# Patient Record
Sex: Female | Born: 1945 | Race: White | Hispanic: No | Marital: Married | Smoking: Former smoker
Health system: Southern US, Community
[De-identification: ages and names within clinical notes are randomized; demographics above are authoritative.]

## PROBLEM LIST (undated history)

## (undated) DIAGNOSIS — C50919 Malignant neoplasm of unspecified site of unspecified female breast: Secondary | ICD-10-CM

## (undated) DIAGNOSIS — L719 Rosacea, unspecified: Secondary | ICD-10-CM

## (undated) DIAGNOSIS — E785 Hyperlipidemia, unspecified: Secondary | ICD-10-CM

## (undated) DIAGNOSIS — M810 Age-related osteoporosis without current pathological fracture: Secondary | ICD-10-CM

## (undated) DIAGNOSIS — Z9221 Personal history of antineoplastic chemotherapy: Secondary | ICD-10-CM

## (undated) DIAGNOSIS — Z923 Personal history of irradiation: Secondary | ICD-10-CM

## (undated) HISTORY — DX: Malignant neoplasm of unspecified site of unspecified female breast: C50.919

## (undated) HISTORY — DX: Age-related osteoporosis without current pathological fracture: M81.0

## (undated) HISTORY — DX: Hyperlipidemia, unspecified: E78.5

## (undated) HISTORY — PX: TUBAL LIGATION: SHX77

---

## 1997-07-05 HISTORY — PX: SIGMOIDOSCOPY: SUR1295

## 1998-11-19 ENCOUNTER — Other Ambulatory Visit: Admission: RE | Admit: 1998-11-19 | Discharge: 1998-11-19 | Payer: Self-pay | Admitting: *Deleted

## 2000-10-21 ENCOUNTER — Other Ambulatory Visit: Admission: RE | Admit: 2000-10-21 | Discharge: 2000-10-21 | Payer: Self-pay | Admitting: *Deleted

## 2000-11-25 ENCOUNTER — Encounter: Admission: RE | Admit: 2000-11-25 | Discharge: 2000-11-25 | Payer: Self-pay | Admitting: *Deleted

## 2003-02-22 ENCOUNTER — Encounter: Payer: Self-pay | Admitting: Family Medicine

## 2003-02-22 ENCOUNTER — Encounter: Admission: RE | Admit: 2003-02-22 | Discharge: 2003-02-22 | Payer: Self-pay | Admitting: Family Medicine

## 2010-12-03 ENCOUNTER — Telehealth: Payer: Self-pay | Admitting: *Deleted

## 2010-12-03 ENCOUNTER — Encounter: Payer: Self-pay | Admitting: *Deleted

## 2010-12-03 NOTE — Telephone Encounter (Signed)
While speaking with pt's husband Jillyn Hidden, he stated since his experience with a COLON went well, his wife would like to have a screening COLON. OK with Dr Jarold Motto to schedule.

## 2011-01-25 ENCOUNTER — Ambulatory Visit (AMBULATORY_SURGERY_CENTER): Payer: Managed Care, Other (non HMO) | Admitting: *Deleted

## 2011-01-25 VITALS — Ht 64.0 in | Wt 143.0 lb

## 2011-01-25 DIAGNOSIS — Z1211 Encounter for screening for malignant neoplasm of colon: Secondary | ICD-10-CM

## 2011-01-25 MED ORDER — PEG-KCL-NACL-NASULF-NA ASC-C 100 G PO SOLR: ORAL | Status: DC

## 2011-01-26 ENCOUNTER — Encounter: Payer: Self-pay | Admitting: Gastroenterology

## 2011-02-01 ENCOUNTER — Encounter: Payer: Self-pay | Admitting: Gastroenterology

## 2011-02-01 ENCOUNTER — Ambulatory Visit (AMBULATORY_SURGERY_CENTER): Payer: Managed Care, Other (non HMO) | Admitting: Gastroenterology

## 2011-02-01 VITALS — BP 145/80 | HR 75 | Temp 98.1°F | Resp 16 | Ht 64.0 in | Wt 142.0 lb

## 2011-02-01 DIAGNOSIS — D126 Benign neoplasm of colon, unspecified: Secondary | ICD-10-CM

## 2011-02-01 DIAGNOSIS — Z1211 Encounter for screening for malignant neoplasm of colon: Secondary | ICD-10-CM

## 2011-02-01 MED ORDER — SODIUM CHLORIDE 0.9 % IV SOLN: 500.0000 mL | INTRAVENOUS | Status: DC

## 2011-02-01 NOTE — Patient Instructions (Signed)
[employer] reviewed with patient and care partner.  [facility] Discharge Instructions signed by care partner.  Impressions/Recommendations:  Polyp  Repeat colonoscopy in 5-10 years, depending on pathology.  Resume medications as you had been taking prior to your procedure.

## 2011-02-02 ENCOUNTER — Telehealth: Payer: Self-pay

## 2011-02-02 NOTE — Telephone Encounter (Signed)
Left message on answering machine. 

## 2011-02-05 ENCOUNTER — Encounter: Payer: Self-pay | Admitting: Gastroenterology

## 2013-04-17 ENCOUNTER — Other Ambulatory Visit: Payer: Self-pay | Admitting: Dermatology

## 2013-06-06 ENCOUNTER — Other Ambulatory Visit (HOSPITAL_COMMUNITY): Payer: Self-pay | Admitting: Family Medicine

## 2013-06-06 DIAGNOSIS — Z1231 Encounter for screening mammogram for malignant neoplasm of breast: Secondary | ICD-10-CM

## 2013-06-08 ENCOUNTER — Other Ambulatory Visit (HOSPITAL_COMMUNITY): Payer: Self-pay | Admitting: Family Medicine

## 2013-06-08 DIAGNOSIS — Z780 Asymptomatic menopausal state: Secondary | ICD-10-CM

## 2013-06-19 ENCOUNTER — Ambulatory Visit (HOSPITAL_COMMUNITY): Payer: Managed Care, Other (non HMO)

## 2013-06-26 ENCOUNTER — Ambulatory Visit (HOSPITAL_COMMUNITY)
Admission: RE | Admit: 2013-06-26 | Discharge: 2013-06-26 | Disposition: A | Discharge status: Home / Self Care | Payer: BC Managed Care – PPO | Source: Ambulatory Visit | Attending: Family Medicine | Admitting: Family Medicine

## 2013-06-26 DIAGNOSIS — Z1231 Encounter for screening mammogram for malignant neoplasm of breast: Secondary | ICD-10-CM | POA: Insufficient documentation

## 2013-06-26 DIAGNOSIS — Z780 Asymptomatic menopausal state: Secondary | ICD-10-CM | POA: Insufficient documentation

## 2013-06-26 DIAGNOSIS — Z13820 Encounter for screening for osteoporosis: Secondary | ICD-10-CM | POA: Insufficient documentation

## 2015-06-16 ENCOUNTER — Other Ambulatory Visit: Payer: Self-pay | Admitting: Family Medicine

## 2015-06-16 DIAGNOSIS — M810 Age-related osteoporosis without current pathological fracture: Secondary | ICD-10-CM

## 2015-07-24 ENCOUNTER — Ambulatory Visit
Admission: RE | Admit: 2015-07-24 | Discharge: 2015-07-24 | Disposition: A | Discharge status: Home / Self Care | Payer: Medicare Other | Source: Ambulatory Visit | Attending: Family Medicine | Admitting: Family Medicine

## 2015-07-24 DIAGNOSIS — M810 Age-related osteoporosis without current pathological fracture: Secondary | ICD-10-CM

## 2015-09-18 DIAGNOSIS — M8000XD Age-related osteoporosis with current pathological fracture, unspecified site, subsequent encounter for fracture with routine healing: Secondary | ICD-10-CM | POA: Insufficient documentation

## 2015-09-18 DIAGNOSIS — E7800 Pure hypercholesterolemia, unspecified: Secondary | ICD-10-CM | POA: Insufficient documentation

## 2016-01-26 ENCOUNTER — Encounter: Payer: Self-pay | Admitting: Internal Medicine

## 2016-03-02 ENCOUNTER — Encounter: Payer: Self-pay | Admitting: Internal Medicine

## 2016-03-25 ENCOUNTER — Ambulatory Visit (AMBULATORY_SURGERY_CENTER): Payer: Self-pay

## 2016-03-25 VITALS — Ht 64.0 in | Wt 129.8 lb

## 2016-03-25 DIAGNOSIS — Z860100 Personal history of colon polyps, unspecified: Secondary | ICD-10-CM

## 2016-03-25 MED ORDER — SUPREP BOWEL PREP KIT 17.5-3.13-1.6 GM/177ML PO SOLN: 1.0000 | Freq: Once | ORAL | 0 refills | Status: AC

## 2016-03-25 NOTE — Progress Notes (Signed)
No allergies to eggs or soy No past problems with anesthesia No diet meds No home oxygen  Has email and internet; declined emmi 

## 2016-04-07 ENCOUNTER — Ambulatory Visit (AMBULATORY_SURGERY_CENTER): Payer: Medicare Other | Admitting: Internal Medicine

## 2016-04-07 ENCOUNTER — Encounter: Payer: Self-pay | Admitting: Internal Medicine

## 2016-04-07 VITALS — BP 122/68 | HR 69 | Temp 97.1°F | Resp 14 | Ht 64.0 in | Wt 129.0 lb

## 2016-04-07 DIAGNOSIS — D125 Benign neoplasm of sigmoid colon: Secondary | ICD-10-CM

## 2016-04-07 DIAGNOSIS — K635 Polyp of colon: Secondary | ICD-10-CM

## 2016-04-07 DIAGNOSIS — D124 Benign neoplasm of descending colon: Secondary | ICD-10-CM | POA: Diagnosis not present

## 2016-04-07 DIAGNOSIS — Z86010 Personal history of colonic polyps: Secondary | ICD-10-CM | POA: Diagnosis not present

## 2016-04-07 MED ORDER — SODIUM CHLORIDE 0.9 % IV SOLN: 500.0000 mL | INTRAVENOUS | Status: DC

## 2016-04-07 NOTE — Progress Notes (Signed)
Called to room for pathology. 

## 2016-04-07 NOTE — Op Note (Signed)
[facility] Patient Name: Katherine Donovan Procedure Date: 04/07/2016 1:36 PM MRN: [id number] Endoscopist: Jerene Bears , MD Age: 70 Referring MD:  Date of Birth: 1945/09/01 Gender: Female Account #: [id number] Procedure:                Colonoscopy Indications:              Surveillance: Personal history of adenomatous                            polyps on last colonoscopy 5 years ago Medicines:                Monitored Anesthesia Care Procedure:                Pre-Anesthesia Assessment:                           - Prior to the procedure, a History and Physical                            was performed, and patient medications and                            allergies were reviewed. The patient's tolerance of                            previous anesthesia was also reviewed. The risks                            and benefits of the procedure and the sedation                            options and risks were discussed with the patient.                            All questions were answered, and informed consent                            was obtained. Prior Anticoagulants: The patient has                            taken no previous anticoagulant or antiplatelet                            agents. ASA Grade Assessment: II - A patient with                            mild systemic disease. After reviewing the risks                            and benefits, the patient was deemed in                            satisfactory condition to undergo the procedure.  After obtaining informed consent, the colonoscope                            was passed under direct vision. Throughout the                            procedure, the patient's blood pressure, pulse, and                            oxygen saturations were monitored continuously. The                            [id number]) was introduced through the anus                            and advanced to the the  cecum, identified by                            appendiceal orifice and ileocecal valve. The                            colonoscopy was performed without difficulty. The                            patient tolerated the procedure well. The quality                            of the bowel preparation was good. The ileocecal                            valve, appendiceal orifice, and rectum were                            photographed. Scope In: 1:38:27 PM Scope Out: 1:55:07 PM Scope Withdrawal Time: 0 hours 12 minutes 37 seconds  Total Procedure Duration: 0 hours 16 minutes 40 seconds  Findings:                 The digital rectal exam was normal.                           A 6 mm polyp was found in the descending colon. The                            polyp was sessile. The polyp was removed with a                            cold snare. Resection and retrieval were complete.                           A 3 mm polyp was found in the sigmoid colon. The                            polyp was sessile. The polyp  was removed with a                            cold snare. Resection and retrieval were complete.                           Internal hemorrhoids and hypertrophied anal                            papillae were found during retroflexion. The                            hemorrhoids were small. Complications:            No immediate complications. Estimated Blood Loss:     Estimated blood loss: none. Impression:               - One 6 mm polyp in the descending colon, removed                            with a cold snare. Resected and retrieved.                           - One 3 mm polyp in the sigmoid colon, removed with                            a cold snare. Resected and retrieved.                           - Internal hemorrhoids and hypertrophied anal                            papillae. Recommendation:           - Patient has a contact number available for                            emergencies.  The signs and symptoms of potential                            delayed complications were discussed with the                            patient. Return to normal activities tomorrow.                            Written discharge instructions were provided to the                            patient.                           - Resume previous diet.                           - Continue present medications.                           -  Await pathology results.                           - Repeat colonoscopy is recommended for                            surveillance. The colonoscopy date will be                            determined after pathology results from today's                            exam become available for review. Jerene Bears, MD 04/07/2016 2:04:50 PM This report has been signed electronically.

## 2016-04-07 NOTE — Progress Notes (Signed)
Report to PACU, RN, vss, BBS= Clear.  

## 2016-04-07 NOTE — Patient Instructions (Signed)
YOU HAD AN ENDOSCOPIC PROCEDURE TODAY AT THE [facility]:   Refer to the procedure report that was given to you for any specific questions about what was found during the examination.  If the procedure report does not answer your questions, please call your gastroenterologist to clarify.  If you requested that your care partner not be given the details of your procedure findings, then the procedure report has been included in a sealed envelope for you to review at your convenience later.  YOU SHOULD EXPECT: Some feelings of bloating in the abdomen. Passage of more gas than usual.  Walking can help get rid of the air that was put into your GI tract during the procedure and reduce the bloating. If you had a lower endoscopy (such as a colonoscopy or flexible sigmoidoscopy) you may notice spotting of blood in your stool or on the toilet paper. If you underwent a bowel prep for your procedure, you may not have a normal bowel movement for a few days.  Please Note:  You might notice some irritation and congestion in your nose or some drainage.  This is from the oxygen used during your procedure.  There is no need for concern and it should clear up in a day or so.  SYMPTOMS TO REPORT IMMEDIATELY:   Following lower endoscopy (colonoscopy or flexible sigmoidoscopy):  Excessive amounts of blood in the stool  Significant tenderness or worsening of abdominal pains  Swelling of the abdomen that is new, acute  Fever of 100F or higher   For urgent or emergent issues, a gastroenterologist can be reached at any hour by calling [phone].   DIET:  We do recommend a small meal at first, but then you may proceed to your regular diet.  Drink plenty of fluids but you should avoid alcoholic beverages for 24 hours.  ACTIVITY:  You should plan to take it easy for the rest of today and you should NOT DRIVE or use heavy machinery until tomorrow (because of the sedation medicines used during the test).     FOLLOW UP: Our staff will call the number listed on your records the next business day following your procedure to check on you and address any questions or concerns that you may have regarding the information given to you following your procedure. If we do not reach you, we will leave a message.  However, if you are feeling well and you are not experiencing any problems, there is no need to return our call.  We will assume that you have returned to your regular daily activities without incident.  If any biopsies were taken you will be contacted by phone or by letter within the next 1-3 weeks.  Please call us at [phone] if you have not heard about the biopsies in 3 weeks.    SIGNATURES/CONFIDENTIALITY: You and/or your care partner have signed paperwork which will be entered into your electronic medical record.  These signatures attest to the fact that that the information above on your After Visit Summary has been reviewed and is understood.  Full responsibility of the confidentiality of this discharge information lies with you and/or your care-partner.  Read all handouts given to you by your recovery room nurse.   Thank-you for choosing us for your healthcare needs today. 

## 2016-04-08 ENCOUNTER — Telehealth: Payer: Self-pay | Admitting: *Deleted

## 2016-04-08 NOTE — Telephone Encounter (Signed)
  Follow up Call-  Call back number 04/07/2016  Post procedure Call Back phone  # [phone]  Permission to leave phone message Yes  Some recent data might be hidden     Patient questions:  Do you have a fever, pain , or abdominal swelling? No. Pain Score  0 *  Have you tolerated food without any problems? Yes.    Have you been able to return to your normal activities? Yes.    Do you have any questions about your discharge instructions: Diet   No. Medications  No. Follow up visit  No.  Do you have questions or concerns about your Care? No.  Actions: * If pain score is 4 or above: No action needed, pain <4.

## 2016-04-16 ENCOUNTER — Encounter: Payer: Self-pay | Admitting: Internal Medicine

## 2017-05-09 ENCOUNTER — Other Ambulatory Visit: Payer: Self-pay | Admitting: Family Medicine

## 2017-05-09 DIAGNOSIS — M810 Age-related osteoporosis without current pathological fracture: Secondary | ICD-10-CM

## 2017-05-09 DIAGNOSIS — Z1231 Encounter for screening mammogram for malignant neoplasm of breast: Secondary | ICD-10-CM

## 2017-08-24 ENCOUNTER — Ambulatory Visit
Admission: RE | Admit: 2017-08-24 | Discharge: 2017-08-24 | Disposition: A | Discharge status: Home / Self Care | Payer: Medicare Other | Source: Ambulatory Visit | Attending: Family Medicine | Admitting: Family Medicine

## 2017-08-24 DIAGNOSIS — M810 Age-related osteoporosis without current pathological fracture: Secondary | ICD-10-CM

## 2017-08-24 DIAGNOSIS — Z1231 Encounter for screening mammogram for malignant neoplasm of breast: Secondary | ICD-10-CM

## 2018-01-24 ENCOUNTER — Other Ambulatory Visit: Payer: Self-pay | Admitting: Family Medicine

## 2018-01-24 ENCOUNTER — Ambulatory Visit
Admission: RE | Admit: 2018-01-24 | Discharge: 2018-01-24 | Disposition: A | Discharge status: Home / Self Care | Payer: Medicare Other | Source: Ambulatory Visit | Attending: Family Medicine | Admitting: Family Medicine

## 2018-01-24 DIAGNOSIS — M810 Age-related osteoporosis without current pathological fracture: Secondary | ICD-10-CM

## 2020-02-08 ENCOUNTER — Other Ambulatory Visit: Payer: Self-pay | Admitting: Family Medicine

## 2020-02-08 DIAGNOSIS — Z1231 Encounter for screening mammogram for malignant neoplasm of breast: Secondary | ICD-10-CM

## 2020-02-08 DIAGNOSIS — M810 Age-related osteoporosis without current pathological fracture: Secondary | ICD-10-CM

## 2020-04-01 ENCOUNTER — Other Ambulatory Visit: Payer: Self-pay

## 2020-04-01 ENCOUNTER — Encounter: Payer: Self-pay | Admitting: Cardiology

## 2020-04-01 ENCOUNTER — Ambulatory Visit: Payer: Medicare Other | Admitting: Cardiology

## 2020-04-01 VITALS — BP 150/98 | HR 73 | Resp 17 | Ht 64.0 in | Wt 124.0 lb

## 2020-04-01 DIAGNOSIS — Z87891 Personal history of nicotine dependence: Secondary | ICD-10-CM

## 2020-04-01 DIAGNOSIS — E7800 Pure hypercholesterolemia, unspecified: Secondary | ICD-10-CM

## 2020-04-01 DIAGNOSIS — Z8249 Family history of ischemic heart disease and other diseases of the circulatory system: Secondary | ICD-10-CM

## 2020-04-01 DIAGNOSIS — R072 Precordial pain: Secondary | ICD-10-CM

## 2020-04-01 NOTE — Progress Notes (Signed)
Date:  04/01/2020   ID:  Katherine Donovan, DOB [birth year], MRN [id number]  PCP:  Nickola Major, MD  Cardiologist:  Rex Kras, DO, [facility] (established care 04/01/2020)  REASON FOR CONSULT: Systolic murmur and chest pressure.   REQUESTING PHYSICIAN:  Nickola Major, MD [address] [place],  [place] [address]  Chief Complaint  Patient presents with  . Chest Pain  . Heart Murmur  . New Patient (Initial Visit)    HPI  Katherine Donovan is a 74 y.o. female who presents to the office with a chief complaint of " chest pressure." Patient's past medical history and cardiovascular risk factors include: osteoporosis, hyperlipidemia, former smoker, postmenopausal female, advance age.   She is referred to the office at the request of Nickola Major, MD for evaluation of systolic murmur and chest pressure.  Patient recently had her yearly physical with her PCP several weeks ago and mentioned symptoms of mild chest pressure.  Patient states that her last episode was about 1 month ago while she was washing dishes, lasted for couple minutes, intensity 2 out of 10, self-limited.  Symptoms are not brought on by effort related activities and does not resolve with rest.  Symptoms are associated with difficulty with breathing.   Family history of premature coronary disease (sister had a PCI in her early 6s). Another sister died in her 62s due to "poor life-style" (obesity 350 pounds, alcohol use, etc).   Denies prior history of coronary artery disease, myocardial infarction, congestive heart failure, deep venous thrombosis, pulmonary embolism, stroke, transient ischemic attack.  FUNCTIONAL STATUS: Elliptical 45mns 3-4 x per week.    ALLERGIES: No Known Allergies  MEDICATION LIST PRIOR TO VISIT: Current Meds  Medication Sig  . alendronate (FOSAMAX) 70 MG tablet Take 70 mg by mouth once a week. Take with a full glass of water on an empty stomach.  . Calcium Carbonate-Vitamin D  (CALCIUM 600+D) 600-400 MG-UNIT per tablet Take 1 tablet by mouth daily.    . CRESTOR 10 MG tablet Take 5 mg by mouth Daily.  . Glucosamine 750 MG TABS Take 750 mg by mouth daily.    . Multiple Vitamins-Minerals (MULTIVITAMIN WITH MINERALS) tablet Take 1 tablet by mouth daily.     PAST MEDICAL HISTORY: Past Medical History:  Diagnosis Date  . Hyperlipidemia   . Osteoporosis     PAST SURGICAL HISTORY: Past Surgical History:  Procedure Laterality Date  . SIGMOIDOSCOPY  1999  . TUBAL LIGATION      FAMILY HISTORY: The patient family history includes Breast cancer in her sister; Cancer in her mother; Heart attack in her father; Hypertension in her father; Obesity in her sister.  SOCIAL HISTORY:  The patient  reports that she quit smoking about 52 years ago. Her smoking use included cigarettes. She has a 0.13 pack-year smoking history. She has never used smokeless tobacco. She reports current alcohol use of about 2.0 standard drinks of alcohol per week. She reports that she does not use drugs.  REVIEW OF SYSTEMS: Review of Systems  Constitutional: Negative for chills and fever.  HENT: Negative for hoarse voice and nosebleeds.   Eyes: Negative for discharge, double vision and pain.  Cardiovascular: Positive for chest pain (see above.). Negative for claudication, dyspnea on exertion, leg swelling, near-syncope, orthopnea, palpitations, paroxysmal nocturnal dyspnea and syncope.  Respiratory: Negative for hemoptysis and shortness of breath.   Musculoskeletal: Negative for muscle cramps and myalgias.  Gastrointestinal: Negative for abdominal pain, constipation,  diarrhea, hematemesis, hematochezia, melena, nausea and vomiting.  Neurological: Negative for dizziness and light-headedness.    PHYSICAL EXAM: Vitals with BMI 04/01/2020 04/07/2016 04/07/2016  Height _0  - -  Weight 124 lbs - -  BMI 23.55 - -  Systolic 732 202 542  Diastolic 98 68 58  Pulse 73 - -   CONSTITUTIONAL:  Well-developed and well-nourished. No acute distress.  SKIN: Skin is warm and dry. No rash noted. No cyanosis. No pallor. No jaundice HEAD: Normocephalic and atraumatic.  EYES: No scleral icterus MOUTH/THROAT: Moist oral membranes.  NECK: No JVD present. No thyromegaly noted. No carotid bruits  LYMPHATIC: No visible cervical adenopathy.  CHEST Normal respiratory effort. No intercostal retractions  LUNGS: Clear to auscultation bilaterally. No stridor. No wheezes. No rales.  CARDIOVASCULAR: Regular rate and rhythm, positive H0-W2, soft systolic murmur at apex, no rubs or gallops appreciated. ABDOMINAL: No apparent ascites.  EXTREMITIES: No peripheral edema. +2 bilateral DP and PT.  HEMATOLOGIC: No significant bruising NEUROLOGIC: Oriented to person, place, and time. Nonfocal. Normal muscle tone.  PSYCHIATRIC: Normal mood and affect. Normal behavior. Cooperative.  CARDIAC DATABASE: EKG: 04/01/2020:Sinus  Rhythm, normal axis, old anteroseptal infarct, PRWP, nonspecific T-abnormality.   Echocardiogram: No results found for this or any previous visit from the past 1095 days.   Stress Testing: No results found for this or any previous visit from the past 1095 days. 30-35 years ago outside facility.   Heart Catheterization: None  LABORATORY DATA: External Labs: Collected: 03/19/2020 Hemoglobin 15.1 g/dL. Creatinine 0.65 mg/dL. eGFR: 89 mL/min per 1.73 m Potassium 4.6, magnesium 2.1 Lipid profile: Total cholesterol 196, triglycerides 119, HDL 62, LDL 113, non-HDL 134  IMPRESSION:    ICD-10-CM   1. Precordial pain  R07.2 EKG 12-Lead    PCV ECHOCARDIOGRAM COMPLETE    PCV MYOCARDIAL PERFUSION WO LEXISCAN    SARS-COV-2 RNA,(COVID-19) QUAL NAAT  2. Pure hypercholesterolemia  E78.00   3. Family history of premature CAD  Z82.49   4. Former smoker  Z87.891      RECOMMENDATIONS: Katherine Donovan is a 74 y.o. female whose past medical history and cardiac risk factors include:  osteoporosis, hyperlipidemia, former smoker, postmenopausal female, advance age.   Precordial chest pain:  Patient symptoms of chest discomfort appear to be atypical in nature but she has multiple cardiovascular risk factors and would benefit from an ischemic evaluation.  EKG shows normal sinus rhythm with old anteroseptal infarct without underlying injury pattern.  Echocardiogram will be ordered to evaluate for structural heart disease and left ventricular systolic function.  Exercise nuclear stress test recommended to evaluate for reversible ischemia.  Patient has been vaccinated for COVID-19.  Hyperlipidemia: Currently on statin therapy.  Most recent lipid profile reviewed.  Currently managed by PCP.  Elevated blood pressures without diagnosis of hypertension:  Patient is office blood pressure was elevated at today's office visit.  She states that her home blood pressures usually run around 130/70 mmHg.  Patient is asked to keep a log of her blood pressures and to review it with her PCP and myself during the next encounter.  Low-salt diet recommended.  Former smoker: Educated on the importance of continued smoking cessation.  FINAL MEDICATION LIST END OF ENCOUNTER: No orders of the defined types were placed in this encounter.    Current Outpatient Medications:  .  alendronate (FOSAMAX) 70 MG tablet, Take 70 mg by mouth once a week. Take with a full glass of water on an empty stomach., Disp: , Rfl:  .  Calcium Carbonate-Vitamin D (CALCIUM 600+D) 600-400 MG-UNIT per tablet, Take 1 tablet by mouth daily.  , Disp: , Rfl:  .  CRESTOR 10 MG tablet, Take 5 mg by mouth Daily., Disp: , Rfl:  .  Glucosamine 750 MG TABS, Take 750 mg by mouth daily.  , Disp: , Rfl:  .  Multiple Vitamins-Minerals (MULTIVITAMIN WITH MINERALS) tablet, Take 1 tablet by mouth daily.  , Disp: , Rfl:   Orders Placed This Encounter  Procedures  . SARS-COV-2 RNA,(COVID-19) QUAL NAAT  . PCV MYOCARDIAL PERFUSION WO  LEXISCAN  . EKG 12-Lead  . PCV ECHOCARDIOGRAM COMPLETE   There are no Patient Instructions on file for this visit.   --Continue cardiac medications as reconciled in final medication list. --Return in about 4 weeks (around 04/29/2020) for Review test results, Reevaluation of symptoms. Or sooner if needed. --Continue follow-up with your primary care physician regarding the management of your other chronic comorbid conditions.  Patient's questions and concerns were addressed to her satisfaction. She voices understanding of the instructions provided during this encounter.   This note was created using a voice recognition software as a result there may be grammatical errors inadvertently enclosed that do not reflect the nature of this encounter. Every attempt is made to correct such errors.  Rex Kras, [place], [facility]  Pager: [phone] Office: [phone]

## 2020-04-03 ENCOUNTER — Ambulatory Visit: Payer: Medicare Other

## 2020-04-03 ENCOUNTER — Other Ambulatory Visit: Payer: Self-pay

## 2020-04-03 DIAGNOSIS — R072 Precordial pain: Secondary | ICD-10-CM

## 2020-04-08 ENCOUNTER — Telehealth: Payer: Self-pay

## 2020-04-08 ENCOUNTER — Other Ambulatory Visit: Payer: Self-pay

## 2020-04-08 DIAGNOSIS — R072 Precordial pain: Secondary | ICD-10-CM

## 2020-04-08 NOTE — Telephone Encounter (Signed)
Unable to reach pt regarding echo results. Left vm to cb.

## 2020-04-08 NOTE — Telephone Encounter (Signed)
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 Details will be reviewed at the next office visit.

## 2020-04-09 ENCOUNTER — Other Ambulatory Visit: Payer: Self-pay

## 2020-04-09 ENCOUNTER — Ambulatory Visit: Payer: Medicare Other

## 2020-04-29 ENCOUNTER — Ambulatory Visit: Payer: Medicare Other | Admitting: Cardiology

## 2020-04-29 ENCOUNTER — Encounter: Payer: Self-pay | Admitting: Cardiology

## 2020-04-29 ENCOUNTER — Other Ambulatory Visit: Payer: Self-pay

## 2020-04-29 VITALS — BP 161/91 | HR 72 | Ht 64.0 in | Wt 124.0 lb

## 2020-04-29 DIAGNOSIS — Z712 Person consulting for explanation of examination or test findings: Secondary | ICD-10-CM

## 2020-04-29 DIAGNOSIS — R072 Precordial pain: Secondary | ICD-10-CM

## 2020-04-29 DIAGNOSIS — E7800 Pure hypercholesterolemia, unspecified: Secondary | ICD-10-CM

## 2020-04-29 DIAGNOSIS — Z8249 Family history of ischemic heart disease and other diseases of the circulatory system: Secondary | ICD-10-CM

## 2020-04-29 NOTE — Progress Notes (Signed)
 ID:  Katherine Donovan, DOB 06/11/1946, MRN [id number]  PCP:  Eksir, Samantha A, MD  Cardiologist:  Sunit Tolia, DO, FACC (established care 04/01/2020)  Date: 04/29/2020 Last Office Visit: 04/01/2020  Chief Complaint  Patient presents with  . Chest Pain  . Follow-up  . Results    HPI  Katherine Donovan is a 74 y.o. female who presents to the office with a chief complaint of " reevaluation of chest pressure and review test results." Patient's past medical history and cardiovascular risk factors include: osteoporosis, hyperlipidemia, postmenopausal female, advance age.   She is referred to the office at the request of Eksir, Samantha A, MD for evaluation of systolic murmur and chest pressure.  Patient recently had her yearly physical with her PCP several weeks ago and mentioned symptoms of mild chest pressure.  Last office visit has been present since then were noted to be atypical in nature.  However, given her age and multiple cardiovascular risk factors shared decision was to proceed with echocardiogram and stress test.  Echocardiogram notes preserved LVEF without any significant valvular heart disease and stress test overall low risk study.  These findings were discussed with the patient and her husband at today's office visit.  From a clinical standpoint patient states that she no longer has chest pain or pressure since her last visit.  She did keep a log of her blood pressures as recommended.  Her morning blood pressures usually range between 120-152 mmHg and diastolic blood pressure less than 80 mmHg.  And her evening blood pressures are around 120 mmHg and diastolic blood pressures less than 75 mmHg.  I did inform her that her blood pressures are elevated at today's office visit and she also had hypertensive response to exercise and stress test.  Family history of premature coronary disease (sister had a PCI in her early 50s). Another sister died in her 50s due to "poor life-style"  (obesity 350 pounds, alcohol use, etc).   Denies prior history of coronary artery disease, myocardial infarction, congestive heart failure, deep venous thrombosis, pulmonary embolism, stroke, transient ischemic attack.  FUNCTIONAL STATUS: Elliptical 15mins 3-4 x per week.    ALLERGIES: No Known Allergies  MEDICATION LIST PRIOR TO VISIT: Current Meds  Medication Sig  . alendronate (FOSAMAX) 70 MG tablet Take 70 mg by mouth once a week. Take with a full glass of water on an empty stomach.  . Calcium Carbonate-Vitamin D (CALCIUM 600+D) 600-400 MG-UNIT per tablet Take 1 tablet by mouth daily.    . CRESTOR 10 MG tablet Take 5 mg by mouth Daily.  . Glucosamine 750 MG TABS Take 750 mg by mouth daily.    . Lactobacillus Rhamnosus, GG, (RA PROBIOTIC DIGESTIVE CARE) CAPS Take 1 capsule by mouth 3 (three) times a week.  . Multiple Vitamin (MULTI-VITAMIN) tablet Take 1 tablet by mouth daily.  . [DISCONTINUED] Multiple Vitamins-Minerals (MULTIVITAMIN WITH MINERALS) tablet Take 1 tablet by mouth daily.     PAST MEDICAL HISTORY: Past Medical History:  Diagnosis Date  . Hyperlipidemia   . Osteoporosis     PAST SURGICAL HISTORY: Past Surgical History:  Procedure Laterality Date  . SIGMOIDOSCOPY  1999  . TUBAL LIGATION      FAMILY HISTORY: The patient family history includes Breast cancer in her sister; Cancer in her mother; Heart attack in her father; Hypertension in her father; Obesity in her sister.  SOCIAL HISTORY:  The patient  reports that she quit smoking about 52 years ago. Her smoking   use included cigarettes. She has a 0.13 pack-year smoking history. She has never used smokeless tobacco. She reports current alcohol use of about 2.0 standard drinks of alcohol per week. She reports that she does not use drugs.  REVIEW OF SYSTEMS: Review of Systems  Constitutional: Negative for chills and fever.  HENT: Negative for hoarse voice and nosebleeds.   Eyes: Negative for discharge, double  vision and pain.  Cardiovascular: Negative for chest pain, claudication, dyspnea on exertion, leg swelling, near-syncope, orthopnea, palpitations, paroxysmal nocturnal dyspnea and syncope.  Respiratory: Negative for hemoptysis and shortness of breath.   Musculoskeletal: Negative for muscle cramps and myalgias.  Gastrointestinal: Negative for abdominal pain, constipation, diarrhea, hematemesis, hematochezia, melena, nausea and vomiting.  Neurological: Negative for dizziness and light-headedness.   PHYSICAL EXAM: Vitals with BMI 04/29/2020 04/29/2020 04/01/2020  Height - 5' 4" 5' 4"  Weight - 124 lbs 124 lbs  BMI - 21.27 21.27  Systolic 161 168 150  Diastolic 91 89 98  Pulse 72 80 73   CONSTITUTIONAL: Well-developed and well-nourished. No acute distress.  SKIN: Skin is warm and dry. No rash noted. No cyanosis. No pallor. No jaundice HEAD: Normocephalic and atraumatic.  EYES: No scleral icterus MOUTH/THROAT: Moist oral membranes.  NECK: No JVD present. No thyromegaly noted. No carotid bruits  LYMPHATIC: No visible cervical adenopathy.  CHEST Normal respiratory effort. No intercostal retractions  LUNGS: Clear to auscultation bilaterally. No stridor. No wheezes. No rales.  CARDIOVASCULAR: Regular rate and rhythm, positive S1-S2, soft systolic murmur at apex, no rubs or gallops appreciated. ABDOMINAL: No apparent ascites.  EXTREMITIES: No peripheral edema. +2 bilateral DP and PT.  HEMATOLOGIC: No significant bruising NEUROLOGIC: Oriented to person, place, and time. Nonfocal. Normal muscle tone.  PSYCHIATRIC: Normal mood and affect. Normal behavior. Cooperative.  CARDIAC DATABASE: EKG: 04/01/2020:Sinus  Rhythm, normal axis, old anteroseptal infarct, PRWP, nonspecific T-abnormality.   Echocardiogram: 04/03/2020:  Normal LV systolic function with visual EF 60-65%. Left ventricle cavity is normal in size. Normal global wall motion. Normal diastolic filling pattern, normal LAP. Mild left  ventricular hypertrophy.  Left atrial cavity is normal in size. The interatrial Septum is thin and mobile but appears to be intact by 2D and CF Doppler interrogation.  Mild (Grade I) mitral regurgitation.  Mild tricuspid regurgitation.  No prior study for comparison.    Stress Testing: Exercise tetrofosmin stress test 04/09/2020:  Normal ECG stress. The patient exercised for 4 minutes and 45 seconds of a Bruce protocol, achieving approximately 6.73 METs. Achieved 101% of MPHR. Hypertensive both at rest and stress.  Myocardial perfusion is normal.  Overall LV systolic function is normal without regional wall motion abnormalities. Stress LV EF: 83%.  No previous exam available for comparison. Low risk.   Heart Catheterization: None  LABORATORY DATA: External Labs: Collected: 03/19/2020 Hemoglobin 15.1 g/dL. Creatinine 0.65 mg/dL. eGFR: 89 mL/min per 1.73 m Potassium 4.6, magnesium 2.1 Lipid profile: Total cholesterol 196, triglycerides 119, HDL 62, LDL 113, non-HDL 134  IMPRESSION:    ICD-10-CM   1. Precordial pain  R07.2   2. Pure hypercholesterolemia  E78.00   3. Family history of premature CAD  Z82.49   4. Encounter to discuss test results  Z71.2      RECOMMENDATIONS: Latroya E Burningham is a 74 y.o. female whose past medical history and cardiac risk factors include: osteoporosis, hyperlipidemia, former smoker, postmenopausal female, advance age.   Precordial chest pain: Resolved  Since last visit she has not had any chest pain or pressure.    Reviewed the echocardiogram and stress test findings with the patient and her husband at today's visit.  Patient reencouraged more cardiovascular standpoint and educated on improving her modifiable cardiovascular risk factors.    Hyperlipidemia: Currently on statin therapy.  Most recent lipid profile reviewed.  Currently managed by PCP.  Elevated blood pressures without diagnosis of hypertension:  Patient's office blood pressure  readings have been elevated and she also had a hypertensive response to exercise.  As result she was recommended to keep a blood pressure log for review.  It appears that the patient's blood pressures and nonmedical environment and better controlled.  However her a.m. blood pressures are higher than her p.m. blood pressures.  Given her age would recommend a low-dose blood pressure pill during the evening hours to help regulate her a.m. blood pressures.  However, patient did not want to start any pharmacological therapy at this time and states that she will follow-up with her PCP.  Former smoker: Educated on the importance of continued smoking cessation.  Given her age and cardiovascular risk factors would recommend 1 year follow-up to reevaluate symptoms and address any questions that she may have.  Otherwise I will see her on as needed basis.   Dr. Eksir thank you for allowing me to participate in the care of this pleasant patient.  FINAL MEDICATION LIST END OF ENCOUNTER: No orders of the defined types were placed in this encounter.    Current Outpatient Medications:  .  alendronate (FOSAMAX) 70 MG tablet, Take 70 mg by mouth once a week. Take with a full glass of water on an empty stomach., Disp: , Rfl:  .  Calcium Carbonate-Vitamin D (CALCIUM 600+D) 600-400 MG-UNIT per tablet, Take 1 tablet by mouth daily.  , Disp: , Rfl:  .  CRESTOR 10 MG tablet, Take 5 mg by mouth Daily., Disp: , Rfl:  .  Glucosamine 750 MG TABS, Take 750 mg by mouth daily.  , Disp: , Rfl:  .  Lactobacillus Rhamnosus, GG, (RA PROBIOTIC DIGESTIVE CARE) CAPS, Take 1 capsule by mouth 3 (three) times a week., Disp: , Rfl:  .  Multiple Vitamin (MULTI-VITAMIN) tablet, Take 1 tablet by mouth daily., Disp: , Rfl:   No orders of the defined types were placed in this encounter.  There are no Patient Instructions on file for this visit.   --Continue cardiac medications as reconciled in final medication list. --Return in about  1 year (around 04/29/2021) for 1 yr follow to re-evaluate symptoms. . Or sooner if needed. --Continue follow-up with your primary care physician regarding the management of your other chronic comorbid conditions.  Patient's questions and concerns were addressed to her satisfaction. She voices understanding of the instructions provided during this encounter.   This note was created using a voice recognition software as a result there may be grammatical errors inadvertently enclosed that do not reflect the nature of this encounter. Every attempt is made to correct such errors.  Sunit Tolia, DO, FACC  Pager: [phone] Office: [phone]   

## 2020-05-14 ENCOUNTER — Other Ambulatory Visit: Payer: Self-pay

## 2020-05-14 ENCOUNTER — Ambulatory Visit
Admission: RE | Admit: 2020-05-14 | Discharge: 2020-05-14 | Disposition: A | Discharge status: Home / Self Care | Payer: Medicare Other | Source: Ambulatory Visit | Attending: Family Medicine | Admitting: Family Medicine

## 2020-05-14 DIAGNOSIS — M810 Age-related osteoporosis without current pathological fracture: Secondary | ICD-10-CM

## 2020-05-14 DIAGNOSIS — Z1231 Encounter for screening mammogram for malignant neoplasm of breast: Secondary | ICD-10-CM

## 2020-05-22 ENCOUNTER — Ambulatory Visit (INDEPENDENT_AMBULATORY_CARE_PROVIDER_SITE_OTHER): Payer: Medicare Other

## 2020-05-22 ENCOUNTER — Encounter: Payer: Self-pay | Admitting: Podiatry

## 2020-05-22 ENCOUNTER — Ambulatory Visit: Payer: Medicare Other

## 2020-05-22 ENCOUNTER — Ambulatory Visit (INDEPENDENT_AMBULATORY_CARE_PROVIDER_SITE_OTHER): Payer: Medicare Other | Admitting: Podiatry

## 2020-05-22 DIAGNOSIS — M779 Enthesopathy, unspecified: Secondary | ICD-10-CM

## 2020-05-22 DIAGNOSIS — M21619 Bunion of unspecified foot: Secondary | ICD-10-CM

## 2020-05-22 NOTE — Patient Instructions (Signed)
Bunion  A bunion is a bump on the base of the big toe that forms when the bones of the big toe joint move out of position. Bunions may be small at first, but they often get larger over time. They can make walking painful. What are the causes? A bunion may be caused by:  Wearing narrow or pointed shoes that force the big toe to press against the other toes.  Abnormal foot development that causes the foot to roll inward (pronate).  Changes in the foot that are caused by certain diseases, such as rheumatoid arthritis or polio.  A foot injury. What increases the risk? The following factors may make you more likely to develop this condition:  Wearing shoes that squeeze the toes together.  Having certain diseases, such as: ? Rheumatoid arthritis. ? Polio. ? Cerebral palsy.  Having family members who have bunions.  Being born with a foot deformity, such as flat feet or low arches.  Doing activities that put a lot of pressure on the feet, such as ballet dancing. What are the signs or symptoms? The main symptom of a bunion is a noticeable bump on the big toe. Other symptoms may include:  Pain.  Swelling around the big toe.  Redness and inflammation.  Thick or hardened skin on the big toe or between the toes.  Stiffness or loss of motion in the big toe.  Trouble with walking. How is this diagnosed? A bunion may be diagnosed based on your symptoms, medical history, and activities. You may have tests, such as:  X-rays. These allow your health care provider to check the position of the bones in your foot and look for damage to your joint. They also help your health care provider determine the severity of your bunion and the best way to treat it.  Joint aspiration. In this test, a sample of fluid is removed from the toe joint. This test may be done if you are in a lot of pain. It helps rule out diseases that cause painful swelling of the joints, such as arthritis. How is this  treated? Treatment depends on the severity of your symptoms. The goal of treatment is to relieve symptoms and prevent the bunion from getting worse. Your health care provider may recommend:  Wearing shoes that have a wide toe box.  Using bunion pads to cushion the affected area.  Taping your toes together to keep them in a normal position.  Placing a device inside your shoe (orthotics) to help reduce pressure on your toe joint.  Taking medicine to ease pain, inflammation, and swelling.  Applying heat or ice to the affected area.  Doing stretching exercises.  Surgery to remove scar tissue and move the toes back into their normal position. This treatment is rare. Follow these instructions at home: Managing pain, stiffness, and swelling   If directed, put ice on the painful area: ? Put ice in a plastic bag. ? Place a towel between your skin and the bag. ? Leave the ice on for 20 minutes, 2-3 times a day. Activity   If directed, apply heat to the affected area before you exercise. Use the heat source that your health care provider recommends, such as a moist heat pack or a heating pad. ? Place a towel between your skin and the heat source. ? Leave the heat on for 20-30 minutes. ? Remove the heat if your skin turns bright red. This is especially important if you are unable to feel pain,   heat, or cold. You may have a greater risk of getting burned.  Do exercises as told by your health care provider. General instructions  Support your toe joint with proper footwear, shoe padding, or taping as told by your health care provider.  Take over-the-counter and prescription medicines only as told by your health care provider.  Keep all follow-up visits as told by your health care provider. This is important. Contact a health care provider if your symptoms:  Get worse.  Do not improve in 2 weeks. Get help right away if you have:  Severe pain and trouble with walking. Summary  A  bunion is a bump on the base of the big toe that forms when the bones of the big toe joint move out of position.  Bunions can make walking painful.  Treatment depends on the severity of your symptoms.  Support your toe joint with proper footwear, shoe padding, or taping as told by your health care provider. This information is not intended to replace advice given to you by your health care provider. Make sure you discuss any questions you have with your health care provider. Document Revised: 12/26/2017 Document Reviewed: 11/01/2017 Elsevier Patient Education  2020 [employer]  

## 2020-05-22 NOTE — Progress Notes (Signed)
Subjective:   Patient ID: Katherine Donovan, female   DOB: 74 y.o.   MRN: [id number]   HPI Patient presents with painful bunion deformity left and states it is been inflamed and making shoe gear difficult and has been present at least 6 months with family history and grandmother most likely having severe deformity.  Patient states she is trying to change her shoe gear and soak without relief and does not smoke likes to be active   Review of Systems  All other systems reviewed and are negative.       Objective:  Physical Exam Vitals and nursing note reviewed.  Constitutional:      Appearance: She is well-developed.  Pulmonary:     Effort: Pulmonary effort is normal.  Musculoskeletal:        General: Normal range of motion.  Skin:    General: Skin is warm.  Neurological:     Mental Status: She is alert.     Neurovascular status intact muscle strength was found to be adequate inflammation fluid around the first MPJ left that is painful when pressed with bony deformity noted and moderate structural changes occurring with the big toe.  It is painful when palpated and patient was noted to have good digital perfusion and is well oriented x3     Assessment:  Acute inflammatory capsulitis around the first MPJ left with bone bunion deformity present     Plan:  H&P x-ray reviewed condition discussed.  At this point I did do sterile prep and injected the capsule 3 mg Dexasone Kenalog 5 mg Xylocaine and I then went ahead and discussed the structural deformity patient has and we reviewed bunion correction with probable distal osteotomy or removal of.  I educated her on procedure recovery and she will think about this but I do think most likely this will be necessary for her long-term.  I do think she could use topical medicines and we discussed continued shoe gear modifications and see the response to medication  X-rays indicate elevation of the intermetatarsal angle with moderate deviation  of the hallux against the second toe

## 2020-08-11 ENCOUNTER — Encounter: Payer: Self-pay | Admitting: Podiatry

## 2020-08-11 ENCOUNTER — Other Ambulatory Visit: Payer: Self-pay

## 2020-08-11 ENCOUNTER — Ambulatory Visit (INDEPENDENT_AMBULATORY_CARE_PROVIDER_SITE_OTHER): Payer: Medicare Other | Admitting: Podiatry

## 2020-08-11 DIAGNOSIS — M21619 Bunion of unspecified foot: Secondary | ICD-10-CM

## 2020-08-11 DIAGNOSIS — M21611 Bunion of right foot: Secondary | ICD-10-CM

## 2020-08-11 NOTE — Progress Notes (Signed)
Subjective:   Patient ID: Jacqlyn Larsen, female   DOB: 75 y.o.   MRN: [id number]   HPI Patient presents stating she is here to get her left bunion discussed and she is ready for correction which is scheduled for the next few weeks   ROS      Objective:  Physical Exam  Neurovascular status intact negative Bevelyn Buckles' sign noted left first metatarsal showing prominence and redness with pain around the first metatarsal head     Assessment:  Chronic HAV deformity left     Plan:  H&P x-ray reviewed with patient.  I have recommended distal osteotomy left and I explained procedure risk and patient wants surgery understanding risk.  Patient at this point signed consent form with all instructions given to patient and we reviewed alternative treatments complications that can be done and patient is willing to accept risk and signed consent form after extensive review and is scheduled for outpatient surgery.  Patient is encouraged to call questions concerns which may arise and understands total recovery can take upwards of 6 months and I dispensed air fracture walker today for the postoperative.  And I want her to get used to it and educated well prior to the procedure

## 2020-09-01 MED ORDER — ONDANSETRON HCL 4 MG PO TABS: 4.0000 mg | ORAL_TABLET | Freq: Three times a day (TID) | ORAL | 0 refills | Status: DC | PRN

## 2020-09-01 MED ORDER — OXYCODONE-ACETAMINOPHEN 10-325 MG PO TABS
1.0000 | ORAL_TABLET | ORAL | 0 refills | Status: DC | PRN
Start: 2020-09-01 — End: 2020-11-03

## 2020-09-01 NOTE — Addendum Note (Signed)
Addended by: Wallene Huh on: 09/01/2020 01:46 PM   Modules accepted: Orders

## 2020-09-02 ENCOUNTER — Encounter: Payer: Self-pay | Admitting: Podiatry

## 2020-09-02 DIAGNOSIS — M2012 Hallux valgus (acquired), left foot: Secondary | ICD-10-CM | POA: Diagnosis not present

## 2020-09-02 DIAGNOSIS — Z9889 Other specified postprocedural states: Secondary | ICD-10-CM

## 2020-09-02 HISTORY — DX: Other specified postprocedural states: Z98.890

## 2020-09-08 ENCOUNTER — Other Ambulatory Visit: Payer: Self-pay

## 2020-09-08 ENCOUNTER — Encounter: Payer: Self-pay | Admitting: Podiatry

## 2020-09-08 ENCOUNTER — Ambulatory Visit (INDEPENDENT_AMBULATORY_CARE_PROVIDER_SITE_OTHER): Payer: Medicare Other | Admitting: Podiatry

## 2020-09-08 ENCOUNTER — Ambulatory Visit (INDEPENDENT_AMBULATORY_CARE_PROVIDER_SITE_OTHER): Payer: Medicare Other

## 2020-09-08 DIAGNOSIS — M21612 Bunion of left foot: Secondary | ICD-10-CM

## 2020-09-08 DIAGNOSIS — M21619 Bunion of unspecified foot: Secondary | ICD-10-CM

## 2020-09-08 NOTE — Progress Notes (Signed)
Subjective:   Patient ID: Katherine Donovan, female   DOB: 75 y.o.   MRN: [id number]   HPI Patient states doing very well with surgery very pleased with results   ROS      Objective:  Physical Exam  Neuro vascular status intact negative Bevelyn Buckles' sign noted wound edges coapted well first MPJ good alignment good range of motion no crepitus     Assessment:  Doing well post osteotomy first metatarsal left     Plan:  H&P reviewed condition reapplied compression advised on continued elevation surgical shoe usage and boot usage without doing any other changes and reappoint 3 weeks or earlier if needed  X-ray indicates there is slight stress on the osteotomy but is healing well and should heal uneventfully

## 2020-09-29 ENCOUNTER — Ambulatory Visit (INDEPENDENT_AMBULATORY_CARE_PROVIDER_SITE_OTHER): Payer: Medicare Other

## 2020-09-29 ENCOUNTER — Ambulatory Visit (INDEPENDENT_AMBULATORY_CARE_PROVIDER_SITE_OTHER): Payer: Medicare Other | Admitting: Podiatry

## 2020-09-29 ENCOUNTER — Other Ambulatory Visit: Payer: Self-pay

## 2020-09-29 ENCOUNTER — Encounter: Payer: Self-pay | Admitting: Podiatry

## 2020-09-29 DIAGNOSIS — M21619 Bunion of unspecified foot: Secondary | ICD-10-CM | POA: Diagnosis not present

## 2020-09-29 DIAGNOSIS — M21612 Bunion of left foot: Secondary | ICD-10-CM

## 2020-09-29 NOTE — Progress Notes (Signed)
Subjective:   Patient ID: Katherine Donovan, female   DOB: 75 y.o.   MRN: [id number]   HPI Patient presents stating that she is doing very well with surgery and very pleased so far with recovery   ROS      Objective:  Physical Exam  Neurovascular status intact negative Bevelyn Buckles' sign noted wound edges well coapted hallux in rectus position good range of motion noted     Assessment:  Current doing well post osteotomy right     Plan:  H&P x-ray reviewed condition discussed continue elevation compression immobilization reappoint 4 weeks and dispensed ankle compression stocking to wear at this time  X-rays indicate that there is healing of the osteotomy and curling with good alignment noted fixation in place

## 2020-11-03 ENCOUNTER — Ambulatory Visit (INDEPENDENT_AMBULATORY_CARE_PROVIDER_SITE_OTHER): Payer: Medicare Other | Admitting: Podiatry

## 2020-11-03 ENCOUNTER — Ambulatory Visit (INDEPENDENT_AMBULATORY_CARE_PROVIDER_SITE_OTHER): Payer: Medicare Other

## 2020-11-03 ENCOUNTER — Encounter: Payer: Self-pay | Admitting: Podiatry

## 2020-11-03 ENCOUNTER — Other Ambulatory Visit: Payer: Self-pay

## 2020-11-03 DIAGNOSIS — M21612 Bunion of left foot: Secondary | ICD-10-CM | POA: Diagnosis not present

## 2020-11-03 DIAGNOSIS — M21619 Bunion of unspecified foot: Secondary | ICD-10-CM

## 2020-11-04 NOTE — Progress Notes (Signed)
Subjective:   Patient ID: Katherine Donovan, female   DOB: 75 y.o.   MRN: [id number]   HPI Patient states she is doing very well left foot very pleased is just getting back into regular shoes and continues elevation with mild swelling   ROS      Objective:  Physical Exam  Neurovascular status intact negative Bevelyn Buckles' sign noted first MPJ range of motion adequate no crepitus of the joint or restriction     Assessment:  Doing well post osteotomy first metatarsal left     Plan:  H&P reviewed condition and x-ray recommended the continuation of anti-inflammatories physical therapy and patient will be seen back to recheck as needed  X-rays indicate osteotomies healing well there might be a very mild movement dorsally but is healing uneventfully and should not be any form of pathology for

## 2021-07-05 HISTORY — PX: BREAST LUMPECTOMY: SHX2

## 2021-08-20 ENCOUNTER — Other Ambulatory Visit: Payer: Self-pay | Admitting: Family Medicine

## 2021-08-20 DIAGNOSIS — Z1231 Encounter for screening mammogram for malignant neoplasm of breast: Secondary | ICD-10-CM

## 2021-08-21 ENCOUNTER — Ambulatory Visit
Admission: RE | Admit: 2021-08-21 | Discharge: 2021-08-21 | Disposition: A | Discharge status: Home / Self Care | Payer: Medicare Other | Source: Ambulatory Visit | Attending: Family Medicine | Admitting: Family Medicine

## 2021-08-21 DIAGNOSIS — Z1231 Encounter for screening mammogram for malignant neoplasm of breast: Secondary | ICD-10-CM

## 2021-08-24 ENCOUNTER — Other Ambulatory Visit: Payer: Self-pay | Admitting: Family Medicine

## 2021-08-24 DIAGNOSIS — N6489 Other specified disorders of breast: Secondary | ICD-10-CM

## 2021-08-24 DIAGNOSIS — N6320 Unspecified lump in the left breast, unspecified quadrant: Secondary | ICD-10-CM

## 2021-09-01 ENCOUNTER — Ambulatory Visit
Admission: RE | Admit: 2021-09-01 | Discharge: 2021-09-01 | Disposition: A | Discharge status: Home / Self Care | Payer: Medicare Other | Source: Ambulatory Visit | Attending: Family Medicine | Admitting: Family Medicine

## 2021-09-01 ENCOUNTER — Other Ambulatory Visit: Payer: Self-pay | Admitting: Family Medicine

## 2021-09-01 DIAGNOSIS — N6320 Unspecified lump in the left breast, unspecified quadrant: Secondary | ICD-10-CM

## 2021-09-01 DIAGNOSIS — N6489 Other specified disorders of breast: Secondary | ICD-10-CM

## 2021-09-14 ENCOUNTER — Ambulatory Visit
Admission: RE | Admit: 2021-09-14 | Discharge: 2021-09-14 | Disposition: A | Discharge status: Home / Self Care | Payer: Medicare Other | Source: Ambulatory Visit | Attending: Family Medicine | Admitting: Family Medicine

## 2021-09-14 DIAGNOSIS — N6320 Unspecified lump in the left breast, unspecified quadrant: Secondary | ICD-10-CM

## 2021-09-14 DIAGNOSIS — N6489 Other specified disorders of breast: Secondary | ICD-10-CM

## 2021-09-15 ENCOUNTER — Other Ambulatory Visit: Payer: Self-pay | Admitting: Family Medicine

## 2021-09-15 DIAGNOSIS — C50919 Malignant neoplasm of unspecified site of unspecified female breast: Secondary | ICD-10-CM

## 2021-09-16 ENCOUNTER — Telehealth: Payer: Self-pay | Admitting: Hematology

## 2021-09-16 NOTE — Telephone Encounter (Signed)
Spoke to patient to confirm morning clinic appointment for 3/22, packet will be emailed to patient ?

## 2021-09-18 ENCOUNTER — Encounter: Payer: Self-pay | Admitting: *Deleted

## 2021-09-18 DIAGNOSIS — C50412 Malignant neoplasm of upper-outer quadrant of left female breast: Secondary | ICD-10-CM

## 2021-09-21 ENCOUNTER — Ambulatory Visit
Admission: RE | Admit: 2021-09-21 | Discharge: 2021-09-21 | Disposition: A | Discharge status: Home / Self Care | Payer: Medicare Other | Source: Ambulatory Visit | Attending: Family Medicine | Admitting: Family Medicine

## 2021-09-21 ENCOUNTER — Other Ambulatory Visit: Payer: Self-pay

## 2021-09-21 DIAGNOSIS — C50919 Malignant neoplasm of unspecified site of unspecified female breast: Secondary | ICD-10-CM

## 2021-09-21 MED ORDER — GADOBUTROL 1 MMOL/ML IV SOLN
6.0000 mL | Freq: Once | INTRAVENOUS | Status: AC | PRN
  Administered 2021-09-21: 6 mL via INTRAVENOUS

## 2021-09-21 NOTE — Progress Notes (Signed)
?Radiation Oncology         [phone] ?________________________________ ? ?Name: Katherine Donovan        MRN: [id number]  ?Date of Service: 09/23/2021 DOB: 06-13-1946 ? ?[id number], Katherine Conroy, MD  [name], MD    ? ?REFERRING PHYSICIAN: [name], MD ? ? ?DIAGNOSIS: The encounter diagnosis was Malignant neoplasm of upper-outer quadrant of left breast in female, estrogen receptor positive ([place]). ? ? ?HISTORY OF PRESENT ILLNESS: Katherine Donovan is a 76 y.o. female seen in the multidisciplinary breast clinic for a new diagnosis of left breast cancer. The patient was noted to have a screening detected mass in the left breast as well as a right breast asymmetry, further assessment of the right breast was consistent with fibroglandular change.  In her left breast however at 1:00 there was a 2.9 cm mass, there were tendrils of the tumor associated in this area in total measuring up to 3.8 cm.  Her axilla was negative for adenopathy.  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  She is seen today to discuss treatment recommendations of her cancer. ? ? ? ?PREVIOUS RADIATION THERAPY: No ? ? ?PAST MEDICAL HISTORY:  ?Past Medical History:  ?Diagnosis Date  ? Hyperlipidemia   ? Osteoporosis   ?   ? ? ?PAST SURGICAL HISTORY: ?Past Surgical History:  ?Procedure Laterality Date  ? SIGMOIDOSCOPY  1999  ? TUBAL LIGATION    ? ? ? ?FAMILY HISTORY:  ?Family History  ?Problem Relation Age of Onset  ? Cancer Mother   ? Hypertension Father   ? Heart attack Father   ? Breast cancer Sister   ? Obesity Sister   ? Colon cancer Neg Hx   ? ? ? ?SOCIAL HISTORY:  reports that she quit smoking about 54 years ago. Her smoking use included cigarettes. She has a 0.13 pack-year smoking history. She has never used smokeless tobacco. She reports  current alcohol use of about 2.0 standard drinks per week. She reports that she does not use drugs. The patient is married and lives in [place]. 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 She's originally from [place]. She enjoys traveling, cooking, and walking each day.  ? ? ?ALLERGIES: Patient has no known allergies. ? ? ?MEDICATIONS:  ?Current Outpatient Medications  ?Medication Sig Dispense Refill  ? alendronate (FOSAMAX) 70 MG tablet Take 70 mg by mouth once a week. Take with a full glass of water on an empty stomach.    ? Calcium Carbonate-Vitamin D (CALCIUM 600+D) 600-400 MG-UNIT per tablet Take 1 tablet by mouth daily.      ? CRESTOR 10 MG tablet Take 5 mg by mouth Daily.    ? Glucosamine 750 MG TABS Take 750 mg by mouth daily.      ? Lactobacillus Rhamnosus, GG, (RA PROBIOTIC DIGESTIVE CARE) CAPS Take 1 capsule by mouth 3 (three) times a week.    ? Multiple Vitamin (MULTI-VITAMIN) tablet Take 1 tablet by mouth daily.    ? ?No current facility-administered medications for this visit.  ? ? ? ?REVIEW OF SYSTEMS: On review of systems, the patient reports that she is doing well overall. She is interested in starting her therapy as soon as possible. She's had some  soreness of her breast since her biopsy and bruises easily. No other complaints are verbalized. ? ?  ? ?PHYSICAL EXAM:  ?Wt Readings from Last 3 Encounters:  ?04/29/20 124 lb (56.2 kg)  ?04/01/20 124 lb (56.2 kg)  ?04/07/16 129 lb (58.5 kg)  ? ?Temp Readings from Last 3 Encounters:  ?04/07/16 97.1 ?F (36.2 ?C) (Temporal)  ?02/01/11 98.1 ?F (36.7 ?C)  ? ?BP Readings from Last 3 Encounters:  ?04/29/20 (!) 161/91  ?04/01/20 (!) 150/98  ?04/07/16 122/68  ? ?Pulse Readings from Last 3 Encounters:  ?04/29/20 72  ?04/01/20 73  ?04/07/16 69  ? ? ?In general this is a well appearing caucasian female in no acute distress. She's alert and oriented x4 and  appropriate throughout the examination. Cardiopulmonary assessment is negative for acute distress and she exhibits normal effort. Bilateral breast exam is deferred. ? ? ? ?ECOG = 1 ? ?0 - Asymptomatic (Fully active, able to carry on all predisease activities without restriction) ? ?1 - Symptomatic but completely ambulatory (Restricted in physically strenuous activity but ambulatory and able to carry out work of a light or sedentary nature. For example, light housework, office work) ? ?2 - Symptomatic, <50% in bed during the day (Ambulatory and capable of all self care but unable to carry out any work activities. Up and about more than 50% of waking hours) ? ?3 - Symptomatic, >50% in bed, but not bedbound (Capable of only limited self-care, confined to bed or chair 50% or more of waking hours) ? ?4 - Bedbound (Completely disabled. Cannot carry on any self-care. Totally confined to bed or chair) ? ?5 - Death ? ? Oken MM, Creech RH, Tormey DC, et al. [phone]). "Toxicity and response criteria of the [facility] Group". [place] Oncol. 5 (6): 649-55 ? ? ? ?LABORATORY DATA:  ?No results found for: WBC, HGB, HCT, MCV, PLT ?No results found for: NA, K, CL, CO2 ?No results found for: ALT, AST, GGT, ALKPHOS, BILITOT ?  ? ?RADIOGRAPHY: MR BREAST BILATERAL W WO CONTRAST INC CAD ? ?Result Date: 09/21/2021 ?CLINICAL DATA:  76 year old female with newly diagnosed LEFT breast cancer. EXAM: BILATERAL BREAST MRI WITH AND WITHOUT CONTRAST TECHNIQUE: Multiplanar, multisequence MR images of both breasts were obtained prior to and following the intravenous administration of 6 ml of Gadavist Three-dimensional MR images were rendered by post-processing of the original MR data on an independent workstation. The three-dimensional MR images were interpreted, and findings are reported in the following complete MRI report for this study. Three dimensional images were evaluated at the independent interpreting workstation using  the DynaCAD thin client. COMPARISON:  Prior mammograms and ultrasounds FINDINGS: Breast composition: c. Heterogeneous fibroglandular tissue. Background parenchymal enhancement: Mild Right breast: No mass or abnormal enhancement. Left breast: A 2.5 x 3 x 2.7 cm (AP X transverse X CC) irregular enhancing mass within the UPPER-OUTER LEFT breast, middle depth, is compatible with biopsy-proven malignancy. Biopsy clip artifact is difficult to visualize on this study. No other abnormal areas of enhancement within the LEFT breast noted. Lymph nodes: No abnormal appearing lymph nodes. Ancillary findings:  Hepatic cysts are present. IMPRESSION: 1. 3 cm biopsy-proven malignancy within the UPPER-OUTER LEFT breast. No evidence of multifocal, multicentric or contralateral malignancy. No abnormal lymph nodes. RECOMMENDATION: Treatment plan BI-RADS CATEGORY  6: Known biopsy-proven malignancy. Electronically Signed   By: Margarette Canada M.D.   On: 09/21/2021 12:10  ? ?US BREAST LTD UNI LEFT INC AXILLA ? ?Result Date: 09/01/2021 ?CLINICAL DATA:  Bilateral screening recall for a right breast asymmetry and left breast mass. EXAM: DIGITAL DIAGNOSTIC BILATERAL MAMMOGRAM WITH TOMOSYNTHESIS AND CAD; ULTRASOUND LEFT BREAST LIMITED; ULTRASOUND RIGHT BREAST LIMITED TECHNIQUE: Bilateral digital diagnostic mammography and breast tomosynthesis was performed. The images were evaluated with computer-aided detection.; Targeted ultrasound examination of the left breast was performed.; Targeted ultrasound examination of the right breast was performed COMPARISON:  Previous exam(s). ACR Breast Density Category c: The breast tissue is heterogeneously dense, which may obscure small masses. FINDINGS: Spot compression tomosynthesis images through the medial retroareolar right breast demonstrates a nodular tissue pattern without a discrete mass. Spot compression tomosynthesis images through the upper outer left breast demonstrates a mass with distortion spanning  approximately 2.7 cm, though the margins are not clearly defined and obscured within surrounding dense breast tissue. Physical exam of the upper-outer left breast demonstrates a palpable lump at approxima

## 2021-09-23 ENCOUNTER — Inpatient Hospital Stay: Payer: Medicare Other | Admitting: Licensed Clinical Social Worker

## 2021-09-23 ENCOUNTER — Inpatient Hospital Stay (HOSPITAL_BASED_OUTPATIENT_CLINIC_OR_DEPARTMENT_OTHER): Payer: Medicare Other | Admitting: Hematology

## 2021-09-23 ENCOUNTER — Encounter: Payer: Self-pay | Admitting: *Deleted

## 2021-09-23 ENCOUNTER — Inpatient Hospital Stay (HOSPITAL_BASED_OUTPATIENT_CLINIC_OR_DEPARTMENT_OTHER): Payer: Medicare Other | Admitting: Genetic Counselor

## 2021-09-23 ENCOUNTER — Other Ambulatory Visit: Payer: Self-pay | Admitting: General Surgery

## 2021-09-23 ENCOUNTER — Encounter: Payer: Self-pay | Admitting: Hematology

## 2021-09-23 ENCOUNTER — Ambulatory Visit
Admission: RE | Admit: 2021-09-23 | Discharge: 2021-09-23 | Disposition: A | Discharge status: Home / Self Care | Payer: Medicare Other | Source: Ambulatory Visit | Attending: Radiation Oncology | Admitting: Radiation Oncology

## 2021-09-23 ENCOUNTER — Other Ambulatory Visit: Payer: Self-pay

## 2021-09-23 ENCOUNTER — Inpatient Hospital Stay: Payer: Medicare Other | Attending: Hematology

## 2021-09-23 ENCOUNTER — Encounter: Payer: Self-pay | Admitting: Physical Therapy

## 2021-09-23 ENCOUNTER — Ambulatory Visit: Payer: Medicare Other | Attending: General Surgery | Admitting: Physical Therapy

## 2021-09-23 VITALS — BP 181/89 | HR 90 | Temp 97.3°F | Resp 18 | Ht 64.0 in | Wt 126.7 lb

## 2021-09-23 DIAGNOSIS — Z808 Family history of malignant neoplasm of other organs or systems: Secondary | ICD-10-CM | POA: Insufficient documentation

## 2021-09-23 DIAGNOSIS — M810 Age-related osteoporosis without current pathological fracture: Secondary | ICD-10-CM | POA: Insufficient documentation

## 2021-09-23 DIAGNOSIS — Z803 Family history of malignant neoplasm of breast: Secondary | ICD-10-CM

## 2021-09-23 DIAGNOSIS — Z7289 Other problems related to lifestyle: Secondary | ICD-10-CM | POA: Insufficient documentation

## 2021-09-23 DIAGNOSIS — N6489 Other specified disorders of breast: Secondary | ICD-10-CM | POA: Insufficient documentation

## 2021-09-23 DIAGNOSIS — Z170 Estrogen receptor positive status [ER+]: Secondary | ICD-10-CM | POA: Insufficient documentation

## 2021-09-23 DIAGNOSIS — C50412 Malignant neoplasm of upper-outer quadrant of left female breast: Secondary | ICD-10-CM

## 2021-09-23 DIAGNOSIS — Z8349 Family history of other endocrine, nutritional and metabolic diseases: Secondary | ICD-10-CM | POA: Insufficient documentation

## 2021-09-23 DIAGNOSIS — E785 Hyperlipidemia, unspecified: Secondary | ICD-10-CM

## 2021-09-23 DIAGNOSIS — R293 Abnormal posture: Secondary | ICD-10-CM | POA: Diagnosis present

## 2021-09-23 DIAGNOSIS — Z8249 Family history of ischemic heart disease and other diseases of the circulatory system: Secondary | ICD-10-CM | POA: Insufficient documentation

## 2021-09-23 DIAGNOSIS — Z79899 Other long term (current) drug therapy: Secondary | ICD-10-CM | POA: Insufficient documentation

## 2021-09-23 LAB — CMP (CANCER CENTER ONLY)
ALT: 22 U/L (ref 0–44)
AST: 32 U/L (ref 15–41)
Albumin: 4.4 g/dL (ref 3.5–5.0)
Alkaline Phosphatase: 49 U/L (ref 38–126)
Anion gap: 8 (ref 5–15)
BUN: 13 mg/dL (ref 8–23)
CO2: 26 mmol/L (ref 22–32)
Calcium: 10.2 mg/dL (ref 8.9–10.3)
Chloride: 105 mmol/L (ref 98–111)
Creatinine: 0.74 mg/dL (ref 0.44–1.00)
GFR, Estimated: >60 mL/min (ref >60)
Glucose, Bld: 133 mg/dL — ABNORMAL HIGH (ref 70–99)
Potassium: 3.9 mmol/L (ref 3.5–5.1)
Sodium: 139 mmol/L (ref 135–145)
Total Bilirubin: 0.5 mg/dL (ref 0.3–1.2)
Total Protein: 6.7 g/dL (ref 6.5–8.1)

## 2021-09-23 LAB — CBC WITH DIFFERENTIAL (CANCER CENTER ONLY)
Abs Immature Granulocytes: 0.01 10*3/uL (ref 0.00–0.07)
Basophils Absolute: 0.1 10*3/uL (ref 0.0–0.1)
Basophils Relative: 1 %
Eosinophils Absolute: 0.1 10*3/uL (ref 0.0–0.5)
Eosinophils Relative: 2 %
HCT: 45 % (ref 36.0–46.0)
Hemoglobin: 14.7 g/dL (ref 12.0–15.0)
Immature Granulocytes: 0 %
Lymphocytes Relative: 20 %
Lymphs Abs: 1.5 10*3/uL (ref 0.7–4.0)
MCH: 29.9 pg (ref 26.0–34.0)
MCHC: 32.7 g/dL (ref 30.0–36.0)
MCV: 91.6 fL (ref 80.0–100.0)
Monocytes Absolute: 0.6 10*3/uL (ref 0.1–1.0)
Monocytes Relative: 8 %
Neutro Abs: 5.3 10*3/uL (ref 1.7–7.7)
Neutrophils Relative %: 69 %
Platelet Count: 273 10*3/uL (ref 150–400)
RBC: 4.91 MIL/uL (ref 3.87–5.11)
RDW: 12.1 % (ref 11.5–15.5)
WBC Count: 7.6 10*3/uL (ref 4.0–10.5)
nRBC: 0 % (ref 0.0–0.2)

## 2021-09-23 LAB — GENETIC SCREENING ORDER

## 2021-09-23 MED ORDER — LIDOCAINE-PRILOCAINE 2.5-2.5 % EX CREA: TOPICAL_CREAM | CUTANEOUS | 3 refills | Status: DC

## 2021-09-23 MED ORDER — ONDANSETRON HCL 8 MG PO TABS: 8.0000 mg | ORAL_TABLET | Freq: Two times a day (BID) | ORAL | 1 refills | Status: DC | PRN

## 2021-09-23 MED ORDER — PROCHLORPERAZINE MALEATE 10 MG PO TABS: 10.0000 mg | ORAL_TABLET | Freq: Four times a day (QID) | ORAL | 1 refills | Status: DC | PRN

## 2021-09-23 MED ORDER — DEXAMETHASONE 4 MG PO TABS: 4.0000 mg | ORAL_TABLET | Freq: Every day | ORAL | 0 refills | Status: DC

## 2021-09-23 NOTE — Progress Notes (Signed)
START ON PATHWAY REGIMEN - Breast     Cycle 1: A cycle is 21 days:     Pertuzumab      Trastuzumab-xxxx      Docetaxel      Carboplatin    Cycles 2 through 6: A cycle is every 21 days:     Pertuzumab      Trastuzumab-xxxx      Docetaxel      Carboplatin   **Always confirm dose/schedule in your pharmacy ordering system**  Patient Characteristics: Preoperative or Nonsurgical Candidate (Clinical Staging), Neoadjuvant Therapy followed by Surgery, Invasive Disease, Chemotherapy, HER2 Positive, ER Positive Therapeutic Status: Preoperative or Nonsurgical Candidate (Clinical Staging) AJCC M Category: cM0 AJCC Grade: G2 Breast Surgical Plan: Neoadjuvant Therapy followed by Surgery ER Status: Positive (+) AJCC 8 Stage Grouping: IIA HER2 Status: Positive (+) AJCC T Category: cT2 AJCC N Category: cN0 PR Status: Negative (-) Intent of Therapy: Curative Intent, Discussed with Patient 

## 2021-09-23 NOTE — Progress Notes (Signed)
[place] Clinical Social Work  ?Initial Assessment ? ? ?Katherine Donovan is a 76 y.o. year old female presenting alone. Clinical Social Work was referred by  [facility]  for assessment of psychosocial needs.  ? ?SDOH (Social Determinants of Health) assessments performed: Yes ?SDOH Interventions   ? ?Flowsheet Row Most Recent Value  ?SDOH Interventions   ?Food Insecurity Interventions Intervention Not Indicated  ?Financial Strain Interventions Intervention Not Indicated  ?Housing Interventions Intervention Not Indicated  ?Transportation Interventions Intervention Not Indicated  ? ?  ?  ?Distress Screen completed: Yes ? ?  09/23/2021  ? 11:25 AM  ?ONCBCN DISTRESS SCREENING  ?Screening Type Initial Screening  ?Distress experienced in past week (1-10) 1  ?Information Concerns Type Lack of info about treatment  ?Physical Problem type Pain  ? ? ? ? ?Family/Social Information:  ?Housing Arrangement: patient lives with husband ?Family members/support persons in your life? Family (husband, family in [place]) and Friends and neighbors ?Transportation concerns: no  ?Employment: Retired. Income source: [employer] ?Financial concerns: No ?Type of concern: None ?Food access concerns: no ?Religious or spiritual practice: yes, faith is important to pt ?Services Currently in place:  n/a ? ?Coping/ Adjustment to diagnosis: ?Patient understands treatment plan and what happens next? yes, feels very calm and ready to deal with this and move forward ?Concerns about diagnosis and/or treatment: I'm not especially worried about anything ?Patient reported stressors:  none ?Hopes and priorities: be treated and be able to return to activities ?Patient enjoys  time with husband, crochet, music, cooking, travel ?Current coping skills/ strengths: Average or above average intelligence , Capable of independent living , Communication skills , Financial means , Motivation for treatment/growth , Special hobby/interest , and Supportive  family/friends  ? ? ? SUMMARY: ?Current SDOH Barriers:  ?No SDOH barriers identified today ? ?Clinical Social Work Clinical Goal(s):  ?None at this time ? ?Interventions: ?Discussed common feeling and emotions when being diagnosed with cancer, and the importance of support during treatment ?Informed patient of the support team roles and support services at [facility] ?Provided CSW contact information and encouraged patient to call with any questions or concerns ? ? ?Follow Up Plan: Patient will contact CSW with any support or resource needs ?Patient verbalizes understanding of plan: Yes ? ? ? ?Steffon Gladu E Carmelina Balducci, LCSW ?

## 2021-09-23 NOTE — Research (Signed)
[employer] 2021-05 - Specimen Collection Study to Evaluate Biomarkers in Subjects with Cancer  ? ?Patient Katherine Donovan was identified by Dr Burr Medico as a potential candidate for the above listed study.  This Clinical Research Nurse met with NORAA PICKERAL, [id number], on 09/23/21 in a manner and location that ensures patient privacy to discuss participation in the above listed research study.  Patient is Unaccompanied.  A copy of the informed consent document with embedded HIPAA language was provided to the patient.  Patient reads, speaks, and understands [place].   ? ?Patient was provided with the business card of this Nurse and encouraged to contact the research team with any questions.  Approximately 15 minutes were spent with the patient reviewing the informed consent documents.  Patient was provided the option of taking informed consent documents home to review and was encouraged to review at their convenience with their support network, including other care providers. Patient took the consent documents home to review. ? ?Patient stated that she will reach out to research with any questions or concerns and is open to a follow-up call early next week. ? ?Vickii Penna, RN, BSN, CPN ?Clinical Research Nurse I ?[phone] ? ?09/23/2021 11:22 AM ? ? ?

## 2021-09-23 NOTE — Research (Signed)
[id number] - TREATMENT OF REFRACTORY NAUSEA ? ?Patient Katherine Donovan was identified by Dr Burr Medico as a potential candidate for the above listed study.  This Clinical Research Nurse met with Katherine Donovan, [id number], on 09/23/21 in a manner and location that ensures patient privacy to discuss participation in the above listed research study.  Patient is Unaccompanied.  A copy of the informed consent document and separate HIPAA Authorization was provided to the patient.  Patient reads, speaks, and understands [place].   ? ?Patient was provided with the business card of this Nurse and encouraged to contact the research team with any questions.  Approximately 15 minutes were spent with the patient reviewing the informed consent documents.  Patient was provided the option of taking informed consent documents home to review and was encouraged to review at their convenience with their support network, including other care providers. Patient took the consent documents home to review. ? ?Patient stated that she will reach out to research with any questions or concerns and is open to a follow-up call early next week. ? ?Vickii Penna, RN, BSN, CPN ?Clinical Research Nurse I ?[phone] ? ?09/23/2021 11:22 AM ? ? ?

## 2021-09-23 NOTE — Therapy (Signed)
?OUTPATIENT PHYSICAL THERAPY BREAST CANCER BASELINE EVALUATION ? ? ?Patient Name: Katherine Donovan ?MRN: [id number] ?DOB:1946-05-12, 76 y.o., female ?Today's Date: 09/23/2021 ? ? PT End of Session - 09/23/21 1036   ? ? Visit Number 1   ? Number of Visits 2   ? Date for PT Re-Evaluation 03/26/22   ? PT Start Time [phone]   ? PT Stop Time 1019   ? PT Time Calculation (min) 30 min   ? Activity Tolerance Patient tolerated treatment well   ? Behavior During Therapy [facility] for tasks assessed/performed   ? ?  ?  ? ?  ? ? ?Past Medical History:  ?Diagnosis Date  ? Breast cancer ([place]) 09/15/21  ? Hyperlipidemia   ? Osteoporosis   ? ?Past Surgical History:  ?Procedure Laterality Date  ? SIGMOIDOSCOPY  1999  ? TUBAL LIGATION    ? ?Patient Active Problem List  ? Diagnosis Date Noted  ? Malignant neoplasm of upper-outer quadrant of left breast in female, estrogen receptor positive ([place]) 09/18/2021  ? ? ?PCP: Nickola Major, MD ? ?REFERRING PROVIDER: [name], MD ? ?REFERRING DIAG: Left breast cancer ? ?THERAPY DIAG:  ?Malignant neoplasm of upper-outer quadrant of left breast in female, estrogen receptor positive ([place]) ? ?Abnormal posture ? ?ONSET DATE: 08/21/2021 ? ?SUBJECTIVE                                                                                                                                                                                          ? ?SUBJECTIVE STATEMENT: ?Patient reports she is here today to be seen by her medical team for her newly diagnosed left breast cancer.  ? ?PERTINENT HISTORY:  ?Patient was diagnosed on 08/21/2021 with 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 It measures 3 cm and is located in the upper outer quadrant. It is ER positive, PR negative and HER2 positive with a Ki67 of 15%.  ? ?PATIENT GOALS   reduce lymphedema risk and learn post op HEP.  ? ?PAIN:  ?Are you having pain? No ? ? ?PRECAUTIONS: Active CA None ? ?HAND DOMINANCE: right ? ?WEIGHT BEARING  RESTRICTIONS No ? ?FALLS:  ?Has patient fallen in last 6 months? No, Number of falls: 0 ? ?LIVING ENVIRONMENT: ?Patient lives with: husband ?Lives in: House/apartment ?Has following equipment at home: None ? ?OCCUPATION: retired ? ?LEISURE: She does the elliptical 3-5x/week for 15-20 minutes ? ?PRIOR LEVEL OF FUNCTION: Independent ? ? ?OBJECTIVE ? ?COGNITION: ? Overall cognitive status: Within functional limits for tasks assessed   ? ?POSTURE:  ?Forward head and rounded shoulders posture ? ?UPPER EXTREMITY AROM/PROM: ? ?A/PROM RIGHT  09/23/2021 ?  ?  Shoulder extension 63  ?Shoulder flexion 162  ?Shoulder abduction 170  ?Shoulder internal rotation 83  ?Shoulder external rotation 82  ?  (Blank rows = not tested) ? ?A/PROM LEFT  09/23/2021  ?Shoulder extension 69  ?Shoulder flexion 152  ?Shoulder abduction 158  ?Shoulder internal rotation 69  ?Shoulder external rotation 89  ?  (Blank rows = not tested) ? ? ?CERVICAL AROM: ?All within normal limits ? ? ?UPPER EXTREMITY STRENGTH: WNL ? ? ?LYMPHEDEMA ASSESSMENTS:  ? ?LANDMARK RIGHT  09/23/2021  ?10 cm proximal to olecranon process 23.5  ?Olecranon process 19.7  ?10 cm proximal to ulnar styloid process 16.1  ?Just proximal to ulnar styloid process 13.1  ?Across hand at thumb web space 17  ?At base of 2nd digit 5.3  ?(Blank rows = not tested) ? ?[place] LEFT  09/23/2021  ?10 cm proximal to olecranon process 23.4  ?Olecranon process 20  ?10 cm proximal to ulnar styloid process 15.7  ?Just proximal to ulnar styloid process 13  ?Across hand at thumb web space 16.5  ?At base of 2nd digit 5.3  ?(Blank rows = not tested) ? ? ?L-DEX LYMPHEDEMA SCREENING: ? ?The patient was assessed using the L-Dex machine today to produce a lymphedema index baseline score. The patient will be reassessed on a regular basis (typically every 3 months) to obtain new L-Dex scores. If the score is > 6.5 points away from his/her baseline score indicating onset of subclinical lymphedema, it will be  recommended to wear a compression garment for 4 weeks, 12 hours per day and then be reassessed. If the score continues to be > 6.5 points from baseline at reassessment, we will initiate lymphedema treatment. Assessing in this manner has a 95% rate of preventing clinically significant lymphedema. ? ? L-DEX FLOWSHEETS - 09/23/21 1100   ? ?  ? L-DEX LYMPHEDEMA SCREENING  ? Measurement Type Unilateral   ? L-DEX MEASUREMENT EXTREMITY Upper Extremity   ? POSITION  Standing   ? DOMINANT SIDE Right   ? At Risk Side Left   ? BASELINE SCORE (UNILATERAL) 2.6   ? ?  ?  ? ?  ? ? ? ?QUICK DASH SURVEY: ? Katina Dung - 09/23/21 0001   ? ? Open a tight or new jar Mild difficulty   ? Do heavy household chores (wash walls, wash floors) No difficulty   ? Carry a shopping bag or briefcase No difficulty   ? Wash your back No difficulty   ? Use a knife to cut food No difficulty   ? Recreational activities in which you take some force or impact through your arm, shoulder, or hand (golf, hammering, tennis) No difficulty   ? During the past week, to what extent has your arm, shoulder or hand problem interfered with your normal social activities with family, friends, neighbors, or groups? Not at all   ? During the past week, to what extent has your arm, shoulder or hand problem limited your work or other regular daily activities Not at all   ? Arm, shoulder, or hand pain. None   ? Tingling (pins and needles) in your arm, shoulder, or hand None   ? Difficulty Sleeping No difficulty   ? DASH Score 2.27 %   ? ?  ?  ? ?  ? ? ? ?PATIENT EDUCATION:  ?Education details: Lymphedema risk reduction and post op shoulder/posture HEP ?Person educated: Patient ?Education method: Explanation, Demonstration, Handout ?Education comprehension: Patient verbalized understanding and returned demonstration ? ? ?HOME  EXERCISE PROGRAM: ?Patient was instructed today in a home exercise program today for post op shoulder range of motion. These included active assist  shoulder flexion in sitting, scapular retraction, wall walking with shoulder abduction, and hands behind head external rotation.  She was encouraged to do these twice a day, holding 3 seconds and repeating 5 times when permitted by her physician. ? ? ?ASSESSMENT: ? ?CLINICAL IMPRESSION: ?Patient was diagnosed on 08/21/2021 with 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 It measures 3 cm and is located in the upper outer quadrant. It is ER positive, PR negative and HER2 positive with a Ki67 of 15%. Her multidisciplinary medical team met prior to her assessments to determine a recommended treatment plan. She is planning to have neoadjuvant chemotherapy followed by a left lumpectomy or mastectomy and sentinel node biopsy, radiation, and anti-estrogen therapy. She will benefit from a post op PT reassessment to determine needs and from L-Dex screens every 3 months for 2 years to detect subclinical lymphedema. ? ?Pt will benefit from skilled therapeutic intervention to improve on the following deficits: Decreased knowledge of precautions, impaired UE functional use, pain, decreased ROM, postural dysfunction.  ? ?PT treatment/interventions: ADL/self-care home management, pt/family education, therapeutic exercise ? ?REHAB POTENTIAL: Excellent ? ?CLINICAL DECISION MAKING: Stable/uncomplicated ? ?EVALUATION COMPLEXITY: Low ? ? ?GOALS: ?Goals reviewed with patient? YES ? ?LONG TERM GOALS: (STG=LTG) ? ? Name Target Date Goal status  ?1 Pt will be able to verbalize understanding of pertinent lymphedema risk reduction practices relevant to her dx specifically related to skin care.  ?Baseline:  No knowledge 09/23/2021 Achieved at eval  ?2 Pt will be able to return demo and/or verbalize understanding of the post op HEP related to regaining shoulder ROM. ?Baseline:  No knowledge 09/23/2021 Achieved at eval  ?3 Pt will be able to verbalize understanding of the importance of attending the post op After Breast CA Class  for further lymphedema risk reduction education and therapeutic exercise.  ?Baseline:  No knowledge 09/23/2021 Achieved at eval  ?4 Pt will demo she has regained full shoulder ROM and function post operatively compared to bas

## 2021-09-23 NOTE — Progress Notes (Addendum)
?[facility]   ?[phone] [phone]   ?Clinic New Consult Note  ? ?Patient Care Team: ?Nickola Major, MD as PCP - General (Family Medicine) ?Mauro Kaufmann, RN as Oncology Nurse Navigator ?Rockwell Germany, RN as Oncology Nurse Navigator ?[name], MD as Consulting Physician (General Surgery) ?[name], MD as Consulting Physician (Hematology) ?Kyung Rudd, MD as Consulting Physician (Radiation Oncology) ? ?Date of Service:  09/23/2021  ? ?CHIEF COMPLAINTS/PURPOSE OF CONSULTATION:  ?Left Breast Cancer, ER+/Her2+ ? ?REFERRING PHYSICIAN:  ?[facility] ? ? ?ASSESSMENT & PLAN:  ?Katherine Donovan is a 76 y.o. postmenopausal female with ? ?1. Malignant neoplasm of upper-outer quadrant of left breast, ILC, Stage IA, c(T1c, N0), ER+/PR-/HER2+, Grade 3  ?-found on screening mammogram. B/l MM and [place] on 09/01/21 showed a 2.9 cm left breast mass. Biopsy on 09/14/21 showed invasive lobular carcinoma, grade 2-3. ?-breast MRI on 09/21/21 showed 3 cm UOQ left breast malignancy, negative for additional malignancy or adenopathy. ?--We discussed her imaging findings and the biopsy results in great details. ?-Given her Her2 positive T2 disease, her risk of recurrence is high and she would benefit from chemotherapy. Our recommendation is neoadjuvant chemotherapy to shrink her tumor, followed by lumpectomy if she has good response. T ?-I recommend TCHP regimen (docetaxel, carboplatin, trastuzumab and Perjeta) every 3 weeks for 6 cycles then continue herceptin/perjeta or Kadcyla after surgery to complete a year therapy. We discussed that we will monitor the tumor size either by physical exam during her chemotherapy. ?-Given her stage I cancer, and no clinical suspicion for metastasis, she does not need staging scans. ?-Giving the strong ER and PR expression in her postmenopausal status, I recommend adjuvant endocrine therapy with aromatase inhibitor for a total of 5-10 years to  reduce the risk of cancer recurrence. Given her osteoporosis, I would recommend tamoxifen. Potential benefits and side effects were briefly discussed with patient, and she is interested. ?-She was also seen by radiation oncologist Dr. Lisbeth Renshaw today. She will likely benefit from breast radiation if she undergo lumpectomy to decrease the risk of breast cancer.  ?-We also discussed the breast cancer surveillance after her surgery. She will continue annual screening mammogram, self exam, and a routine office visit with lab and exam with [place]. ?-I encouraged her to have healthy diet and exercise regularly.  ? ?2. Osteoporosis ?-Her most recent DEXA was 05/14/20 showing T-score of -3.6.  ?-she reports being on Fosamax consistently for the past 10 years. ? ? ?PLAN:  ?-chemo education class ?-echo  ?-port placement ?-lab, flush, f/u and chemo TCHP in about 2 weeks, I called in Emla cream, Zofran, Compazine and dexamethasone. ? ? ?Oncology History Overview Note  ? Cancer Staging  ?Malignant neoplasm of upper-outer quadrant of left breast in female, estrogen receptor positive ([place]) ?Staging form: Breast, AJCC 8th Edition ?- Clinical stage from 09/14/2021: Stage IA (cT1c, cN0, cM0, G3, ER+, PR-, HER2+) - Signed by [name], MD on 09/22/2021 ? ?  ?Malignant neoplasm of upper-outer quadrant of left breast in female, estrogen receptor positive ([place])  ?09/01/2021 Mammogram  ? CLINICAL DATA:  Bilateral screening recall for a right breast ?asymmetry and left breast mass. ?  ?EXAM: ?DIGITAL DIAGNOSTIC BILATERAL MAMMOGRAM WITH TOMOSYNTHESIS AND CAD; ?ULTRASOUND LEFT BREAST LIMITED; ULTRASOUND RIGHT BREAST LIMITED ? ?IMPRESSION: ?1. There is a highly suspicious ill-defined mass in the upper-outer ?left breast. The mass measures at least 2.9 cm, and possibly up to ?3.8 cm. Accurate measurement is difficult due  to its ill-defined ?insinuating appearance. ?  ?2.  No evidence of left axillary lymphadenopathy. ?  ?3. No persistent suspicious  mammographic or targeted sonographic abnormalities in the right breast. ?  ?09/14/2021 Cancer Staging  ? Staging form: Breast, AJCC 8th Edition ?- Clinical stage from 09/14/2021: Stage IA (cT1c, cN0, cM0, G3, ER+, PR-, HER2+) - Signed by [name], MD on 09/22/2021 ?Stage prefix: Initial diagnosis ?Histologic grading system: 3 grade system ? ?  ?09/14/2021 Initial Biopsy  ? Diagnosis ?Breast, left, needle core biopsy, 1 o'clock, 4cmfn ?- INVASIVE MAMMARY CARCINOMA. SEE NOTE ?Diagnosis Note ?Carcinoma measures 1.3 cm in greatest linear dimension and appears grade 2-3. ? ?Immunohistochemical stain for E-cadherin is negative, consistent with a lobular phenotype. ? ?PROGNOSTIC INDICATORS ?Results: ?The tumor cells are POSITIVE for Her2 (3+). ?Estrogen Receptor: 50%, POSITIVE, MODERATE-WEAK STAINING INTENSITY ?Progesterone Receptor: 0%, NEGATIVE ?Proliferation Marker Ki67: 15% ?  ?09/18/2021 Initial Diagnosis  ? Malignant neoplasm of upper-outer quadrant of left breast in female, estrogen receptor positive ([place]) ?  ?09/21/2021 Imaging  ? EXAM: ?BILATERAL BREAST MRI WITH AND WITHOUT CONTRAST ? ?IMPRESSION: ?1. 3 cm biopsy-proven malignancy within the UPPER-OUTER LEFT breast. ?No evidence of multifocal, multicentric or contralateral malignancy. ?No abnormal lymph nodes. ?  ? ? ? ?HISTORY OF PRESENTING ILLNESS:  ?Katherine Donovan 76 y.o. female is a here because of breast cancer. The patient was [facility] by [facility]. The patient presents to the clinic today alone. ? ? ?She had routine screening mammography on 08/21/21 showing a possible abnormality in the bilateral breasts. She underwent bilateral diagnostic mammography and bilateral breast ultrasonography on 09/01/21 showing: upper-outer left breast mass measuring between 2.9-3.8 cm; no evidence of left axillary lymphadenopathy; no persistent suspicious abnormalities in right breast. ? ?Biopsy on 09/14/21 showed: invasive mammary carcinoma, e-cadherin negative, grade 2-3.  Prognostic indicators significant for: estrogen receptor, 50% positive and progesterone receptor, 0% negative. Proliferation marker Ki67 at 15%. HER2 positive. ? ? ?Today the patient notes they felt/feeling prior/after... ?-she reports dense breasts, noting her breasts always feel "lumpy" to her. ? ?She has a PMHx of.... ?-osteoporosis, on Fosamax for ~10 years ?-s/p tubal ligation ?-hyperlipidemia ? ?Socially... ?-she is married, no children. ?-she reports melanoma in her mother, breast cancer in two sisters (one in her 51's) ?-she notes she briefly tried smoking because her husband smoked (so she at least has second hand exposure) ?-she enjoys traveling and walking. She stays active with elliptical at home. ? ?GYN HISTORY  ?Menarchal: xx ?LMP: xx ?Contraceptive: ?HRT:  ?GP: 0 ? ? ?REVIEW OF SYSTEMS:    ?Constitutional: Denies fevers, chills or abnormal night sweats ?Eyes: Denies blurriness of vision, double vision or watery eyes ?Ears, nose, mouth, throat, and face: Denies mucositis or sore throat ?Respiratory: Denies cough, dyspnea or wheezes ?Cardiovascular: Denies palpitation, chest discomfort or lower extremity swelling ?Gastrointestinal:  Denies nausea, heartburn or change in bowel habits ?Skin: Denies abnormal skin rashes ?Lymphatics: Denies new lymphadenopathy or easy bruising ?Neurological:Denies numbness, tingling or new weaknesses ?Behavioral/Psych: Mood is stable, no new changes  ?All other systems were reviewed with the patient and are negative. ? ? ?MEDICAL HISTORY:  ?Past Medical History:  ?Diagnosis Date  ? Breast cancer ([facility]) 09/15/21  ? Hyperlipidemia   ? Osteoporosis   ? ? ?SURGICAL HISTORY: ?Past Surgical History:  ?Procedure Laterality Date  ? SIGMOIDOSCOPY  1999  ? TUBAL LIGATION    ? ? ?SOCIAL HISTORY: ?Social History  ? ?Socioeconomic History  ? Marital status: Married  ?  Spouse name: Not on file  ? Number of children: 0  ? Years of education: Not on file  ? Highest education level: Not on  file  ?Occupational History  ? Not on file  ?Tobacco Use  ? Smoking status: Former  ?  Types: Cigarettes  ? Smokeless tobacco: Never  ? Tobacco comments:  ?  Never really liked or began a habit.  ?Vaping Use

## 2021-09-24 ENCOUNTER — Encounter: Payer: Self-pay | Admitting: Genetic Counselor

## 2021-09-24 ENCOUNTER — Encounter: Payer: Self-pay | Admitting: Hematology

## 2021-09-24 NOTE — Progress Notes (Signed)
REFERRING PROVIDER: ?[name], MD ?[address] ?[place],  [place] [address] ? ?PRIMARY PROVIDER:  ?Nickola Major, MD ? ?PRIMARY REASON FOR VISIT:  ?1. Malignant neoplasm of upper-outer quadrant of left breast in female, estrogen receptor positive ([place])   ? ?HISTORY OF PRESENT ILLNESS:   ?Katherine Donovan, a 76 y.o. female, was seen for a [place] cancer genetics consultation at the request of Dr. Burr Medico due to a personal history of cancer.  Katherine Donovan presents to clinic today to discuss the possibility of a hereditary predisposition to cancer, to discuss genetic testing, and to further clarify her future cancer risks, as well as potential cancer risks for family members.  ? ?In 2023, at the age of 66, Katherine Donovan was diagnosed with invasive lobular carcinoma of the left breast. The treatment plan is pending.  ? ?CANCER HISTORY:  ?Oncology History Overview Note  ? Cancer Staging  ?Malignant neoplasm of upper-outer quadrant of left breast in female, estrogen receptor positive ([place]) ?Staging form: Breast, AJCC 8th Edition ?- Clinical stage from 09/14/2021: Stage IA (cT1c, cN0, cM0, G3, ER+, PR-, HER2+) - Signed by [name], MD on 09/22/2021 ? ?  ?Malignant neoplasm of upper-outer quadrant of left breast in female, estrogen receptor positive ([place])  ?09/01/2021 Mammogram  ? CLINICAL DATA:  Bilateral screening recall for a right breast ?asymmetry and left breast mass. ?  ?EXAM: ?DIGITAL DIAGNOSTIC BILATERAL MAMMOGRAM WITH TOMOSYNTHESIS AND CAD; ?ULTRASOUND LEFT BREAST LIMITED; ULTRASOUND RIGHT BREAST LIMITED ? ?IMPRESSION: ?1. There is a highly suspicious ill-defined mass in the upper-outer ?left breast. The mass measures at least 2.9 cm, and possibly up to ?3.8 cm. Accurate measurement is difficult due to its ill-defined ?insinuating appearance. ?  ?2.  No evidence of left axillary lymphadenopathy. ?  ?3. No persistent suspicious mammographic or targeted sonographic abnormalities in the right breast. ?  ?09/14/2021  Cancer Staging  ? Staging form: Breast, AJCC 8th Edition ?- Clinical stage from 09/14/2021: Stage IA (cT1c, cN0, cM0, G3, ER+, PR-, HER2+) - Signed by [name], MD on 09/22/2021 ?Stage prefix: Initial diagnosis ?Histologic grading system: 3 grade system ? ?  ?09/14/2021 Initial Biopsy  ? Diagnosis ?Breast, left, needle core biopsy, 1 o'clock, 4cmfn ?- INVASIVE MAMMARY CARCINOMA. SEE NOTE ?Diagnosis Note ?Carcinoma measures 1.3 cm in greatest linear dimension and appears grade 2-3. ? ?Immunohistochemical stain for E-cadherin is negative, consistent with a lobular phenotype. ? ?PROGNOSTIC INDICATORS ?Results: ?The tumor cells are POSITIVE for Her2 (3+). ?Estrogen Receptor: 50%, POSITIVE, MODERATE-WEAK STAINING INTENSITY ?Progesterone Receptor: 0%, NEGATIVE ?Proliferation Marker Ki67: 15% ?  ?09/18/2021 Initial Diagnosis  ? Malignant neoplasm of upper-outer quadrant of left breast in female, estrogen receptor positive ([place]) ?  ?09/21/2021 Imaging  ? EXAM: ?BILATERAL BREAST MRI WITH AND WITHOUT CONTRAST ? ?IMPRESSION: ?1. 3 cm biopsy-proven malignancy within the UPPER-OUTER LEFT breast. ?No evidence of multifocal, multicentric or contralateral malignancy. ?No abnormal lymph nodes. ?  ?10/14/2021 -  Chemotherapy  ? Patient is on Treatment Plan : BREAST  Docetaxel + Carboplatin + Trastuzumab + Pertuzumab  (TCHP) q21d   ?   ? ? ? ?RISK FACTORS:  ?Mammogram within the last year: yes ?Colonoscopy: yes;  most recent in 2017 . ?Menarche was at age 48.  ?Nulliparous.  ?Menopausal status: postmenopausal.  ?OCP use for approximately 2 years.  ?HRT use: 0 years. ? ?Past Medical History:  ?Diagnosis Date  ? Breast cancer ([place]) 09/15/21  ? Hyperlipidemia   ? Osteoporosis   ? ? ?Past Surgical History:  ?  Procedure Laterality Date  ? SIGMOIDOSCOPY  1999  ? TUBAL LIGATION    ? ? ?FAMILY HISTORY:  ?We obtained a detailed, 4-generation family history.  Significant diagnoses are listed below: ?Family History  ?Problem Relation Age of Onset  ?  Melanoma Mother   ?     or other skin cancer; dx after 23; no systemic therapy needed  ? Other Sister 93  ?     benign breast mass  ? Obesity Sister   ? Other Sister 70  ?     benign breast mass  ? Cancer Paternal Uncle   ?     unknown type; mets; d. late 75s  ? ? ? ?Katherine Donovan is unaware of previous family history of genetic testing for hereditary cancer risks. There is no reported Ashkenazi Jewish ancestry. There is no known consanguinity. ? ?GENETIC COUNSELING ASSESSMENT: [name] is a 76 y.o. female with a personal and family history of cancer which is not highly suggestive of a hereditary cancer syndrome. We, therefore, discussed and recommended the following at today's visit.  ? ?DISCUSSION: We discussed that, in general, most cancer is not inherited in families, but instead is sporadic or familial. Sporadic cancers occur by chance and typically happen at older ages (>50 years) as this type of cancer is caused by genetic changes acquired during an individual?s lifetime. Some families have more cancers than would be expected by chance; however, the ages or types of cancer are not consistent with a known genetic mutation or known genetic mutations have been ruled out. This type of familial cancer is thought to be due to a combination of multiple genetic, environmental, hormonal, and lifestyle factors. While this combination of factors likely increases the risk of cancer, the exact source of this risk is not currently identifiable or testable.   ? ?We discussed that approximately 5-10% of cancer is hereditary, meaning that it is due to a mutation in a single gene that is passed down from generation to generation in a family. Most hereditary cases of hereditary breast cancer are associated with mutations in BRCA1/2. There are other genes that can be associated an increased risk for breast cancer. We discussed that testing can be beneficial for several reasons, including knowing about other cancer risks,  identifying potential screening and risk-reduction options that may be appropriate, and to understand if other family members could be at risk for cancer and allow them to undergo genetic testing. ? ?We discussed with Ms. Dupee that the personal and family history does not meet insurance or NCCN criteria for genetic testing and, therefore, is not highly consistent with a familial hereditary cancer syndrome.  We feel she is at low risk to harbor a gene mutation associated with such a condition. Ms. Laplante was still interested in pursuing genetic testing; we discussed that this reasonable given that she has limited information about certain family members, including the primary of her paternal uncle's metastatic cancer.   ? ?The CancerNext-Expanded gene panel offered by [facility] and includes sequencing, rearrangement, and RNA analysis for the following 77 genes: AIP, ALK, APC, ATM, AXIN2, BAP1, BARD1, BLM, BMPR1A, BRCA1, BRCA2, BRIP1, CDC73, CDH1, CDK4, CDKN1B, CDKN2A, CHEK2, CTNNA1, DICER1, FANCC, FH, FLCN, GALNT12, KIF1B, LZTR1, MAX, MEN1, MET, MLH1, MSH2, MSH3, MSH6, MUTYH, NBN, NF1, NF2, NTHL1, PALB2, PHOX2B, PMS2, POT1, PRKAR1A, PTCH1, PTEN, RAD51C, RAD51D, RB1, RECQL, RET, SDHA, SDHAF2, SDHB, SDHC, SDHD, SMAD4, SMARCA4, SMARCB1, SMARCE1, STK11, SUFU, TMEM127, TP53, TSC1, TSC2, VHL and XRCC2 (sequencing and deletion/duplication);  EGFR, EGLN1, HOXB13, KIT, MITF, PDGFRA, POLD1, and POLE (sequencing only); EPCAM and GREM1 (deletion/duplication only).  ? ?PLAN: After considering the risks, benefits, and limitations, Ms. Sweigert provided informed consent to pursue genetic testing and the blood sample was sent to [facility] for analysis of the CancerNext-Expanded +RNAinsight Panel. Results should be available within approximately 3 weeks' time, at which point they will be disclosed by telephone to Ms. Seabolt, as will any additional recommendations warranted by these results. Ms. Marich will receive a  summary of her genetic counseling visit and a copy of her results once available. This information will also be available in Epic.  ?Lastly, we encouraged Ms. Kaczmarek to remain in contact with cancer genetics annuall

## 2021-09-25 ENCOUNTER — Encounter (HOSPITAL_BASED_OUTPATIENT_CLINIC_OR_DEPARTMENT_OTHER): Payer: Self-pay | Admitting: General Surgery

## 2021-09-25 ENCOUNTER — Other Ambulatory Visit: Payer: Self-pay

## 2021-09-28 ENCOUNTER — Telehealth: Payer: Self-pay

## 2021-09-28 ENCOUNTER — Other Ambulatory Visit: Payer: Self-pay

## 2021-09-28 DIAGNOSIS — Z170 Estrogen receptor positive status [ER+]: Secondary | ICD-10-CM

## 2021-09-28 NOTE — Telephone Encounter (Addendum)
[employer] 2021-05 - Specimen Collection Study to Evaluate Biomarkers in Subjects with Cancer  ?[id number] - TREATMENT OF REFRACTORY NAUSEA ? ?Called patient to follow-up on interest in above-listed studies. Patient stated she was interested in participating and requested to come in at 1130am on 3/29 to consent. ? ?Vickii Penna, RN, BSN, CPN ?Clinical Research Nurse I ?[phone] ? ?09/28/2021 11:17 AM ? ?

## 2021-09-29 ENCOUNTER — Other Ambulatory Visit: Payer: Self-pay

## 2021-09-29 ENCOUNTER — Inpatient Hospital Stay: Payer: Medicare Other

## 2021-09-29 ENCOUNTER — Ambulatory Visit (HOSPITAL_COMMUNITY)
Admission: RE | Admit: 2021-09-29 | Discharge: 2021-09-29 | Disposition: A | Discharge status: Home / Self Care | Payer: Medicare Other | Source: Ambulatory Visit | Attending: Hematology | Admitting: Hematology

## 2021-09-29 DIAGNOSIS — Z01818 Encounter for other preprocedural examination: Secondary | ICD-10-CM | POA: Insufficient documentation

## 2021-09-29 DIAGNOSIS — I517 Cardiomegaly: Secondary | ICD-10-CM | POA: Diagnosis not present

## 2021-09-29 DIAGNOSIS — Z170 Estrogen receptor positive status [ER+]: Secondary | ICD-10-CM | POA: Diagnosis not present

## 2021-09-29 DIAGNOSIS — C50412 Malignant neoplasm of upper-outer quadrant of left female breast: Secondary | ICD-10-CM | POA: Diagnosis not present

## 2021-09-29 DIAGNOSIS — Z0189 Encounter for other specified special examinations: Secondary | ICD-10-CM | POA: Diagnosis not present

## 2021-09-29 LAB — ECHOCARDIOGRAM COMPLETE
AR max vel: 2.49 cm2
AV Peak grad: 6 mmHg
Ao pk vel: 1.22 m/s
Area-P 1/2: 3.08 cm2
Calc EF: 63.8 %
S' Lateral: 2 cm
Single Plane A2C EF: 62.8 %
Single Plane A4C EF: 65.1 %

## 2021-09-29 NOTE — Progress Notes (Signed)

## 2021-09-30 ENCOUNTER — Encounter: Payer: Self-pay | Admitting: *Deleted

## 2021-09-30 ENCOUNTER — Inpatient Hospital Stay: Payer: Medicare Other

## 2021-09-30 ENCOUNTER — Telehealth: Payer: Self-pay | Admitting: *Deleted

## 2021-09-30 DIAGNOSIS — C50412 Malignant neoplasm of upper-outer quadrant of left female breast: Secondary | ICD-10-CM

## 2021-09-30 LAB — CBC WITH DIFFERENTIAL (CANCER CENTER ONLY)
Abs Immature Granulocytes: 0.02 10*3/uL (ref 0.00–0.07)
Basophils Absolute: 0 10*3/uL (ref 0.0–0.1)
Basophils Relative: 0 %
Eosinophils Absolute: 0.2 10*3/uL (ref 0.0–0.5)
Eosinophils Relative: 2 %
HCT: 42.9 % (ref 36.0–46.0)
Hemoglobin: 14.5 g/dL (ref 12.0–15.0)
Immature Granulocytes: 0 %
Lymphocytes Relative: 23 %
Lymphs Abs: 1.7 10*3/uL (ref 0.7–4.0)
MCH: 30.7 pg (ref 26.0–34.0)
MCHC: 33.8 g/dL (ref 30.0–36.0)
MCV: 90.9 fL (ref 80.0–100.0)
Monocytes Absolute: 0.8 10*3/uL (ref 0.1–1.0)
Monocytes Relative: 10 %
Neutro Abs: 4.8 10*3/uL (ref 1.7–7.7)
Neutrophils Relative %: 65 %
Platelet Count: 261 10*3/uL (ref 150–400)
RBC: 4.72 MIL/uL (ref 3.87–5.11)
RDW: 12.2 % (ref 11.5–15.5)
WBC Count: 7.5 10*3/uL (ref 4.0–10.5)
nRBC: 0 % (ref 0.0–0.2)

## 2021-09-30 LAB — CMP (CANCER CENTER ONLY)
ALT: 19 U/L (ref 0–44)
AST: 27 U/L (ref 15–41)
Albumin: 4.2 g/dL (ref 3.5–5.0)
Alkaline Phosphatase: 47 U/L (ref 38–126)
Anion gap: 6 (ref 5–15)
BUN: 13 mg/dL (ref 8–23)
CO2: 29 mmol/L (ref 22–32)
Calcium: 9.8 mg/dL (ref 8.9–10.3)
Chloride: 107 mmol/L (ref 98–111)
Creatinine: 0.68 mg/dL (ref 0.44–1.00)
GFR, Estimated: >60 mL/min (ref >60)
Glucose, Bld: 99 mg/dL (ref 70–99)
Potassium: 3.9 mmol/L (ref 3.5–5.1)
Sodium: 142 mmol/L (ref 135–145)
Total Bilirubin: 0.5 mg/dL (ref 0.3–1.2)
Total Protein: 6.6 g/dL (ref 6.5–8.1)

## 2021-09-30 LAB — RESEARCH LABS

## 2021-09-30 NOTE — Progress Notes (Signed)
Pharmacist Chemotherapy Monitoring - Initial Assessment   ? ?Anticipated start date: 10/07/21  ? ?The following has been reviewed per standard work regarding the patient's treatment regimen: ?The patient's diagnosis, treatment plan and drug doses, and organ/hematologic function ?Lab orders and baseline tests specific to treatment regimen  ?The treatment plan start date, drug sequencing, and pre-medications ?Prior authorization status  ?Patient's documented medication list, including drug-drug interaction screen and prescriptions for anti-emetics and supportive care specific to the treatment regimen ?The drug concentrations, fluid compatibility, administration routes, and timing of the medications to be used ?The patient's access for treatment and lifetime cumulative dose history, if applicable  ?The patient's medication allergies and previous infusion related reactions, if applicable  ? ?Changes made to treatment plan:  ?treatment plan date ? ?Follow up needed:  ?Pending authorization for treatment  ? ? ?Adelina Mings, [facility], ?09/30/2021  12:07 PM  ?

## 2021-09-30 NOTE — Research (Signed)
[id number] - TREATMENT OF REFRACTORY NAUSEA ? ?This Nurse has reviewed this patient's inclusion and exclusion criteria and confirmed Katherine Donovan is eligible for study participation.  Patient will continue with enrollment. ? ?Menopausal status (women only): Katherine Donovan is postmenopausal and status-post tubal ligation. ? ?Exclusion criteria reviewed with patient; she denies all.  ? ?Vickii Penna, RN, BSN, CPN ?Clinical Research Nurse I ?[phone] ? ?09/30/2021 1:05 PM ? ?

## 2021-09-30 NOTE — Research (Signed)
[id number] - TREATMENT OF REFRACTORY NAUSEA ? ?Patient Katherine Donovan, was registered to the above listed study, assigned study #[id number]. Randomization is not required for Cycle 1 of this study. Randomization information to follow if eligible to proceed with Cycle 2. ? ?Vickii Penna, RN, BSN, CPN ?Clinical Research Nurse I ?[phone] ? ?09/30/2021 1:49 PM ? ?

## 2021-09-30 NOTE — Telephone Encounter (Signed)
Left message for a return phone call to follow up from [facility] 3/22. ?

## 2021-09-30 NOTE — Research (Signed)
[employer] 2021-05 - Specimen Collection Study to Evaluate Biomarkers in Subjects with Cancer  ? ?09/30/21 ? ?This Coordinator has reviewed this patient's inclusion and exclusion criteria and confirmed Katherine Donovan is eligible for study participation.  Patient will continue with enrollment. ? ?Clabe Seal ?Clinical Research Coordinator I  ?09/30/21 1:39 PM ? ?

## 2021-09-30 NOTE — Research (Signed)
[id number] - TREATMENT OF REFRACTORY NAUSEA ? ?09/30/21 ? ?This Coordinator has reviewed this patient's inclusion and exclusion criteria and confirmed GYANNA JAREMA is eligible for study participation.  Patient will continue with enrollment. ? ?Menopausal status (women only): MAYERLI KIRST is post menopausal. ? ?Clabe Seal ?Clinical Research Coordinator I  ?09/30/21 1:39 PM ? ? ?

## 2021-09-30 NOTE — Research (Signed)
[id number] - TREATMENT OF REFRACTORY NAUSEA ? ?Patient Katherine Donovan was identified by Dr Burr Medico as a potential candidate for the above listed study.  This Clinical Research Nurse met with Katherine Donovan, [name] on 09/30/21 in a manner and location that ensures patient privacy to discuss participation in the above listed research study.  Patient is Accompanied by her husband .  Patient was previously provided with informed consent documents.  Patient confirmed they have read the informed consent documents. ? ?As outlined in the informed consent form, this Nurse and Katherine Donovan discussed the purpose of the research study, the investigational nature of the study, study procedures and requirements for study participation, potential risks and benefits of study participation, as well as alternatives to participation.  This study is blinded or double-blinded. The patient understands participation is voluntary and they may withdraw from study participation at any time.  Each study arm was reviewed, and randomization discussed.  Potential side effects were reviewed with patient as outlined in the consent form, and patient made aware there may be side effects not yet known. The chance of receiving placebo was discussed. The chance of receiving placebo was discussed. Randomization, investigational drug, and placebo apply to Phase 2 of the study. Patient understands enrollment is pending full eligibility review.  ? ?Confidentiality and how the patient's information will be used as part of study participation were discussed.  Patient was informed there is not reimbursement provided for their time and effort spent on trial participation.  The patient is encouraged to discuss research study participation with their insurance provider to determine what costs they may incur as part of study participation, including research related injury.   ? ?All questions were answered to patient's satisfaction.  The informed  consent and separate HIPAA Authorization was reviewed page by page.  The patient's mental and emotional status is appropriate to provide informed consent, and the patient verbalizes an understanding of study participation.  Patient has agreed to participate in the above listed research study and has voluntarily signed the informed consent (protocol version date 07/08/21; [place] active date 08/05/21) and separate HIPAA Authorization ([place] active date 01/13/21) on 09/30/21 at 1140AM.  The patient was provided with a copy of the signed informed consent form and separate HIPAA Authorization for their reference.  No study specific procedures were obtained prior to the signing of the informed consent document.  Approximately 20 minutes were spent with the patient reviewing the informed consent documents.  Patient was not requested to complete a Release of Information form. ? ?Vickii Penna, RN, BSN, CPN ?Clinical Research Nurse I ?[phone] ? ?09/30/2021 1:01 PM ? ?

## 2021-09-30 NOTE — Research (Signed)
[employer] 2021-05 - Specimen Collection Study to Evaluate Biomarkers in Subjects with Cancer  ? ?Patient Katherine Donovan was identified by Dr Burr Medico as a potential candidate for the above listed study.  This Clinical Research Nurse met with [name], [id number] on 09/30/21 in a manner and location that ensures patient privacy to discuss participation in the above listed research study.  Patient is Accompanied by her husband .  Patient was previously provided with informed consent documents.  Patient confirmed they have read the informed consent documents. ? ?As outlined in the informed consent form, this Nurse and Jacqlyn Larsen discussed the purpose of the research study, the investigational nature of the study, study procedures and requirements for study participation, potential risks and benefits of study participation, as well as alternatives to participation.  This study is not blinded or double-blinded. The patient understands participation is voluntary and they may withdraw from study participation at any time.  This study does not involve randomization.  This study does not involve an investigational drug or device. This study does not involve a placebo. Patient understands enrollment is pending full eligibility review.  ? ?Confidentiality and how the patient's information will be used as part of study participation were discussed.  Patient was informed there is reimbursement provided for their time and effort spent on trial participation.  The patient is encouraged to discuss research study participation with their insurance provider to determine what costs they may incur as part of study participation, including research related injury.   ? ?All questions were answered to patient's satisfaction.  The informed consent with embedded HIPAA language was reviewed page by page.  The patient's mental and emotional status is appropriate to provide informed consent, and the patient verbalizes an  understanding of study participation.  Patient has agreed to participate in the above listed research study and has voluntarily signed the informed consent with embedded HIPAA language, version 14 (revised 08/03/21) on 09/30/21 at 1150AM  The patient was provided with a copy of the signed informed consent form with embedded HIPAA language for their reference.  No study specific procedures were obtained prior to the signing of the informed consent document.  Approximately 15 minutes were spent with the patient reviewing the informed consent documents.  Patient was not requested to complete a Release of Information form. ?  ?ELIGIBILITY: This Nurse has reviewed this patient's inclusion and exclusion criteria and confirmed Katherine Donovan is eligible for study participation.  Patient will continue with enrollment. ?  ?SPECIMEN COLLECTION: Patient's blood specimen was collected using fresh venipuncture. Blood collection kit: [id number]. ?  ?TISSUE REQUEST: Tissue request sent for block from patient's biopsy on 09/14/21. ?  ?GIFT CARD: Patient was given a $26 [employer] gift card for her participation in the study. ?  ?DATA COLLECTION: The patient reports no known past medical history of hypertension, coronary artery disease, lupus, rheumatoid arthritis, diabetes, or lynch syndrome. The patient is not taking magnesium supplements. The patient does report family history melanoma in her mother, and breast cancer in two sisters. She denies any other known family history of cancer in 1st or 2nd degree relatives. The patient reports she currently consumes alcohol; 1-5 glasses of wine per week for the last five years. She denies a history of tobacco use. ? ?Vickii Penna, RN, BSN, CPN ?Clinical Research Nurse I ?[phone] ? ?09/30/2021 12:48 PM ? ?  ?

## 2021-10-06 ENCOUNTER — Other Ambulatory Visit: Payer: Self-pay

## 2021-10-06 ENCOUNTER — Encounter: Payer: Self-pay | Admitting: Hematology

## 2021-10-06 ENCOUNTER — Ambulatory Visit (HOSPITAL_BASED_OUTPATIENT_CLINIC_OR_DEPARTMENT_OTHER): Payer: Medicare Other | Admitting: Anesthesiology

## 2021-10-06 ENCOUNTER — Encounter (HOSPITAL_BASED_OUTPATIENT_CLINIC_OR_DEPARTMENT_OTHER)
Admission: RE | Disposition: A | Discharge status: Home / Self Care | Payer: Self-pay | Source: Home / Self Care | Attending: General Surgery

## 2021-10-06 ENCOUNTER — Ambulatory Visit (HOSPITAL_BASED_OUTPATIENT_CLINIC_OR_DEPARTMENT_OTHER)
Admission: RE | Admit: 2021-10-06 | Discharge: 2021-10-06 | Disposition: A | Discharge status: Home / Self Care | Payer: Medicare Other | Attending: General Surgery | Admitting: General Surgery

## 2021-10-06 ENCOUNTER — Encounter (HOSPITAL_BASED_OUTPATIENT_CLINIC_OR_DEPARTMENT_OTHER): Payer: Self-pay | Admitting: General Surgery

## 2021-10-06 ENCOUNTER — Ambulatory Visit (HOSPITAL_COMMUNITY): Payer: Medicare Other

## 2021-10-06 DIAGNOSIS — E785 Hyperlipidemia, unspecified: Secondary | ICD-10-CM | POA: Diagnosis not present

## 2021-10-06 DIAGNOSIS — Z170 Estrogen receptor positive status [ER+]: Secondary | ICD-10-CM | POA: Diagnosis not present

## 2021-10-06 DIAGNOSIS — Z452 Encounter for adjustment and management of vascular access device: Secondary | ICD-10-CM | POA: Insufficient documentation

## 2021-10-06 DIAGNOSIS — Z79899 Other long term (current) drug therapy: Secondary | ICD-10-CM | POA: Diagnosis not present

## 2021-10-06 DIAGNOSIS — C50912 Malignant neoplasm of unspecified site of left female breast: Secondary | ICD-10-CM

## 2021-10-06 DIAGNOSIS — Z87891 Personal history of nicotine dependence: Secondary | ICD-10-CM | POA: Insufficient documentation

## 2021-10-06 DIAGNOSIS — C50412 Malignant neoplasm of upper-outer quadrant of left female breast: Secondary | ICD-10-CM | POA: Insufficient documentation

## 2021-10-06 HISTORY — DX: Rosacea, unspecified: L71.9

## 2021-10-06 HISTORY — PX: PORTACATH PLACEMENT: SHX2246

## 2021-10-06 SURGERY — INSERTION, TUNNELED CENTRAL VENOUS DEVICE, WITH PORT
Anesthesia: General | Site: Chest | Laterality: Right

## 2021-10-06 MED ORDER — CHLORHEXIDINE GLUCONATE CLOTH 2 % EX PADS: 6.0000 | MEDICATED_PAD | Freq: Once | CUTANEOUS | Status: DC

## 2021-10-06 MED ORDER — CEFAZOLIN SODIUM-DEXTROSE 2-4 GM/100ML-% IV SOLN
2.0000 g | INTRAVENOUS | Status: AC
Start: 2021-10-06 — End: 2021-10-06
  Administered 2021-10-06: 2 g via INTRAVENOUS

## 2021-10-06 MED ORDER — PROPOFOL 10 MG/ML IV BOLUS
INTRAVENOUS | Status: AC
  Filled 2021-10-06: qty 20

## 2021-10-06 MED ORDER — DEXAMETHASONE SODIUM PHOSPHATE 10 MG/ML IJ SOLN
INTRAMUSCULAR | Status: AC
  Filled 2021-10-06: qty 1

## 2021-10-06 MED ORDER — FENTANYL CITRATE (PF) 100 MCG/2ML IJ SOLN: 25.0000 ug | INTRAMUSCULAR | Status: DC | PRN

## 2021-10-06 MED ORDER — ENSURE PRE-SURGERY PO LIQD: 296.0000 mL | Freq: Once | ORAL | Status: DC

## 2021-10-06 MED ORDER — LACTATED RINGERS IV SOLN: INTRAVENOUS | Status: DC

## 2021-10-06 MED ORDER — CEFAZOLIN SODIUM-DEXTROSE 2-4 GM/100ML-% IV SOLN
INTRAVENOUS | Status: AC
  Filled 2021-10-06: qty 100

## 2021-10-06 MED ORDER — PROPOFOL 10 MG/ML IV BOLUS
INTRAVENOUS | Status: DC | PRN
Start: 2021-10-06 — End: 2021-10-06
  Administered 2021-10-06: 100 mg via INTRAVENOUS

## 2021-10-06 MED ORDER — HEPARIN SOD (PORK) LOCK FLUSH 100 UNIT/ML IV SOLN
INTRAVENOUS | Status: DC | PRN
  Administered 2021-10-06: 500 [IU] via INTRAVENOUS

## 2021-10-06 MED ORDER — EPHEDRINE SULFATE (PRESSORS) 50 MG/ML IJ SOLN
INTRAMUSCULAR | Status: DC | PRN
  Administered 2021-10-06: 10 mg via INTRAVENOUS

## 2021-10-06 MED ORDER — LIDOCAINE HCL (CARDIAC) PF 100 MG/5ML IV SOSY
PREFILLED_SYRINGE | INTRAVENOUS | Status: DC | PRN
  Administered 2021-10-06: 40 mg via INTRATRACHEAL

## 2021-10-06 MED ORDER — LIDOCAINE 2% (20 MG/ML) 5 ML SYRINGE
INTRAMUSCULAR | Status: AC
  Filled 2021-10-06: qty 5

## 2021-10-06 MED ORDER — ACETAMINOPHEN 500 MG PO TABS
ORAL_TABLET | ORAL | Status: AC
  Filled 2021-10-06: qty 2

## 2021-10-06 MED ORDER — FENTANYL CITRATE (PF) 100 MCG/2ML IJ SOLN
INTRAMUSCULAR | Status: AC
  Filled 2021-10-06: qty 2

## 2021-10-06 MED ORDER — ACETAMINOPHEN 500 MG PO TABS
1000.0000 mg | ORAL_TABLET | ORAL | Status: AC
  Administered 2021-10-06: 1000 mg via ORAL

## 2021-10-06 MED ORDER — ONDANSETRON HCL 4 MG/2ML IJ SOLN
INTRAMUSCULAR | Status: AC
  Filled 2021-10-06: qty 2

## 2021-10-06 MED ORDER — DEXAMETHASONE SODIUM PHOSPHATE 10 MG/ML IJ SOLN
INTRAMUSCULAR | Status: DC | PRN
  Administered 2021-10-06: 5 mg via INTRAVENOUS

## 2021-10-06 MED ORDER — HEPARIN (PORCINE) IN NACL 2-0.9 UNITS/ML
INTRAMUSCULAR | Status: AC | PRN
  Administered 2021-10-06: 1

## 2021-10-06 MED ORDER — BUPIVACAINE HCL (PF) 0.25 % IJ SOLN
INTRAMUSCULAR | Status: DC | PRN
  Administered 2021-10-06: 5 mL

## 2021-10-06 MED ORDER — FENTANYL CITRATE (PF) 100 MCG/2ML IJ SOLN
INTRAMUSCULAR | Status: DC | PRN
  Administered 2021-10-06: 50 ug via INTRAVENOUS

## 2021-10-06 MED ORDER — ONDANSETRON HCL 4 MG/2ML IJ SOLN
INTRAMUSCULAR | Status: DC | PRN
  Administered 2021-10-06: 4 mg via INTRAVENOUS

## 2021-10-06 SURGICAL SUPPLY — 53 items
ADH SKN CLS APL DERMABOND .7 (GAUZE/BANDAGES/DRESSINGS) ×1
APL PRP STRL LF DISP 70% ISPRP (MISCELLANEOUS) ×1
APL SKNCLS STERI-STRIP NONHPOA (GAUZE/BANDAGES/DRESSINGS) ×1
BAG DECANTER FOR FLEXI CONT (MISCELLANEOUS) ×2 IMPLANT
BENZOIN TINCTURE PRP APPL 2/3 (GAUZE/BANDAGES/DRESSINGS) ×2 IMPLANT
BLADE SURG 11 STRL SS (BLADE) ×2 IMPLANT
BLADE SURG 15 STRL LF DISP TIS (BLADE) ×1 IMPLANT
BLADE SURG 15 STRL SS (BLADE) ×2
CANISTER SUCT 1200ML W/VALVE (MISCELLANEOUS) IMPLANT
CHLORAPREP W/TINT 26 (MISCELLANEOUS) ×2 IMPLANT
COVER BACK TABLE 60X90IN (DRAPES) ×2 IMPLANT
COVER MAYO STAND STRL (DRAPES) ×2 IMPLANT
COVER PROBE 5X48 (MISCELLANEOUS) ×2
DERMABOND ADVANCED (GAUZE/BANDAGES/DRESSINGS) ×1
DERMABOND ADVANCED .7 DNX12 (GAUZE/BANDAGES/DRESSINGS) ×1 IMPLANT
DRAPE C-ARM 42X72 X-RAY (DRAPES) ×2 IMPLANT
DRAPE LAPAROSCOPIC ABDOMINAL (DRAPES) ×2 IMPLANT
DRAPE UTILITY XL STRL (DRAPES) ×2 IMPLANT
DRSG TEGADERM 4X4.75 (GAUZE/BANDAGES/DRESSINGS) ×2 IMPLANT
ELECT COATED BLADE 2.86 ST (ELECTRODE) ×2 IMPLANT
ELECT REM PT RETURN 9FT ADLT (ELECTROSURGICAL) ×2
ELECTRODE REM PT RTRN 9FT ADLT (ELECTROSURGICAL) ×1 IMPLANT
GAUZE 4X4 16PLY ~~LOC~~+RFID DBL (SPONGE) ×3 IMPLANT
GAUZE SPONGE 4X4 12PLY STRL LF (GAUZE/BANDAGES/DRESSINGS) ×2 IMPLANT
GLOVE SURG ENC MOIS LTX SZ7 (GLOVE) ×2 IMPLANT
GLOVE SURG POLYISO LF SZ7 (GLOVE) ×1 IMPLANT
GLOVE SURG UNDER POLY LF SZ7 (GLOVE) ×1 IMPLANT
GLOVE SURG UNDER POLY LF SZ7.5 (GLOVE) ×2 IMPLANT
GOWN STRL REUS W/ TWL LRG LVL3 (GOWN DISPOSABLE) ×2 IMPLANT
GOWN STRL REUS W/TWL LRG LVL3 (GOWN DISPOSABLE) ×4
IV KIT MINILOC 20X1 SAFETY (NEEDLE) IMPLANT
KIT CVR 48X5XPRB PLUP LF (MISCELLANEOUS) IMPLANT
KIT PORT POWER 8FR ISP CVUE (Port) ×1 IMPLANT
NDL HYPO 25X1 1.5 SAFETY (NEEDLE) ×1 IMPLANT
NDL SAFETY ECLIPSE 18X1.5 (NEEDLE) IMPLANT
NEEDLE HYPO 18GX1.5 SHARP (NEEDLE)
NEEDLE HYPO 25X1 1.5 SAFETY (NEEDLE) ×2 IMPLANT
PACK BASIN DAY SURGERY FS (CUSTOM PROCEDURE TRAY) ×2 IMPLANT
PENCIL SMOKE EVACUATOR (MISCELLANEOUS) ×2 IMPLANT
SLEEVE SCD COMPRESS KNEE MED (STOCKING) ×2 IMPLANT
SPIKE FLUID TRANSFER (MISCELLANEOUS) IMPLANT
STRIP CLOSURE SKIN 1/2X4 (GAUZE/BANDAGES/DRESSINGS) ×2 IMPLANT
SUT MNCRL AB 4-0 PS2 18 (SUTURE) ×2 IMPLANT
SUT PROLENE 2 0 SH DA (SUTURE) ×2 IMPLANT
SUT SILK 2 0 TIES 17X18 (SUTURE)
SUT SILK 2-0 18XBRD TIE BLK (SUTURE) IMPLANT
SUT VIC AB 3-0 SH 27 (SUTURE) ×2
SUT VIC AB 3-0 SH 27X BRD (SUTURE) ×1 IMPLANT
SYR 5ML LUER SLIP (SYRINGE) ×2 IMPLANT
SYR CONTROL 10ML LL (SYRINGE) ×2 IMPLANT
TOWEL GREEN STERILE FF (TOWEL DISPOSABLE) ×2 IMPLANT
TUBE CONNECTING 20X1/4 (TUBING) IMPLANT
YANKAUER SUCT BULB TIP NO VENT (SUCTIONS) IMPLANT

## 2021-10-06 NOTE — Anesthesia Preprocedure Evaluation (Addendum)
Anesthesia Evaluation  ?Patient identified by MRN, date of birth, ID band ?Patient awake ? ? ? ?Reviewed: ?Allergy & Precautions, NPO status , Patient's Chart, lab work & pertinent test results ? ?Airway ?Mallampati: I ? ?TM Distance: >3 FB ?Neck ROM: Full ? ? ? Dental ?no notable dental hx. ?(+) Teeth Intact, Dental Advisory Given ?  ?Pulmonary ?neg pulmonary ROS, former smoker,  ?  ?Pulmonary exam normal ?breath sounds clear to auscultation ? ? ? ? ? ? Cardiovascular ?negative cardio ROS ?Normal cardiovascular exam ?Rhythm:Regular Rate:Normal ? ?HLD ? ?TTE 2023 ??1. Left ventricular ejection fraction, by estimation, is 60 to 65%. Left  ?ventricular ejection fraction by 3D volume is 67 %. The left ventricle has  ?normal function. The left ventricle has no regional wall motion  ?abnormalities. There is moderate  ?asymmetric left ventricular hypertrophy of the basal-septal segment. Left  ?ventricular diastolic parameters are consistent with Grade I diastolic  ?dysfunction (impaired relaxation).  ??2. Right ventricular systolic function is normal. The right ventricular  ?size is normal. There is normal pulmonary artery systolic pressure.  ??3. The mitral valve is normal in structure. No evidence of mitral valve  ?regurgitation. No evidence of mitral stenosis.  ??4. The aortic valve is tricuspid. Aortic valve regurgitation is not  ?visualized. No aortic stenosis is present.  ??5. The inferior vena cava is normal in size with greater than 50%  ?respiratory variability, suggesting right atrial pressure of 3 mmHg.  ?  ?Neuro/Psych ?negative neurological ROS ? negative psych ROS  ? GI/Hepatic ?negative GI ROS, Neg liver ROS,   ?Endo/Other  ?negative endocrine ROS ? Renal/GU ?negative Renal ROS  ?negative genitourinary ?  ?Musculoskeletal ?negative musculoskeletal ROS ?(+)  ? Abdominal ?  ?Peds ? Hematology ?negative hematology ROS ?(+)   ?Anesthesia Other Findings ? ?  Reproductive/Obstetrics ? ?  ? ? ? ? ? ? ? ? ? ? ? ? ? ?  ?  ? ? ? ? ? ? ? ?Anesthesia Physical ?Anesthesia Plan ? ?ASA: 2 ? ?Anesthesia Plan: General  ? ?Post-op Pain Management: Tylenol PO (pre-op)* and Toradol IV (intra-op)*  ? ?Induction: Intravenous ? ?PONV Risk Score and Plan: 3 and Ondansetron, Dexamethasone and Treatment may vary due to age or medical condition ? ?Airway Management Planned: LMA ? ?Additional Equipment:  ? ?Intra-op Plan:  ? ?Post-operative Plan: Extubation in OR ? ?Informed Consent: I have reviewed the patients History and Physical, chart, labs and discussed the procedure including the risks, benefits and alternatives for the proposed anesthesia with the patient or authorized representative who has indicated his/her understanding and acceptance.  ? ? ? ?Dental advisory given ? ?Plan Discussed with: CRNA ? ?Anesthesia Plan Comments:   ? ? ? ? ? ? ?Anesthesia Quick Evaluation ? ?

## 2021-10-06 NOTE — Interval H&P Note (Signed)
History and Physical Interval Note: ? ?10/06/2021 ?1:07 PM ? ?Katherine Donovan  has presented today for surgery, with the diagnosis of BREAST CANCER.  The various methods of treatment have been discussed with the patient and family. After consideration of risks, benefits and other options for treatment, the patient has consented to  Procedure(s): ?INSERTION PORT-A-CATH (N/A) as a surgical intervention.  The patient's history has been reviewed, patient examined, no change in status, stable for surgery.  I have reviewed the patient's chart and labs.  Questions were answered to the patient's satisfaction.   ? ? ?[name] ? ? ?

## 2021-10-06 NOTE — Anesthesia Postprocedure Evaluation (Signed)
Anesthesia Post Note ? ?Patient: Katherine Donovan ? ?Procedure(s) Performed: INSERTION PORT-A-CATH (Right: Chest) ? ?  ? ?Patient location during evaluation: PACU ?Anesthesia Type: General ?Level of consciousness: awake and alert ?Pain management: pain level controlled ?Vital Signs Assessment: post-procedure vital signs reviewed and stable ?Respiratory status: spontaneous breathing, nonlabored ventilation, respiratory function stable and patient connected to nasal cannula oxygen ?Cardiovascular status: blood pressure returned to baseline and stable ?Postop Assessment: no apparent nausea or vomiting ?Anesthetic complications: no ? ? ?No notable events documented. ? ?Last Vitals:  ?Vitals:  ? 10/06/21 1415 10/06/21 1431  ?BP: (!) 142/74 (!) 167/89  ?Pulse: 70 68  ?Resp: 19 15  ?Temp:  36.6 ?C  ?SpO2: 98% 98%  ?  ?Last Pain:  ?Vitals:  ? 10/06/21 1431  ?TempSrc:   ?PainSc: 0-No pain  ? ? ?  ?  ?  ?  ?  ?  ? ?Draya Felker L Marquis Down ? ? ? ? ?

## 2021-10-06 NOTE — Progress Notes (Signed)
Called pt to introduce myself as her Arboriculturist and to discuss the [employer].  Pt has 2 insurances so copay assistance isn't needed.  I left a msg requesting she return my call if she's interested in applying for the grant.  ?

## 2021-10-06 NOTE — Op Note (Signed)
Preoperative diagnosis: Clinical stage II left breast cancer ?Postoperative diagnosis: Same as above ?Procedure: Right internal jugular port placement  ?Surgeon: Dr. Matt Wakefield ?Anesthesia: General ?Estimated blood loss: Minimal ?Specimens: None ?Drains: None ?Complications: None ?Special count was correct completion ?Disposition recovery stable edition ? ?Indications: 75-year-old with a left breast cancer. The left side had a breast mass that on ultrasound and mammogram measuring 2.9 x 2.7 x 2.1 cm. There was a concern is exactly 3.8 cm. Her axillary ultrasound was negative. She had an MRI by the time she is seen and it shows a 3 x 2.7 x 2.5 cm mass. There were no abnormalities. 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 We planned on port placement for systemic therapy.  ? ?Procedure: After informed consent was obtained she was taken to the operating room.   She was given antibiotics.  SCDs were in place.  She was placed under general anesthesia without complication.  She was prepped and draped in a standard sterile surgical fashion.  Surgical timeout was then performed. ? ?I then identified her right internal jugular vein with the ultrasound.  I made a small nick in the skin.  I then accessed this on the first pass with the needle. The wire was placed.  This was confirmed to be in the vein by the ultrasound as well as by fluoroscopy.  I then placed local into the area below her clavicle where the pocket was made.  I made incision and developed a pocket.  I then tunneled the line between the 2 sites.  The dilator was then placed over the wire.  This was done using fluoroscopy.  The wire was then removed.  The line was placed through the sheath and the sheath removed.  I pulled the line back to be in the distal cava near the cavoatrial junction.  This was a good position upon completion.  I then hooked the port  up to the line.  I sutured this position with 2-0 Prolene suture.  I then accessed the port and left accessed for chemotherapy tomorrow.   I aspirated blood and flushed easily. I placed heparin in the port.  I then got a final completion film that showed this all to be in good position. I accessed the port for chemotherapy tomorrow.  I then closed with 3-0 Vicryl 4-0 Monocryl.  Glue was placed.  She tolerated this well and was transferred to recovery. ?

## 2021-10-06 NOTE — Anesthesia Procedure Notes (Signed)
Procedure Name: LMA Insertion ?Date/Time: 10/06/2021 1:31 PM ?Performed by: Glory Buff, CRNA ?Pre-anesthesia Checklist: Patient identified, Emergency Drugs available, Suction available and Patient being monitored ?Patient Re-evaluated:Patient Re-evaluated prior to induction ?Oxygen Delivery Method: Circle system utilized ?Preoxygenation: Pre-oxygenation with 100% oxygen ?Induction Type: IV induction ?LMA: LMA inserted ?LMA Size: 3.0 ?Number of attempts: 1 ?Placement Confirmation: positive ETCO2 ?Tube secured with: Tape ?Dental Injury: Teeth and Oropharynx as per pre-operative assessment  ? ? ? ? ?

## 2021-10-06 NOTE — Transfer of Care (Signed)
Immediate Anesthesia Transfer of Care Note ? ?Patient: Katherine Donovan ? ?Procedure(s) Performed: INSERTION PORT-A-CATH (Right: Chest) ? ?Patient Location: PACU ? ?Anesthesia Type:General ? ?Level of Consciousness: drowsy and patient cooperative ? ?Airway & Oxygen Therapy: Patient Spontanous Breathing and Patient connected to face mask oxygen ? ?Post-op Assessment: Report given to RN and Post -op Vital signs reviewed and stable ? ?Post vital signs: Reviewed and stable ? ?Last Vitals:  ?Vitals Value Taken Time  ?BP    ?Temp    ?Pulse 66 10/06/21 1412  ?Resp 14 10/06/21 1412  ?SpO2 99 % 10/06/21 1412  ?Vitals shown include unvalidated device data. ? ?Last Pain:  ?Vitals:  ? 10/06/21 1115  ?TempSrc: Oral  ?PainSc: 0-No pain  ?   ? ?Patients Stated Pain Goal: 1 (10/06/21 1115) ? ?Complications: No notable events documented. ?

## 2021-10-06 NOTE — Discharge Instructions (Addendum)
? ? ?PORT-A-CATH: POST OP INSTRUCTIONS ? ?Always review your discharge instruction sheet given to you by the facility where your surgery was performed.  ? ?A prescription for pain medication may be given to you upon discharge. Take your pain medication as prescribed, if needed. If narcotic pain medicine is not needed, then you make take acetaminophen (Tylenol) or ibuprofen (Advil) as needed.  ?Take your usually prescribed medications unless otherwise directed. ?If you need a refill on your pain medication, please contact our office. All narcotic pain medicine now requires a paper prescription.  Phoned in and fax refills are no longer allowed by law.  Prescriptions will not be filled after 5 pm or on weekends.  ?You should follow a light diet for the remainder of the day after your procedure. ?Most patients will experience some mild swelling and/or bruising in the area of the incision. It may take several days to resolve. ?It is common to experience some constipation if taking pain medication after surgery. Increasing fluid intake and taking a stool softener (such as Colace) will usually help or prevent this problem from occurring. A mild laxative (Milk of Magnesia or Miralax) should be taken according to package directions if there are no bowel movements after 48 hours.  ?Unless discharge instructions indicate otherwise, you may remove your bandages 48 hours after surgery, and you may shower at that time. You may have steri-strips (small white skin tapes) in place directly over the incision.  These strips should be left on the skin for 7-10 days.  If your surgeon used Dermabond (skin glue) on the incision, you may shower in 24 hours.  The glue will flake off over the next 2-3 weeks.  ?If your port is left accessed at the end of surgery (needle left in port), the dressing cannot get wet and should only by changed by a healthcare professional. When the port is no longer accessed (when the needle has been removed),  follow step 7.   ?ACTIVITIES:  Limit activity involving your arms for the next 72 hours. Do no strenuous exercise or activity for 1 week. You may drive when you are no longer taking prescription pain medication, you can comfortably wear a seatbelt, and you can maneuver your car. ?10.You may need to see your doctor in the office for a follow-up appointment.  Please ?      check with your doctor.  ?11.When you receive a new Port-a-Cath, you will get a product guide and  ?      ID card.  Please keep them in case you need them. ? ?WHEN TO [place] [phone]): ?Fever over 101.0 ?Chills ?Continued bleeding from incision ?Increased redness and tenderness at the site ?Shortness of breath, difficulty breathing ? ? ?The clinic staff is available to answer your questions during regular business hours. Please don?t hesitate to call and ask to speak to one of the nurses or medical assistants for clinical concerns. If you have a medical emergency, go to the nearest emergency room or call 911.  A surgeon from [facility] is always on call at the hospital.  ? ? ? ?For further information, please visit [email or web address] ? ? ? ?You may have Tylenol again after 5:30pm, if needed. ? ?Post Anesthesia Home Care Instructions ? ?Activity: ?Get plenty of rest for the remainder of the day. A responsible individual must stay with you for 24 hours following the procedure.  ?For the next 24 hours, DO NOT: ?-Drive a car ?-Paediatric nurse ?-Drink  alcoholic beverages ?-Take any medication unless instructed by your physician ?-Make any legal decisions or sign important papers. ? ?Meals: ?Start with liquid foods such as gelatin or soup. Progress to regular foods as tolerated. Avoid greasy, spicy, heavy foods. If nausea and/or vomiting occur, drink only clear liquids until the nausea and/or vomiting subsides. Call your physician if vomiting continues. ? ?Special Instructions/Symptoms: ?Your throat may feel dry  or sore from the anesthesia or the breathing tube placed in your throat during surgery. If this causes discomfort, gargle with warm salt water. The discomfort should disappear within 24 hours. ? ?If you had a scopolamine patch placed behind your ear for the management of post- operative nausea and/or vomiting: ? ?1. The medication in the patch is effective for 72 hours, after which it should be removed.  Wrap patch in a tissue and discard in the trash. Wash hands thoroughly with soap and water. ?2. You may remove the patch earlier than 72 hours if you experience unpleasant side effects which may include dry mouth, dizziness or visual disturbances. ?3. Avoid touching the patch. Wash your hands with soap and water after contact with the patch. ?    ?

## 2021-10-06 NOTE — H&P (Signed)
?76 year old with a left breast cancer. The left side had a breast mass that on ultrasound and mammogram measuring 2.9 x 2.7 x 2.1 cm. There was a concern is exactly 3.8 cm. Her axillary ultrasound was negative. She had an MRI by the time she is seen and it shows a 3 x 2.7 x 2.5 cm mass. There were no abnormalities. 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 I reviewed all this imaging with radiology and concur. I discussed patient care at tumor board this morning with med onc, rad onc, and radiology. She is here today by herself to discuss her options. ? ?Review of Systems: ?A complete review of systems was obtained from the patient. I have reviewed this information and discussed as appropriate with the patient. See HPI as well for other ROS. ? ?Review of Systems  ?HENT: Positive for tinnitus.  ?All other systems reviewed and are negative. ? ? ?Medical History: ?Past Medical History:  ?Diagnosis Date  ? History of cancer  ? Hyperlipidemia  ? ?Patient Active Problem List  ?Diagnosis  ? Malignant neoplasm of upper-outer quadrant of left breast in female, estrogen receptor positive (CMS-HCC)  ? ?Past Surgical History:  ?Procedure Laterality Date  ? Open Tubal Ligation  ?1972  ? SIGMOIDOSCOPY FLEXIBLE N/A  ?1999  ? ?No Known Allergies ? ?Current Outpatient Medications on File Prior to Visit  ?Medication Sig Dispense Refill  ? alendronate (FOSAMAX) 70 MG tablet Take 1 tablet by mouth every 7 (seven) days  ? calcium carbonate-vitamin D3 (OS-CAL 500+D) 500 mg-5 mcg (200 unit) tablet Take 1 tablet by mouth 2 (two) times daily with meals  ? rosuvastatin (CRESTOR) 10 MG tablet Take 1 tablet by mouth once daily  ? ?Family History  ?Problem Relation Age of Onset  ? High blood pressure (Hypertension) Father  ? Coronary Artery Disease (Blocked arteries around heart) Father  ? Breast cancer Sister  ? ?Social History  ? ?Tobacco  Use  ?Smoking Status Former  ? Types: Cigarettes  ? Quit date: 1969  ? Years since quitting: 54.2  ?Smokeless Tobacco Never  ? ? ?Social History  ? ?Socioeconomic History  ? Marital status: Married  ?Tobacco Use  ? Smoking status: Former  ?Types: Cigarettes  ?Quit date: 1969  ?Years since quitting: 54.2  ? Smokeless tobacco: Never  ?Vaping Use  ? Vaping Use: Never used  ?Substance and Sexual Activity  ? Alcohol use: Not Currently  ? Drug use: Not Currently  ? ?Objective:  ?Physical Exam ?Vitals reviewed.  ?Constitutional:  ?Appearance: Normal appearance.  ?Chest:  ?Breasts: ?Right: No inverted nipple, mass, nipple discharge or skin change.  ?Left: No inverted nipple, mass, nipple discharge or skin change.  ?Lymphadenopathy:  ?Upper Body:  ?Right upper body: No supraclavicular or axillary adenopathy.  ?Left upper body: No supraclavicular or axillary adenopathy.  ?Neurological:  ?Mental Status: She is alert.  ? ? ? ?Assessment and Plan:  ? ?Malignant neoplasm of upper-outer quadrant of left breast in female, estrogen receptor positive (CMS-HCC) ? ?Port placement for primary systemic therapy ? ?We discussed the staging and pathophysiology of breast cancer. We discussed all of the different options for treatment for breast cancer including surgery, chemotherapy, radiation therapy, Herceptin, and antiestrogen therapy. ?With her 2 positive tumor I think primary systemic therapy to shrink this down to facilitate lumpectomy which is what she would like is reasonable. Discussed port placement with  her today. I dont think that this will go completely away but may make easier to do lumpectomy.  ?We discussed a sentinel lymph node biopsy at time of surgery as she does not appear to having lymph node involvement right now. We discussed the performance of that with injection of tracer. We discussed that there is a chance of having a positive node with a sentinel lymph node biopsy and we will await the permanent pathology to make  any other first further decisions in terms of her treatment. We discussed up to a 5% risk lifetime of chronic shoulder pain as well as lymphedema associated with a sentinel lymph node biopsy. ?We discussed the options for treatment of the breast cancer which included lumpectomy versus a mastectomy. We discussed the performance of the lumpectomy with radioactive seed placement. We discussed a 5-10% chance of a positive margin requiring reexcision in the operating room. We also discussed that she will likely need radiation therapy if she undergoes lumpectomy. We discussed mastectomy and the postoperative care for that as well. Mastectomy can be followed by reconstruction. The decision for lumpectomy vs mastectomy has no impact on decision for chemotherapy. Most mastectomy patients will not need radiation therapy. We discussed that there is no difference in her survival whether she undergoes lumpectomy with radiation therapy or antiestrogen therapy versus a mastectomy. There is also no real difference between her recurrence in the breast. ?I will see her at end of chemotherapy portion and discuss final surgery with a repeat MRI. ?

## 2021-10-07 ENCOUNTER — Inpatient Hospital Stay: Payer: Medicare Other

## 2021-10-07 ENCOUNTER — Inpatient Hospital Stay (HOSPITAL_BASED_OUTPATIENT_CLINIC_OR_DEPARTMENT_OTHER): Payer: Medicare Other | Admitting: Hematology

## 2021-10-07 ENCOUNTER — Encounter: Payer: Self-pay | Admitting: *Deleted

## 2021-10-07 ENCOUNTER — Encounter: Payer: Self-pay | Admitting: Hematology

## 2021-10-07 ENCOUNTER — Inpatient Hospital Stay: Payer: Medicare Other | Attending: Hematology

## 2021-10-07 VITALS — BP 163/73 | HR 71 | Temp 98.3°F | Resp 19 | Ht 62.0 in | Wt 129.9 lb

## 2021-10-07 VITALS — BP 118/73 | HR 64 | Temp 98.3°F | Resp 18

## 2021-10-07 DIAGNOSIS — C50412 Malignant neoplasm of upper-outer quadrant of left female breast: Secondary | ICD-10-CM | POA: Insufficient documentation

## 2021-10-07 DIAGNOSIS — Z5189 Encounter for other specified aftercare: Secondary | ICD-10-CM | POA: Diagnosis not present

## 2021-10-07 DIAGNOSIS — R630 Anorexia: Secondary | ICD-10-CM | POA: Insufficient documentation

## 2021-10-07 DIAGNOSIS — R197 Diarrhea, unspecified: Secondary | ICD-10-CM | POA: Insufficient documentation

## 2021-10-07 DIAGNOSIS — Z95828 Presence of other vascular implants and grafts: Secondary | ICD-10-CM

## 2021-10-07 DIAGNOSIS — M810 Age-related osteoporosis without current pathological fracture: Secondary | ICD-10-CM | POA: Diagnosis not present

## 2021-10-07 DIAGNOSIS — Z170 Estrogen receptor positive status [ER+]: Secondary | ICD-10-CM

## 2021-10-07 DIAGNOSIS — Z5112 Encounter for antineoplastic immunotherapy: Secondary | ICD-10-CM | POA: Diagnosis present

## 2021-10-07 DIAGNOSIS — K5900 Constipation, unspecified: Secondary | ICD-10-CM | POA: Diagnosis not present

## 2021-10-07 DIAGNOSIS — Z79899 Other long term (current) drug therapy: Secondary | ICD-10-CM | POA: Insufficient documentation

## 2021-10-07 DIAGNOSIS — N6489 Other specified disorders of breast: Secondary | ICD-10-CM | POA: Insufficient documentation

## 2021-10-07 DIAGNOSIS — Z5111 Encounter for antineoplastic chemotherapy: Secondary | ICD-10-CM | POA: Insufficient documentation

## 2021-10-07 DIAGNOSIS — N6311 Unspecified lump in the right breast, upper outer quadrant: Secondary | ICD-10-CM | POA: Insufficient documentation

## 2021-10-07 LAB — RESEARCH LABS

## 2021-10-07 LAB — CBC WITH DIFFERENTIAL (CANCER CENTER ONLY)
Abs Immature Granulocytes: 0.03 10*3/uL (ref 0.00–0.07)
Basophils Absolute: 0 10*3/uL (ref 0.0–0.1)
Basophils Relative: 0 %
Eosinophils Absolute: 0 10*3/uL (ref 0.0–0.5)
Eosinophils Relative: 0 %
HCT: 39.4 % (ref 36.0–46.0)
Hemoglobin: 13.1 g/dL (ref 12.0–15.0)
Immature Granulocytes: 0 %
Lymphocytes Relative: 4 %
Lymphs Abs: 0.5 10*3/uL — ABNORMAL LOW (ref 0.7–4.0)
MCH: 30 pg (ref 26.0–34.0)
MCHC: 33.2 g/dL (ref 30.0–36.0)
MCV: 90.2 fL (ref 80.0–100.0)
Monocytes Absolute: 1.1 10*3/uL — ABNORMAL HIGH (ref 0.1–1.0)
Monocytes Relative: 7 %
Neutro Abs: 13 10*3/uL — ABNORMAL HIGH (ref 1.7–7.7)
Neutrophils Relative %: 89 %
Platelet Count: 254 10*3/uL (ref 150–400)
RBC: 4.37 MIL/uL (ref 3.87–5.11)
RDW: 12.1 % (ref 11.5–15.5)
WBC Count: 14.6 10*3/uL — ABNORMAL HIGH (ref 4.0–10.5)
nRBC: 0 % (ref 0.0–0.2)

## 2021-10-07 LAB — CMP (CANCER CENTER ONLY)
ALT: 17 U/L (ref 0–44)
AST: 27 U/L (ref 15–41)
Albumin: 4.1 g/dL (ref 3.5–5.0)
Alkaline Phosphatase: 45 U/L (ref 38–126)
Anion gap: 7 (ref 5–15)
BUN: 17 mg/dL (ref 8–23)
CO2: 25 mmol/L (ref 22–32)
Calcium: 9.7 mg/dL (ref 8.9–10.3)
Chloride: 104 mmol/L (ref 98–111)
Creatinine: 0.66 mg/dL (ref 0.44–1.00)
GFR, Estimated: >60 mL/min (ref >60)
Glucose, Bld: 137 mg/dL — ABNORMAL HIGH (ref 70–99)
Potassium: 4.2 mmol/L (ref 3.5–5.1)
Sodium: 136 mmol/L (ref 135–145)
Total Bilirubin: 0.4 mg/dL (ref 0.3–1.2)
Total Protein: 6.6 g/dL (ref 6.5–8.1)

## 2021-10-07 MED ORDER — DIPHENHYDRAMINE HCL 25 MG PO CAPS
50.0000 mg | ORAL_CAPSULE | Freq: Once | ORAL | Status: AC
  Administered 2021-10-07: 50 mg via ORAL
  Filled 2021-10-07: qty 2

## 2021-10-07 MED ORDER — SODIUM CHLORIDE 0.9 % IV SOLN
349.0000 mg | Freq: Once | INTRAVENOUS | Status: AC
  Administered 2021-10-07: 350 mg via INTRAVENOUS
  Filled 2021-10-07: qty 35

## 2021-10-07 MED ORDER — SODIUM CHLORIDE 0.9 % IV SOLN
75.0000 mg/m2 | Freq: Once | INTRAVENOUS | Status: AC
  Administered 2021-10-07: 120 mg via INTRAVENOUS
  Filled 2021-10-07: qty 12

## 2021-10-07 MED ORDER — SODIUM CHLORIDE 0.9 % IV SOLN: Freq: Once | INTRAVENOUS | Status: AC

## 2021-10-07 MED ORDER — HEPARIN SOD (PORK) LOCK FLUSH 100 UNIT/ML IV SOLN
500.0000 [IU] | Freq: Once | INTRAVENOUS | Status: AC | PRN
  Administered 2021-10-07: 500 [IU]

## 2021-10-07 MED ORDER — SODIUM CHLORIDE 0.9% FLUSH
10.0000 mL | INTRAVENOUS | Status: AC | PRN
  Administered 2021-10-07: 10 mL

## 2021-10-07 MED ORDER — SODIUM CHLORIDE 0.9 % IV SOLN
840.0000 mg | Freq: Once | INTRAVENOUS | Status: AC
  Administered 2021-10-07: 840 mg via INTRAVENOUS
  Filled 2021-10-07: qty 28

## 2021-10-07 MED ORDER — PALONOSETRON HCL INJECTION 0.25 MG/5ML
0.2500 mg | Freq: Once | INTRAVENOUS | Status: AC
  Administered 2021-10-07: 0.25 mg via INTRAVENOUS
  Filled 2021-10-07: qty 5

## 2021-10-07 MED ORDER — TRASTUZUMAB-DKST CHEMO 150 MG IV SOLR
8.0000 mg/kg | Freq: Once | INTRAVENOUS | Status: AC
  Administered 2021-10-07: 462 mg via INTRAVENOUS
  Filled 2021-10-07: qty 22

## 2021-10-07 MED ORDER — SODIUM CHLORIDE 0.9% FLUSH
10.0000 mL | INTRAVENOUS | Status: DC | PRN
  Administered 2021-10-07: 10 mL

## 2021-10-07 MED ORDER — SODIUM CHLORIDE 0.9 % IV SOLN
150.0000 mg | Freq: Once | INTRAVENOUS | Status: AC
  Administered 2021-10-07: 150 mg via INTRAVENOUS
  Filled 2021-10-07: qty 150

## 2021-10-07 MED ORDER — SODIUM CHLORIDE 0.9 % IV SOLN
10.0000 mg | Freq: Once | INTRAVENOUS | Status: AC
  Administered 2021-10-07: 10 mg via INTRAVENOUS
  Filled 2021-10-07: qty 10

## 2021-10-07 MED ORDER — ACETAMINOPHEN 325 MG PO TABS
650.0000 mg | ORAL_TABLET | Freq: Once | ORAL | Status: AC
  Administered 2021-10-07: 650 mg via ORAL
  Filled 2021-10-07: qty 2

## 2021-10-07 NOTE — Research (Signed)
[id number] - TREATMENT OF REFRACTORY NAUSEA ? ?Patient presented to clinic today unaccompanied for her cycle 1 day 1 appointments. ? ?LABS: Research labs collected per protocol. Patient tolerated well without complaint.  ? ?PROs: Met with patient between her port flush and MD appointments. Reviewed her concomitant medications and updated her medication list accordingly. Confirmed that patient received reminders to fill out her baseline PROs online and had completed them successfully. Provided patient with the 4 day home record and reviewed instructions for completion. Also provided her with back-up paper PROs in case she had problems with the online forms for cycle 1. Patient verbalized understanding and denied further questions at this time. ? ?PLAN: Patient agreed to C1D4 call on Monday, 4/10, as day 4 falls over the weekend. Patient to return home record at toxicity check on 4/12; research will determine eligibility for proceeding to cycle 2. Patient thanked for her time and continued voluntary participation in this study. Patient has been provided direct contact information and is encouraged to contact this nurse for any questions or concerns.  ? ?Vickii Penna, RN, BSN, CPN ?Clinical Research Nurse I ?[phone] ? ?10/07/2021 9:10 AM ? ?

## 2021-10-07 NOTE — Research (Signed)
DCP-001: Use of a Clinical Trial Screening Tool to Address Cancer Health Disparities in the [facility]) ?Patient Katherine Donovan was identified by Dr Burr Medico as a potential candidate for the above listed study. This Clinical Research Nurse met with Katherine Donovan, [id number], on 10/07/21 in a manner and location that ensures patient privacy to discuss participation in the above listed research study. Patient is Unaccompanied. A copy of the informed consent document and separate HIPAA Authorization was provided to the patient. Patient reads, speaks, and understands [place].   ?Patient was provided with the business card of this Nurse and encouraged to contact the research team with any questions. Approximately 10 minutes were spent with the patient reviewing the informed consent documents. Patient was provided the option of taking informed consent documents home to review and was encouraged to review at their convenience with their support network, including other care providers. Patient took the documents with her to review. ?Research to follow-up with patient later today during her C1D1 infusion, or at her injection appointment on Friday, 4/7. ?Vickii Penna, RN, BSN, CPN ?Clinical Research Nurse I ?[phone] ?10/07/2021 9:12 AM ? ? ?

## 2021-10-07 NOTE — Progress Notes (Addendum)
?[place]   ?[phone] [phone]   ?Clinic Follow up Note  ? ?Patient Care Team: ?Nickola Major, MD as PCP - General (Family Medicine) ?Mauro Kaufmann, RN as Oncology Nurse Navigator ?Rockwell Germany, RN as Oncology Nurse Navigator ?[name], MD as Consulting Physician (General Surgery) ?[name], MD as Consulting Physician (Hematology) ?Kyung Rudd, MD as Consulting Physician (Radiation Oncology) ? ?Date of Service:  10/07/2021 ? ?CHIEF COMPLAINT: f/u of left breast cancer ? ?CURRENT THERAPY:  ?Neoadjuvant TCHP, q21d, starting 10/07/21 ? ?ASSESSMENT & PLAN:  ?Katherine Donovan is a 76 y.o. post-menopausal female with  ? ?1. Malignant neoplasm of upper-outer quadrant of left breast, ILC, Stage IA, c(T1c, N0), ER+/PR-/HER2+, Grade 3  ?-found on screening mammogram. B/l MM and [place] on 09/01/21 showed a 2.9 cm left breast mass. Biopsy on 09/14/21 showed invasive lobular carcinoma, grade 2-3. ?-breast MRI on 09/21/21 showed 3 cm UOQ left breast malignancy, negative for additional malignancy or adenopathy. ?-baseline echo on 09/29/21 showed EF of 60-65%, normal. ?-she underwent port placement yesterday, 10/06/21, in anticipation of beginning neoadjuvant chemo, consisting of TCHP (docetaxel, carboplatin, trastuzumab and Perjeta), today.  ?-I again reviewed the potential side effect from chemotherapy, and the management.  She voiced good understanding and agrees to proceed today.   ?-We will monitor echo every 3 months, she does not want to see her cardiologist unless her echo is abnormal. ?Labs reviewed, adequate to proceed. ?-she is interested in meeting with dietician; I am happy to refer her. ?  ?2. Osteoporosis ?-Her most recent DEXA was 05/14/20 showing T-score of -3.6.  ?-she reports being on Fosamax consistently for the past 10 years. ?  ?  ?PLAN:  ?-proceed with cycle 1 TCHP today with G-CSF on day 3 ?-referral to nutrition ?-lab, flush, and f/u in 1 week for toxicity  check ?-lab, flush, f/u, and TCHP every 3 weeks ? ? ?No problem-specific Assessment & Plan notes found for this encounter. ? ? ?SUMMARY OF ONCOLOGIC HISTORY: ?Oncology History Overview Note  ? Cancer Staging  ?Malignant neoplasm of upper-outer quadrant of left breast in female, estrogen receptor positive ([place]) ?Staging form: Breast, AJCC 8th Edition ?- Clinical stage from 09/14/2021: Stage IA (cT1c, cN0, cM0, G3, ER+, PR-, HER2+) - Signed by [name], MD on 09/22/2021 ? ?  ?Malignant neoplasm of upper-outer quadrant of left breast in female, estrogen receptor positive ([place])  ?09/01/2021 Mammogram  ? CLINICAL DATA:  Bilateral screening recall for a right breast ?asymmetry and left breast mass. ?  ?EXAM: ?DIGITAL DIAGNOSTIC BILATERAL MAMMOGRAM WITH TOMOSYNTHESIS AND CAD; ?ULTRASOUND LEFT BREAST LIMITED; ULTRASOUND RIGHT BREAST LIMITED ? ?IMPRESSION: ?1. There is a highly suspicious ill-defined mass in the upper-outer ?left breast. The mass measures at least 2.9 cm, and possibly up to ?3.8 cm. Accurate measurement is difficult due to its ill-defined ?insinuating appearance. ?  ?2.  No evidence of left axillary lymphadenopathy. ?  ?3. No persistent suspicious mammographic or targeted sonographic abnormalities in the right breast. ?  ?09/14/2021 Cancer Staging  ? Staging form: Breast, AJCC 8th Edition ?- Clinical stage from 09/14/2021: Stage IA (cT1c, cN0, cM0, G3, ER+, PR-, HER2+) - Signed by [name], MD on 09/22/2021 ?Stage prefix: Initial diagnosis ?Histologic grading system: 3 grade system ? ?  ?09/14/2021 Initial Biopsy  ? Diagnosis ?Breast, left, needle core biopsy, 1 o'clock, 4cmfn ?- INVASIVE MAMMARY CARCINOMA. SEE NOTE ?Diagnosis Note ?Carcinoma measures 1.3 cm in greatest linear dimension and appears grade 2-3. ? ?  Immunohistochemical stain for E-cadherin is negative, consistent with a lobular phenotype. ? ?PROGNOSTIC INDICATORS ?Results: ?The tumor cells are POSITIVE for Her2 (3+). ?Estrogen Receptor: 50%,  POSITIVE, MODERATE-WEAK STAINING INTENSITY ?Progesterone Receptor: 0%, NEGATIVE ?Proliferation Marker Ki67: 15% ?  ?09/18/2021 Initial Diagnosis  ? Malignant neoplasm of upper-outer quadrant of left breast in female, estrogen receptor positive ([place]) ?  ?09/21/2021 Imaging  ? EXAM: ?BILATERAL BREAST MRI WITH AND WITHOUT CONTRAST ? ?IMPRESSION: ?1. 3 cm biopsy-proven malignancy within the UPPER-OUTER LEFT breast. ?No evidence of multifocal, multicentric or contralateral malignancy. ?No abnormal lymph nodes. ?  ?10/07/2021 -  Chemotherapy  ? Patient is on Treatment Plan : BREAST  Docetaxel + Carboplatin + Trastuzumab + Pertuzumab  (TCHP) q21d   ?   ? ? ? ?INTERVAL HISTORY:  ?Katherine Donovan is here for a follow up of breast cancer. She was last seen by me on 09/23/21 in consultation. She presents to the clinic alone. ?She reports she is doing well and ready to start chemo. ?  ?All other systems were reviewed with the patient and are negative. ? ?MEDICAL HISTORY:  ?Past Medical History:  ?Diagnosis Date  ? History of bunionectomy 09/02/2020  ? Hyperlipidemia   ? Hyperlipidemia   ? left breast ILC 09/15/21  ? Osteoporosis   ? Rosacea   ? to face  ? ? ?SURGICAL HISTORY: ?Past Surgical History:  ?Procedure Laterality Date  ? SIGMOIDOSCOPY  1999  ? TUBAL LIGATION    ? ? ?I have reviewed the social history and family history with the patient and they are unchanged from previous note. ? ?ALLERGIES:  has No Known Allergies. ? ?MEDICATIONS:  ?Current Outpatient Medications  ?Medication Sig Dispense Refill  ? alendronate (FOSAMAX) 70 MG tablet Take 70 mg by mouth once a week. Take with a full glass of water on an empty stomach.    ? Calcium Carbonate-Vitamin D 600-400 MG-UNIT tablet Take 1 tablet by mouth daily.      ? CRESTOR 10 MG tablet Take 5 mg by mouth Daily.    ? dexamethasone (DECADRON) 4 MG tablet Take 1 tablet (4 mg total) by mouth daily. Take 1 tab twice daily the day before Taxotere. Then take 1 tab daily x 3 days after  chemotherapy. 30 tablet 0  ? Glucosamine 750 MG TABS Take 750 mg by mouth daily.      ? hydrocortisone 2.5 % cream Apply 1 application. topically as needed.    ? Lactobacillus Rhamnosus, GG, (RA PROBIOTIC DIGESTIVE CARE) CAPS Take 1 capsule by mouth 3 (three) times a week.    ? lidocaine-prilocaine (EMLA) cream Apply to affected area once 30 g 3  ? metroNIDAZOLE (METROGEL) 0.75 % gel Apply 1 application. topically 2 (two) times daily.    ? Misc Natural Products (JOINT SUPPORT COMPLEX PO) Take by mouth daily. [place] Joints-Cartilage-Bone Triple Action Joint Health; Undenatured Type II Collagen    ? Multiple Vitamin (MULTI-VITAMIN) tablet Take 1 tablet by mouth daily.    ? ondansetron (ZOFRAN) 8 MG tablet Take 1 tablet (8 mg total) by mouth 2 (two) times daily as needed (Nausea or vomiting). Start on the third day after chemotherapy. 30 tablet 1  ? prochlorperazine (COMPAZINE) 10 MG tablet Take 1 tablet (10 mg total) by mouth every 6 (six) hours as needed (Nausea or vomiting). 30 tablet 1  ? ?No current facility-administered medications for this visit.  ? ?Facility-Administered Medications Ordered in Other Visits  ?Medication Dose Route Frequency Provider Last Rate Last Admin  ?  acetaminophen (TYLENOL) tablet 650 mg  650 mg Oral Once [name], MD      ? CARBOplatin (PARAPLATIN) 350 mg in sodium chloride 0.9 % 100 mL chemo infusion  350 mg Intravenous Once [name], MD      ? dexamethasone (DECADRON) 10 mg in sodium chloride 0.9 % 50 mL IVPB  10 mg Intravenous Once [name], MD      ? diphenhydrAMINE (BENADRYL) capsule 50 mg  50 mg Oral Once [name], MD      ? DOCEtaxel (TAXOTERE) 120 mg in sodium chloride 0.9 % 250 mL chemo infusion  75 mg/m2 (Treatment Plan Recorded) Intravenous Once [name], MD      ? fosaprepitant (EMEND) 150 mg in sodium chloride 0.9 % 145 mL IVPB  150 mg Intravenous Once [name], MD      ? heparin lock flush 100 unit/mL  500 Units Intracatheter Once PRN [name], MD      ?  palonosetron Leona Carry) injection 0.25 mg  0.25 mg Intravenous Once [name], MD      ? pertuzumab (PERJETA) 840 mg in sodium chloride 0.9 % 250 mL chemo infusion  840 mg Intravenous Once [name], MD      ? sodium chlo

## 2021-10-07 NOTE — Patient Instructions (Signed)
[place]  Discharge Instructions: ?Thank you for choosing [facility] to provide your oncology and hematology care.  ? ?If you have a lab appointment with the [facility], please go directly to the [facility] and check in at the registration area. ?  ?Wear comfortable clothing and clothing appropriate for easy access to any Portacath or PICC line.  ? ?We strive to give you quality time with your provider. You may need to reschedule your appointment if you arrive late (15 or more minutes).  Arriving late affects you and other patients whose appointments are after yours.  Also, if you miss three or more appointments without notifying the office, you may be dismissed from the clinic at the provider?s discretion.    ?  ?For prescription refill requests, have your pharmacy contact our office and allow 72 hours for refills to be completed.   ? ?Today you received the following chemotherapy and/or immunotherapy agents trastuzumab, pertuzumab, docetaxel, carboplatin    ?  ?To help prevent nausea and vomiting after your treatment, we encourage you to take your nausea medication as directed. ? ?BELOW ARE SYMPTOMS THAT SHOULD BE REPORTED IMMEDIATELY: ?*FEVER GREATER THAN 100.4 F (38 ?C) OR HIGHER ?*CHILLS OR SWEATING ?*NAUSEA AND VOMITING THAT IS NOT CONTROLLED WITH YOUR NAUSEA MEDICATION ?*UNUSUAL SHORTNESS OF BREATH ?*UNUSUAL BRUISING OR BLEEDING ?*URINARY PROBLEMS (pain or burning when urinating, or frequent urination) ?*BOWEL PROBLEMS (unusual diarrhea, constipation, pain near the anus) ?TENDERNESS IN MOUTH AND THROAT WITH OR WITHOUT PRESENCE OF ULCERS (sore throat, sores in mouth, or a toothache) ?UNUSUAL RASH, SWELLING OR PAIN  ?UNUSUAL VAGINAL DISCHARGE OR ITCHING  ? ?Items with * indicate a potential emergency and should be followed up as soon as possible or go to the Emergency Department if any problems should occur. ? ?Please show the CHEMOTHERAPY ALERT CARD or  IMMUNOTHERAPY ALERT CARD at check-in to the Emergency Department and triage nurse. ? ?Should you have questions after your visit or need to cancel or reschedule your appointment, please contact [facility]  Dept: [phone]  and follow the prompts.  Office hours are 8:00 a.m. to 4:30 p.m. Monday - Friday. Please note that voicemails left after 4:00 p.m. may not be returned until the following business day.  We are closed weekends and major holidays. You have access to a nurse at all times for urgent questions. Please call the main number to the clinic Dept: [phone] and follow the prompts. ? ? ?For any non-urgent questions, you may also contact your provider using MyChart. We now offer e-Visits for anyone 89 and older to request care online for non-urgent symptoms. For details visit [email or web address]. ?  ?Also download the MyChart app! Go to the app store, search "MyChart", open the app, select [facility], and log in with your MyChart username and password. ? ?Due to Covid, a mask is required upon entering the hospital/clinic. If you do not have a mask, one will be given to you upon arrival. For doctor visits, patients may have 1 support person aged 71 or older with them. For treatment visits, patients cannot have anyone with them due to current Covid guidelines and our immunocompromised population.  ? ?Trastuzumab injection for infusion ?What is this medication? ?TRASTUZUMAB (tras TOO zoo mab) is a monoclonal antibody. It is used to treat breast cancer and stomach cancer. ?This medicine may be used for other purposes; ask your health care provider or pharmacist if you have questions. ?  COMMON BRAND NAME(S): Herceptin, Belenda Cruise, Ogivri, Ontruzant, Trazimera ?What should I tell my care team before I take this medication? ?They need to know if you have any of these conditions: ?heart disease ?heart failure ?lung or breathing disease, like asthma ?an unusual or  allergic reaction to trastuzumab, benzyl alcohol, or other medications, foods, dyes, or preservatives ?pregnant or trying to get pregnant ?breast-feeding ?How should I use this medication? ?This drug is given as an infusion into a vein. It is administered in a hospital or clinic by a specially trained health care professional. ?Talk to your pediatrician regarding the use of this medicine in children. This medicine is not approved for use in children. ?Overdosage: If you think you have taken too much of this medicine contact a poison control center or emergency room at once. ?NOTE: This medicine is only for you. Do not share this medicine with others. ?What if I miss a dose? ?It is important not to miss a dose. Call your doctor or health care professional if you are unable to keep an appointment. ?What may interact with this medication? ?This medicine may interact with the following medications: ?certain types of chemotherapy, such as daunorubicin, doxorubicin, epirubicin, and idarubicin ?This list may not describe all possible interactions. Give your health care provider a list of all the medicines, herbs, non-prescription drugs, or dietary supplements you use. Also tell them if you smoke, drink alcohol, or use illegal drugs. Some items may interact with your medicine. ?What should I watch for while using this medication? ?Visit your doctor for checks on your progress. Report any side effects. Continue your course of treatment even though you feel ill unless your doctor tells you to stop. ?Call your doctor or health care professional for advice if you get a fever, chills or sore throat, or other symptoms of a cold or flu. Do not treat yourself. Try to avoid being around people who are sick. ?You may experience fever, chills and shaking during your first infusion. These effects are usually mild and can be treated with other medicines. Report any side effects during the infusion to your health care professional. Fever  and chills usually do not happen with later infusions. ?Do not become pregnant while taking this medicine or for 7 months after stopping it. Women should inform their doctor if they wish to become pregnant or think they might be pregnant. Women of child-bearing potential will need to have a negative pregnancy test before starting this medicine. There is a potential for serious side effects to an unborn child. Talk to your health care professional or pharmacist for more information. Do not breast-feed an infant while taking this medicine or for 7 months after stopping it. ?Women must use effective birth control with this medicine. ?What side effects may I notice from receiving this medication? ?Side effects that you should report to your doctor or health care professional as soon as possible: ?allergic reactions like skin rash, itching or hives, swelling of the face, lips, or tongue ?chest pain or palpitations ?cough ?dizziness ?feeling faint or lightheaded, falls ?fever ?general ill feeling or flu-like symptoms ?signs of worsening heart failure like breathing problems; swelling in your legs and feet ?unusually weak or tired ?Side effects that usually do not require medical attention (report to your doctor or health care professional if they continue or are bothersome): ?bone pain ?changes in taste ?diarrhea ?joint pain ?nausea/vomiting ?weight loss ?This list may not describe all possible side effects. Call your doctor for medical advice  about side effects. You may report side effects to FDA at 1-800-FDA-1088. ?Where should I keep my medication? ?This drug is given in a hospital or clinic and will not be stored at home. ?NOTE: This sheet is a summary. It may not cover all possible information. If you have questions about this medicine, talk to your doctor, pharmacist, or health care provider. ?? 2022 Elsevier/Gold Standard (2016-07-06 00:00:00) ? ?Pertuzumab injection ?What is this medication? ?PERTUZUMAB (per TOOZ  ue mab) is a monoclonal antibody. It is used to treat breast cancer. ?This medicine may be used for other purposes; ask your health care provider or pharmacist if you have questions. ?COMMON BRAND NAME(S): PER

## 2021-10-07 NOTE — Progress Notes (Signed)
Husband picked up the phone in the middle of this RN leaving a message ? ?

## 2021-10-07 NOTE — Progress Notes (Signed)
Left message stating courtesy call and if any questions or concerns please call the doctors office.  

## 2021-10-08 ENCOUNTER — Telehealth: Payer: Self-pay | Admitting: Hematology

## 2021-10-08 NOTE — Telephone Encounter (Signed)
.  Called patient to schedule appointment per 4/6 inbasket, patient is aware of date and time.   ?

## 2021-10-09 ENCOUNTER — Inpatient Hospital Stay: Payer: Medicare Other

## 2021-10-09 VITALS — BP 168/82 | HR 63 | Temp 98.0°F | Resp 18

## 2021-10-09 DIAGNOSIS — Z170 Estrogen receptor positive status [ER+]: Secondary | ICD-10-CM

## 2021-10-09 DIAGNOSIS — Z5112 Encounter for antineoplastic immunotherapy: Secondary | ICD-10-CM | POA: Diagnosis not present

## 2021-10-09 MED ORDER — PEGFILGRASTIM-CBQV 6 MG/0.6ML ~~LOC~~ SOSY
6.0000 mg | PREFILLED_SYRINGE | Freq: Once | SUBCUTANEOUS | Status: AC
  Administered 2021-10-09: 6 mg via SUBCUTANEOUS
  Filled 2021-10-09: qty 0.6

## 2021-10-09 NOTE — Research (Signed)
DCP-001: Use of a Clinical Trial Screening Tool to Address Cancer Health Disparities in the [facility]) ? ?Patient Katherine Donovan was identified by Dr Burr Medico as a potential candidate for the above listed study.  This Clinical Research Nurse met with Katherine Donovan, [id number] on 10/09/21 in a manner and location that ensures patient privacy to discuss participation in the above listed research study.  Patient is Unaccompanied.  Patient was previously provided with informed consent documents.  Patient confirmed they have read the informed consent documents. ? ?As outlined in the informed consent form, this Nurse and Katherine Donovan discussed the purpose of the research study, the investigational nature of the study, study procedures and requirements for study participation, potential risks and benefits of study participation, as well as alternatives to participation.  This study is not blinded or double-blinded. The patient understands participation is voluntary and they may withdraw from study participation at any time.  This study does not involve randomization.  This study does not involve an investigational drug or device. This study does not involve a placebo. Patient understands enrollment is pending full eligibility review.  ? ?Confidentiality and how the patient's information will be used as part of study participation were discussed.  Patient was informed there is not reimbursement provided for their time and effort spent on trial participation.  The patient is encouraged to discuss research study participation with their insurance provider to determine what costs they may incur as part of study participation, including research related injury.   ? ?All questions were answered to patient's satisfaction.  The informed consent and separate HIPAA Authorization was reviewed page by page.  The patient's mental and emotional status is appropriate to provide informed  consent, and the patient verbalizes an understanding of study participation.  Patient has agreed to participate in the above listed research study and has voluntarily signed the informed consent ([place] active date 07/30/21) and separate HIPPA authorization on 10/09/21 at 1236PM.  The patient was provided with a copy of the signed informed consent form and separate HIPAA Authorization for their reference.  No study specific procedures were obtained prior to the signing of the informed consent document.  Approximately 15 minutes were spent with the patient reviewing the informed consent documents.  Patient was not requested to complete a Release of Information form. ? ?Vickii Penna, RN, BSN, CPN ?Clinical Research Nurse I ?[phone] ? ?10/09/2021 1:07 PM ? ? ?

## 2021-10-12 DIAGNOSIS — Z170 Estrogen receptor positive status [ER+]: Secondary | ICD-10-CM

## 2021-10-12 NOTE — Research (Signed)
[id number] - TREATMENT OF REFRACTORY NAUSEA ? ?Called and connected with patient for cycle 1 day 4 call (day 4 was Saturday, a non-work day). ? ?PROs: Patient had successfully completed online questionnaires. Patient reminded to bring paper copy of the 4 day home record to her toxicity check appointment on Wednesday, 4/12, for research to collect. ? ?ADVERSE EVENTS: Patient reported mild insomnia, constipation, and change in taste. Per protocol, only adverse events related to the study questionnaires or study blood draw are reportable for cycle 1. ? ?4 DAY HOME RECORD: Patient reviewed her record and stated that she did not report a score of 3 or greater. ? ?PLAN: Patient informed that she was not eligible to randomize to cycle 2 since her nausea was well-controlled. Patient thanked for her time and continued voluntary participation in this study. Patient has been provided direct contact information and is encouraged to contact this nurse for any questions or concerns.  ? ?Vickii Penna, RN, BSN, CPN ?Clinical Research Nurse I ?[phone] ? ?10/12/2021 11:57 AM ? ?

## 2021-10-14 ENCOUNTER — Inpatient Hospital Stay: Payer: Medicare Other | Admitting: Dietician

## 2021-10-14 ENCOUNTER — Encounter: Payer: Self-pay | Admitting: Hematology

## 2021-10-14 ENCOUNTER — Other Ambulatory Visit: Payer: Self-pay

## 2021-10-14 ENCOUNTER — Encounter: Payer: Self-pay | Admitting: *Deleted

## 2021-10-14 ENCOUNTER — Inpatient Hospital Stay: Payer: Medicare Other

## 2021-10-14 ENCOUNTER — Inpatient Hospital Stay (HOSPITAL_BASED_OUTPATIENT_CLINIC_OR_DEPARTMENT_OTHER): Payer: Medicare Other | Admitting: Hematology

## 2021-10-14 VITALS — BP 163/83 | HR 70 | Temp 97.9°F | Resp 18 | Ht 62.0 in | Wt 122.0 lb

## 2021-10-14 DIAGNOSIS — Z170 Estrogen receptor positive status [ER+]: Secondary | ICD-10-CM

## 2021-10-14 DIAGNOSIS — C50412 Malignant neoplasm of upper-outer quadrant of left female breast: Secondary | ICD-10-CM

## 2021-10-14 DIAGNOSIS — Z5112 Encounter for antineoplastic immunotherapy: Secondary | ICD-10-CM | POA: Diagnosis not present

## 2021-10-14 DIAGNOSIS — Z95828 Presence of other vascular implants and grafts: Secondary | ICD-10-CM | POA: Insufficient documentation

## 2021-10-14 LAB — CBC WITH DIFFERENTIAL (CANCER CENTER ONLY)
Abs Immature Granulocytes: 0.11 10*3/uL — ABNORMAL HIGH (ref 0.00–0.07)
Basophils Absolute: 0.1 10*3/uL (ref 0.0–0.1)
Basophils Relative: 2 %
Eosinophils Absolute: 0 10*3/uL (ref 0.0–0.5)
Eosinophils Relative: 1 %
HCT: 39.6 % (ref 36.0–46.0)
Hemoglobin: 13.6 g/dL (ref 12.0–15.0)
Immature Granulocytes: 2 %
Lymphocytes Relative: 13 %
Lymphs Abs: 0.7 10*3/uL (ref 0.7–4.0)
MCH: 30.5 pg (ref 26.0–34.0)
MCHC: 34.3 g/dL (ref 30.0–36.0)
MCV: 88.8 fL (ref 80.0–100.0)
Monocytes Absolute: 1.6 10*3/uL — ABNORMAL HIGH (ref 0.1–1.0)
Monocytes Relative: 28 %
Neutro Abs: 3.3 10*3/uL (ref 1.7–7.7)
Neutrophils Relative %: 54 %
Platelet Count: 152 10*3/uL (ref 150–400)
RBC: 4.46 MIL/uL (ref 3.87–5.11)
RDW: 11.8 % (ref 11.5–15.5)
Smear Review: NORMAL
WBC Count: 5.8 10*3/uL (ref 4.0–10.5)
nRBC: 0 % (ref 0.0–0.2)

## 2021-10-14 LAB — CMP (CANCER CENTER ONLY)
ALT: 22 U/L (ref 0–44)
AST: 21 U/L (ref 15–41)
Albumin: 3.9 g/dL (ref 3.5–5.0)
Alkaline Phosphatase: 47 U/L (ref 38–126)
Anion gap: 6 (ref 5–15)
BUN: 19 mg/dL (ref 8–23)
CO2: 28 mmol/L (ref 22–32)
Calcium: 10 mg/dL (ref 8.9–10.3)
Chloride: 103 mmol/L (ref 98–111)
Creatinine: 0.59 mg/dL (ref 0.44–1.00)
GFR, Estimated: >60 mL/min (ref >60)
Glucose, Bld: 112 mg/dL — ABNORMAL HIGH (ref 70–99)
Potassium: 4.2 mmol/L (ref 3.5–5.1)
Sodium: 137 mmol/L (ref 135–145)
Total Bilirubin: 0.8 mg/dL (ref 0.3–1.2)
Total Protein: 6.4 g/dL — ABNORMAL LOW (ref 6.5–8.1)

## 2021-10-14 MED ORDER — HEPARIN SOD (PORK) LOCK FLUSH 100 UNIT/ML IV SOLN
500.0000 [IU] | Freq: Once | INTRAVENOUS | Status: AC
  Administered 2021-10-14: 500 [IU]

## 2021-10-14 MED ORDER — DIPHENOXYLATE-ATROPINE 2.5-0.025 MG PO TABS: 1.0000 | ORAL_TABLET | Freq: Four times a day (QID) | ORAL | Status: DC | PRN

## 2021-10-14 MED ORDER — DIPHENOXYLATE-ATROPINE 2.5-0.025 MG PO TABS: 2.0000 | ORAL_TABLET | Freq: Four times a day (QID) | ORAL | 0 refills | Status: DC | PRN

## 2021-10-14 MED ORDER — SODIUM CHLORIDE 0.9% FLUSH
10.0000 mL | Freq: Once | INTRAVENOUS | Status: AC
  Administered 2021-10-14: 10 mL

## 2021-10-14 NOTE — Progress Notes (Signed)
?Katherine Donovan   ?[phone] [phone]   ?Clinic Follow up Note  ? ?Patient Care Team: ?Nickola Major, MD as PCP - General (Family Medicine) ?Mauro Kaufmann, RN as Oncology Nurse Navigator ?Rockwell Germany, RN as Oncology Nurse Navigator ?[name], MD as Consulting Physician (General Surgery) ?[name], MD as Consulting Physician (Hematology) ?Kyung Rudd, MD as Consulting Physician (Radiation Oncology) ? ?Date of Service:  10/14/2021 ? ?CHIEF COMPLAINT: f/u of left breast cancer ? ?CURRENT THERAPY:  ?Neoadjuvant TCHP (docetaxel, carboplatin, trastuzumab and Perjeta), q21d, starting 10/07/21 ? ?ASSESSMENT & PLAN:  ?Katherine Donovan is a 76 y.o. post-menopausal female with  ? ?1. Malignant neoplasm of upper-outer quadrant of left breast, ILC, Stage IA, c(T1c, N0), ER+/PR-/HER2+, Grade 3  ?-found on screening mammogram. B/l MM and [place] on 09/01/21 showed a 2.9 cm left breast mass. Biopsy on 09/14/21 showed invasive lobular carcinoma, grade 2-3. ?-breast MRI on 09/21/21 showed 3 cm UOQ left breast malignancy, negative for additional malignancy or adenopathy. ?-baseline echo on 09/29/21 showed EF of 60-65%, normal. We will monitor every 3 months. ?-she began neoadjuvant TCHP on 10/07/21. She tolerated moderately well with taste change and constipation/diarrhea. Labs reviewed, overall WNL. She will return for cycle 2 in 2 weeks. ? ?2. Symptom Management: Constipation/Diarrhea, Taste Change with Appetite loss ?-secondary to chemo ?-she had constipation that turned into diarrhea last night (day 7). She took imodium with relief, but I will also call in lomotil for her to have. I advised her to keep up her water intake or use electrolyte replacement.  ?-she has lost 8 lbs in the last week, secondary to taste changes and recent diarrhea. I also advised her to increase her calorie intake; she adds she just bought some Ensure. ?  ?3. Osteoporosis ?-Her most recent DEXA was 05/14/20  showing T-score of -3.6.  ?-she reports being on Fosamax consistently for the past 10 years. ?  ?  ?PLAN:  ?-I called in lomotil ?-she will call me if diarrhea gets worse and she may need IVF  ?-lab, flush, f/u, and TCHP in 2 weeks ? ? ?No problem-specific Assessment & Plan notes found for this encounter. ? ? ?SUMMARY OF ONCOLOGIC HISTORY: ?Oncology History Overview Note  ? Cancer Staging  ?Malignant neoplasm of upper-outer quadrant of left breast in female, estrogen receptor positive ([place]) ?Staging form: Breast, AJCC 8th Edition ?- Clinical stage from 09/14/2021: Stage IA (cT1c, cN0, cM0, G3, ER+, PR-, HER2+) - Signed by [name], MD on 09/22/2021 ? ?  ?Malignant neoplasm of upper-outer quadrant of left breast in female, estrogen receptor positive ([place])  ?09/01/2021 Mammogram  ? CLINICAL DATA:  Bilateral screening recall for a right breast ?asymmetry and left breast mass. ?  ?EXAM: ?DIGITAL DIAGNOSTIC BILATERAL MAMMOGRAM WITH TOMOSYNTHESIS AND CAD; ?ULTRASOUND LEFT BREAST LIMITED; ULTRASOUND RIGHT BREAST LIMITED ? ?IMPRESSION: ?1. There is a highly suspicious ill-defined mass in the upper-outer ?left breast. The mass measures at least 2.9 cm, and possibly up to ?3.8 cm. Accurate measurement is difficult due to its ill-defined ?insinuating appearance. ?  ?2.  No evidence of left axillary lymphadenopathy. ?  ?3. No persistent suspicious mammographic or targeted sonographic abnormalities in the right breast. ?  ?09/14/2021 Cancer Staging  ? Staging form: Breast, AJCC 8th Edition ?- Clinical stage from 09/14/2021: Stage IA (cT1c, cN0, cM0, G3, ER+, PR-, HER2+) - Signed by [name], MD on 09/22/2021 ?Stage prefix: Initial diagnosis ?Histologic grading system: 3 grade system ? ?  ?  09/14/2021 Initial Biopsy  ? Diagnosis ?Breast, left, needle core biopsy, 1 o'clock, 4cmfn ?- INVASIVE MAMMARY CARCINOMA. SEE NOTE ?Diagnosis Note ?Carcinoma measures 1.3 cm in greatest linear dimension and appears grade 2-3. ? ?Immunohistochemical  stain for E-cadherin is negative, consistent with a lobular phenotype. ? ?PROGNOSTIC INDICATORS ?Results: ?The tumor cells are POSITIVE for Her2 (3+). ?Estrogen Receptor: 50%, POSITIVE, MODERATE-WEAK STAINING INTENSITY ?Progesterone Receptor: 0%, NEGATIVE ?Proliferation Marker Ki67: 15% ?  ?09/18/2021 Initial Diagnosis  ? Malignant neoplasm of upper-outer quadrant of left breast in female, estrogen receptor positive ([place]) ?  ?09/21/2021 Imaging  ? EXAM: ?BILATERAL BREAST MRI WITH AND WITHOUT CONTRAST ? ?IMPRESSION: ?1. 3 cm biopsy-proven malignancy within the UPPER-OUTER LEFT breast. ?No evidence of multifocal, multicentric or contralateral malignancy. ?No abnormal lymph nodes. ?  ?10/07/2021 -  Chemotherapy  ? Patient is on Treatment Plan : BREAST  Docetaxel + Carboplatin + Trastuzumab + Pertuzumab  (TCHP) q21d   ?   ? ? ? ?INTERVAL HISTORY:  ?Katherine Donovan is here for a follow up of breast cancer. She was last seen by me on 10/07/21. She presents to the clinic alone. ?She reports her taste has changed causing her appetite to drop. She also reports she had constipation that switched to diarrhea last night. She adds she had 4 bowel movements last night. She endorses taking imodium AD early this morning. She notes she has not had a bowel movement since. She denies nausea or vomiting.  ?  ?All other systems were reviewed with the patient and are negative. ? ?MEDICAL HISTORY:  ?Past Medical History:  ?Diagnosis Date  ? History of bunionectomy 09/02/2020  ? Hyperlipidemia   ? Hyperlipidemia   ? left breast ILC 09/15/21  ? Osteoporosis   ? Rosacea   ? to face  ? ? ?SURGICAL HISTORY: ?Past Surgical History:  ?Procedure Laterality Date  ? PORTACATH PLACEMENT Right 10/06/2021  ? Procedure: INSERTION PORT-A-CATH;  Surgeon: [name], MD;  Location: [facility];  Service: General;  Laterality: Right;  ? SIGMOIDOSCOPY  1999  ? TUBAL LIGATION    ? ? ?I have reviewed the social history and family history with  the patient and they are unchanged from previous note. ? ?ALLERGIES:  has No Known Allergies. ? ?MEDICATIONS:  ?Current Outpatient Medications  ?Medication Sig Dispense Refill  ? alendronate (FOSAMAX) 70 MG tablet Take 70 mg by mouth once a week. Take with a full glass of water on an empty stomach.    ? Calcium Carbonate-Vitamin D 600-400 MG-UNIT tablet Take 1 tablet by mouth daily.      ? CRESTOR 10 MG tablet Take 5 mg by mouth Daily.    ? dexamethasone (DECADRON) 4 MG tablet Take 1 tablet (4 mg total) by mouth daily. Take 1 tab twice daily the day before Taxotere. Then take 1 tab daily x 3 days after chemotherapy. 30 tablet 0  ? Glucosamine 750 MG TABS Take 750 mg by mouth daily.      ? hydrocortisone 2.5 % cream Apply 1 application. topically as needed.    ? Lactobacillus Rhamnosus, GG, (RA PROBIOTIC DIGESTIVE CARE) CAPS Take 1 capsule by mouth 3 (three) times a week.    ? lidocaine-prilocaine (EMLA) cream Apply to affected area once 30 g 3  ? metroNIDAZOLE (METROGEL) 0.75 % gel Apply 1 application. topically 2 (two) times daily.    ? Misc Natural Products (JOINT SUPPORT COMPLEX PO) Take by mouth daily. [place] Joints-Cartilage-Bone Triple Action Joint Health; Undenatured Type  II Collagen    ? Multiple Vitamin (MULTI-VITAMIN) tablet Take 1 tablet by mouth daily.    ? ondansetron (ZOFRAN) 8 MG tablet Take 1 tablet (8 mg total) by mouth 2 (two) times daily as needed (Nausea or vomiting). Start on the third day after chemotherapy. 30 tablet 1  ? prochlorperazine (COMPAZINE) 10 MG tablet Take 1 tablet (10 mg total) by mouth every 6 (six) hours as needed (Nausea or vomiting). 30 tablet 1  ? ?No current facility-administered medications for this visit.  ? ? ?PHYSICAL EXAMINATION: ?ECOG PERFORMANCE STATUS: 1 - Symptomatic but completely ambulatory ? ?Vitals:  ? 10/14/21 1124  ?BP: (!) 163/83  ?Pulse: 70  ?Resp: 18  ?Temp: 97.9 ?F (36.6 ?C)  ?SpO2: 97%  ? ?Wt Readings from Last 3 Encounters:  ?10/14/21 122 lb (55.3 kg)   ?10/07/21 129 lb 14.4 oz (58.9 kg)  ?10/06/21 125 lb (56.7 kg)  ?  ? ?GENERAL:alert, no distress and comfortable ?SKIN: skin color normal, no rashes or significant lesions ?EYES: normal, Conjunctiva are

## 2021-10-14 NOTE — Progress Notes (Signed)
Nutrition Assessment ? ? ?Reason for Assessment: Provider referral per pt request ? ? ?ASSESSMENT: 76 year old female with breast cancer of upper-outer quadrant of left breast, estrogen receptor positive. She is receiving neoadjuvant chemotherapy with TCHP. Patient is under the care of Dr. Burr Medico. ? ?Past medical history includes HLD, osteoporosis ? ?Met with patient and husband in office. Patient reports typically eating balanced diet with lots of fruits and vegetables. Every once in a while they will have "junk food" like pizza or a donut. She and her husband enjoy preparing meals together as well as trying new foods. Patient reports 4-5 episodes of diarrhea after first chemotherapy. This has resolved. Patient reports decreased water intake the last couple of days as this has an off taste. Patient endorses metallic taste of foods. She is unable to eat more than a few bites at meal times. Husband purchased Ensure Plus this morning. She is asking if these are okay to drink.  ? ?Nutrition Focused Physical Exam:  ? ?Orbital Region: mild ?Buccal Region: mild ?Dorsal Hand: mild ?Eyes: reviewed ?Mouth: lips red/dry ?Skin: reviewed ?Nails: reviewed ? ? ?Medications: fosamax, calcium carbonate with D, decadron, crestor, MVI, zofran, compazine ? ? ?Labs: 4/5 - glucose 137 ? ? ?Anthropometrics:  ? ?Height: 5'2" ?Weight: 129 lb 14.4 oz (4/5) ?UBW: 126 lb (09/23/21) ?BMI: 23.76 ? ? ?NUTRITION DIAGNOSIS: Inadequate oral intake related to side effects of chemotherapy as evidenced by reported early satiety, altered taste of foods, diarrhea ? ? ?INTERVENTION:  ?Educated on importance of adequate calories and protein to maintain strength/weights ?Encouraged small frequent meals and snacks - handout with ideas provided ?Discussed strategies for altered taste and suggested baking soda salt water rinses several times daily before meals - handout with tips plus recipe provided ?Discussed strategies for diarrhea, foods to limit/avoid and  foods best tolerated - handout provided ?Educated on importance of hydration - handout provided ?Recommend 1-2 Ensure Plus/equivalent - coupons provided ?Provided support and encouragement ?Contact information given  ? ? ? ?MONITORING, EVALUATION, GOAL: Patient will tolerate adequate calories and protein to minimize weight loss  ? ? ?Next Visit: Wednesday May 17 during infusion  ? ? ? ? ? ? ?

## 2021-10-14 NOTE — Progress Notes (Addendum)
[place] [address]: Collected the Cycle 1 Four-day Home Record from patient. Reviewed for completeness and accuracy. Thanked patient for completing it and participating in this study.  ?Foye Spurling, BSN, RN, CCRP ?Clinical Research Nurse II ?10/14/2021 10:49 AM ? ?

## 2021-10-19 ENCOUNTER — Telehealth: Payer: Self-pay | Admitting: Genetic Counselor

## 2021-10-19 ENCOUNTER — Ambulatory Visit: Payer: Self-pay | Admitting: Genetic Counselor

## 2021-10-19 ENCOUNTER — Encounter: Payer: Self-pay | Admitting: Genetic Counselor

## 2021-10-19 DIAGNOSIS — Z1379 Encounter for other screening for genetic and chromosomal anomalies: Secondary | ICD-10-CM

## 2021-10-19 DIAGNOSIS — Z23 Encounter for immunization: Secondary | ICD-10-CM | POA: Insufficient documentation

## 2021-10-19 DIAGNOSIS — Z170 Estrogen receptor positive status [ER+]: Secondary | ICD-10-CM

## 2021-10-20 ENCOUNTER — Encounter: Payer: Self-pay | Admitting: Hematology

## 2021-10-20 NOTE — Progress Notes (Addendum)
HPI:   Katherine Donovan was previously seen in the [facility] clinic due to a personal and family history of cancer and concerns regarding a hereditary predisposition to cancer. Please refer to our prior cancer genetics clinic note for more information regarding our discussion, assessment and recommendations, at the time. Katherine Donovan recent genetic test results were disclosed to her, as were recommendations warranted by these results. These results and recommendations are discussed in more detail below.  CANCER HISTORY:  Oncology History Overview Note   Cancer Staging  Malignant neoplasm of upper-outer quadrant of left breast in female, estrogen receptor positive ([place]) Staging form: Breast, AJCC 8th Edition - Clinical stage from 09/14/2021: Stage IA (cT1c, cN0, cM0, G3, ER+, PR-, HER2+) - Signed by [name], MD on 09/22/2021    Malignant neoplasm of upper-outer quadrant of left breast in female, estrogen receptor positive ([place])  09/01/2021 Mammogram   CLINICAL DATA:  Bilateral screening recall for a right breast asymmetry and left breast mass.   EXAM: DIGITAL DIAGNOSTIC BILATERAL MAMMOGRAM WITH TOMOSYNTHESIS AND CAD; ULTRASOUND LEFT BREAST LIMITED; ULTRASOUND RIGHT BREAST LIMITED  IMPRESSION: 1. There is a highly suspicious ill-defined mass in the upper-outer left breast. The mass measures at least 2.9 cm, and possibly up to 3.8 cm. Accurate measurement is difficult due to its ill-defined insinuating appearance.   2.  No evidence of left axillary lymphadenopathy.   3. No persistent suspicious mammographic or targeted sonographic abnormalities in the right breast.   09/14/2021 Cancer Staging   Staging form: Breast, AJCC 8th Edition - Clinical stage from 09/14/2021: Stage IA (cT1c, cN0, cM0, G3, ER+, PR-, HER2+) - Signed by [name], MD on 09/22/2021 Stage prefix: Initial diagnosis Histologic grading system: 3 grade system    09/14/2021 Initial Biopsy   Diagnosis Breast,  left, needle core biopsy, 1 o'clock, 4cmfn - INVASIVE MAMMARY CARCINOMA. SEE NOTE Diagnosis Note Carcinoma measures 1.3 cm in greatest linear dimension and appears grade 2-3.  Immunohistochemical stain for E-cadherin is negative, consistent with a lobular phenotype.  PROGNOSTIC INDICATORS Results: The tumor cells are POSITIVE for Her2 (3+). Estrogen Receptor: 50%, POSITIVE, MODERATE-WEAK STAINING INTENSITY Progesterone Receptor: 0%, NEGATIVE Proliferation Marker Ki67: 15%   09/18/2021 Initial Diagnosis   Malignant neoplasm of upper-outer quadrant of left breast in female, estrogen receptor positive ([place])    09/21/2021 Imaging   EXAM: BILATERAL BREAST MRI WITH AND WITHOUT CONTRAST  IMPRESSION: 1. 3 cm biopsy-proven malignancy within the UPPER-OUTER LEFT breast. No evidence of multifocal, multicentric or contralateral malignancy. No abnormal lymph nodes.   10/07/2021 -  Chemotherapy   Patient is on Treatment Plan : BREAST  Docetaxel + Carboplatin + Trastuzumab + Pertuzumab  (TCHP) q21d        10/16/2021 Genetic Testing   Negative genetic testing: no pathogenic variants detected in [facility] CancerNext-Expanded +RNAinsight Panel.  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 Report date is October 16, 2021.   The CancerNext-Expanded gene panel offered by [facility] and includes sequencing, rearrangement, and RNA analysis for the following 77 genes: AIP, ALK, APC, ATM, AXIN2, BAP1, BARD1, BLM, BMPR1A, BRCA1, BRCA2, BRIP1, CDC73, CDH1, CDK4, CDKN1B, CDKN2A, CHEK2, CTNNA1, DICER1, FANCC, FH, FLCN, GALNT12, KIF1B, LZTR1, MAX, MEN1, MET, MLH1, MSH2, MSH3, MSH6, MUTYH, NBN, NF1, NF2, NTHL1, PALB2, PHOX2B, PMS2, POT1, PRKAR1A, PTCH1, PTEN, RAD51C, RAD51D, RB1, RECQL, RET, SDHA, SDHAF2, SDHB, SDHC, SDHD, SMAD4, SMARCA4, SMARCB1, SMARCE1, STK11, SUFU, TMEM127, TP53, TSC1, TSC2, VHL and XRCC2 (sequencing and deletion/duplication); EGFR, EGLN1, HOXB13, KIT,  MITF, PDGFRA, POLD1,  and POLE (sequencing only); EPCAM and GREM1 (deletion/duplication only).     FAMILY HISTORY:  We obtained a detailed, 4-generation family history.  Significant diagnoses are listed below: Family History  Problem Relation Age of Onset   Melanoma Mother        or other skin cancer; dx after 58; no systemic therapy needed   Other Sister 66       benign breast mass   Other Sister 58       benign breast mass   Cancer Paternal Uncle        unknown type; mets; d. late 33s    Katherine Donovan reported exposure to contaminated water at [employer] in [place].  No other biological relatives were exposed to [facility] water.   Katherine Donovan is unaware of previous family history of genetic testing for hereditary cancer risks. There is no reported Ashkenazi Jewish ancestry. There is no known consanguinity.    GENETIC TEST RESULTS:  The [facility] CancerNext-Exanded +RNAinsight Panel found no pathogenic mutations. The CancerNext-Expanded gene panel offered by [facility] and includes sequencing, rearrangement, and RNA analysis for the following 77 genes: AIP, ALK, APC, ATM, AXIN2, BAP1, BARD1, BLM, BMPR1A, BRCA1, BRCA2, BRIP1, CDC73, CDH1, CDK4, CDKN1B, CDKN2A, CHEK2, CTNNA1, DICER1, FANCC, FH, FLCN, GALNT12, KIF1B, LZTR1, MAX, MEN1, MET, MLH1, MSH2, MSH3, MSH6, MUTYH, NBN, NF1, NF2, NTHL1, PALB2, PHOX2B, PMS2, POT1, PRKAR1A, PTCH1, PTEN, RAD51C, RAD51D, RB1, RECQL, RET, SDHA, SDHAF2, SDHB, SDHC, SDHD, SMAD4, SMARCA4, SMARCB1, SMARCE1, STK11, SUFU, TMEM127, TP53, TSC1, TSC2, VHL and XRCC2 (sequencing and deletion/duplication); EGFR, EGLN1, HOXB13, KIT, MITF, PDGFRA, POLD1, and POLE (sequencing only); EPCAM and GREM1 (deletion/duplication only).   The test report has been scanned into EPIC and is located under the Molecular Pathology section of the Results Review tab.  A portion of the result report is included below for reference. Genetic testing reported out on October 16, 2021.      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  At this time, it is unknown if this variant is associated with an increased risk for cancer or if it is benign, but most uncertain variants are reclassified to benign. It should not be used to make medical management decisions. With time, we suspect the laboratory will determine the significance of this variant, if any. If the laboratory reclassifies this variant, we will attempt to contact Katherine Donovan to discuss it further.   Even though a pathogenic variant was not identified, possible explanations for the cancer in the family may include: There may be no hereditary risk for cancer in the family. The cancers in Katherine Donovan and/or her family may be sporadic/familial or due to other genetic and environmental factors. There may be a gene mutation in one of these genes that current testing methods cannot detect but that chance is small. There could be another gene that has not yet been discovered, or that we have not yet tested, that is responsible for the cancer diagnoses in the family.   Therefore, it is important to remain in touch with cancer genetics in the future so that we can continue to offer Katherine Donovan the most up to date genetic testing.     ADDITIONAL GENETIC TESTING:  We discussed with Katherine Donovan that her genetic testing was fairly extensive.  If there are genes identified to increase cancer risk that can be analyzed in the future, we would be  happy to discuss and coordinate this testing at that time.    CANCER SCREENING RECOMMENDATIONS:  Katherine Donovan test result is considered negative (normal).  This means that we have not identified a hereditary cause for her personal history of cancer at this time.   An individual's cancer risk and medical management are not determined by genetic test results alone. Overall cancer risk assessment incorporates additional factors, including personal  medical history, family history, and any available genetic information that may result in a personalized plan for cancer prevention and surveillance. Therefore, it is recommended she continue to follow the cancer management and screening guidelines provided by her oncology and primary healthcare provider.  RECOMMENDATIONS FOR FAMILY MEMBERS:   Individuals in this family might be at some increased risk of developing cancer, over the general population risk, due to the family history of cancer.  Individuals in the family should notify their providers of the family history of cancer. We recommend women in this family have a yearly mammogram beginning at age 48, or 50 years younger than the earliest onset of cancer, an annual clinical breast exam, and perform monthly breast self-exams.   We do not recommend familial testing for the [id number] variant of uncertain significance (VUS).  FOLLOW-UP:  Lastly, we discussed with Katherine Donovan that cancer genetics is a rapidly advancing field and it is possible that new genetic tests will be appropriate for her and/or her family members in the future. We encouraged her to remain in contact with cancer genetics on an annual basis so we can update her personal and family histories and let her know of advances in cancer genetics that may benefit this family.   Our contact number was provided. Katherine Donovan questions were answered to her satisfaction, and she knows she is welcome to call us at anytime with additional questions or concerns.   Ijanae Macapagal M. Joette Catching, [place], [facility] Genetic Counselor [email or web address] (P) [phone]

## 2021-10-20 NOTE — Telephone Encounter (Signed)
Revealed negative genetic testing and VUS in TMEM127.  Discussed that we do not know why she has  cancer or why there is cancer in the family. It could be sporadic/famillial, due to a different gene that we are not testing, or maybe our current technology may not be able to pick something up.  It will be important for her to keep in contact with genetics to keep up with whether additional testing may be needed.   ? ? ?

## 2021-10-28 ENCOUNTER — Other Ambulatory Visit: Payer: Self-pay

## 2021-10-28 ENCOUNTER — Inpatient Hospital Stay (HOSPITAL_BASED_OUTPATIENT_CLINIC_OR_DEPARTMENT_OTHER): Payer: Medicare Other | Admitting: Hematology

## 2021-10-28 ENCOUNTER — Encounter: Payer: Self-pay | Admitting: Hematology

## 2021-10-28 ENCOUNTER — Inpatient Hospital Stay: Payer: Medicare Other

## 2021-10-28 VITALS — BP 153/82 | HR 71 | Temp 98.0°F | Resp 18 | Wt 124.5 lb

## 2021-10-28 DIAGNOSIS — C50412 Malignant neoplasm of upper-outer quadrant of left female breast: Secondary | ICD-10-CM | POA: Diagnosis not present

## 2021-10-28 DIAGNOSIS — Z170 Estrogen receptor positive status [ER+]: Secondary | ICD-10-CM

## 2021-10-28 DIAGNOSIS — Z95828 Presence of other vascular implants and grafts: Secondary | ICD-10-CM

## 2021-10-28 DIAGNOSIS — Z5112 Encounter for antineoplastic immunotherapy: Secondary | ICD-10-CM | POA: Diagnosis not present

## 2021-10-28 LAB — CMP (CANCER CENTER ONLY)
ALT: 17 U/L (ref 0–44)
AST: 20 U/L (ref 15–41)
Albumin: 3.9 g/dL (ref 3.5–5.0)
Alkaline Phosphatase: 55 U/L (ref 38–126)
Anion gap: 5 (ref 5–15)
BUN: 18 mg/dL (ref 8–23)
CO2: 27 mmol/L (ref 22–32)
Calcium: 10.1 mg/dL (ref 8.9–10.3)
Chloride: 108 mmol/L (ref 98–111)
Creatinine: 0.66 mg/dL (ref 0.44–1.00)
GFR, Estimated: >60 mL/min (ref >60)
Glucose, Bld: 99 mg/dL (ref 70–99)
Potassium: 3.9 mmol/L (ref 3.5–5.1)
Sodium: 140 mmol/L (ref 135–145)
Total Bilirubin: 0.4 mg/dL (ref 0.3–1.2)
Total Protein: 6.3 g/dL — ABNORMAL LOW (ref 6.5–8.1)

## 2021-10-28 LAB — CBC WITH DIFFERENTIAL (CANCER CENTER ONLY)
Abs Immature Granulocytes: 0.28 10*3/uL — ABNORMAL HIGH (ref 0.00–0.07)
Basophils Absolute: 0 10*3/uL (ref 0.0–0.1)
Basophils Relative: 0 %
Eosinophils Absolute: 0 10*3/uL (ref 0.0–0.5)
Eosinophils Relative: 0 %
HCT: 36.7 % (ref 36.0–46.0)
Hemoglobin: 12.2 g/dL (ref 12.0–15.0)
Immature Granulocytes: 2 %
Lymphocytes Relative: 6 %
Lymphs Abs: 1 10*3/uL (ref 0.7–4.0)
MCH: 30.3 pg (ref 26.0–34.0)
MCHC: 33.2 g/dL (ref 30.0–36.0)
MCV: 91.3 fL (ref 80.0–100.0)
Monocytes Absolute: 1.5 10*3/uL — ABNORMAL HIGH (ref 0.1–1.0)
Monocytes Relative: 9 %
Neutro Abs: 13.5 10*3/uL — ABNORMAL HIGH (ref 1.7–7.7)
Neutrophils Relative %: 83 %
Platelet Count: 528 10*3/uL — ABNORMAL HIGH (ref 150–400)
RBC: 4.02 MIL/uL (ref 3.87–5.11)
RDW: 13.4 % (ref 11.5–15.5)
WBC Count: 16.4 10*3/uL — ABNORMAL HIGH (ref 4.0–10.5)
nRBC: 0 % (ref 0.0–0.2)

## 2021-10-28 MED ORDER — ACETAMINOPHEN 325 MG PO TABS
650.0000 mg | ORAL_TABLET | Freq: Once | ORAL | Status: AC
  Administered 2021-10-28: 650 mg via ORAL
  Filled 2021-10-28: qty 2

## 2021-10-28 MED ORDER — SODIUM CHLORIDE 0.9% FLUSH: 10.0000 mL | INTRAVENOUS | Status: DC | PRN

## 2021-10-28 MED ORDER — TRASTUZUMAB-DKST CHEMO 150 MG IV SOLR
6.0000 mg/kg | Freq: Once | INTRAVENOUS | Status: AC
  Administered 2021-10-28: 336 mg via INTRAVENOUS
  Filled 2021-10-28: qty 16

## 2021-10-28 MED ORDER — SODIUM CHLORIDE 0.9 % IV SOLN
75.0000 mg/m2 | Freq: Once | INTRAVENOUS | Status: AC
  Administered 2021-10-28: 120 mg via INTRAVENOUS
  Filled 2021-10-28: qty 12

## 2021-10-28 MED ORDER — SODIUM CHLORIDE 0.9 % IV SOLN
349.0000 mg | Freq: Once | INTRAVENOUS | Status: AC
  Administered 2021-10-28: 350 mg via INTRAVENOUS
  Filled 2021-10-28: qty 35

## 2021-10-28 MED ORDER — SODIUM CHLORIDE 0.9 % IV SOLN
150.0000 mg | Freq: Once | INTRAVENOUS | Status: AC
  Administered 2021-10-28: 150 mg via INTRAVENOUS
  Filled 2021-10-28: qty 150

## 2021-10-28 MED ORDER — SODIUM CHLORIDE 0.9% FLUSH
10.0000 mL | Freq: Once | INTRAVENOUS | Status: AC
  Administered 2021-10-28: 10 mL

## 2021-10-28 MED ORDER — SODIUM CHLORIDE 0.9 % IV SOLN: Freq: Once | INTRAVENOUS | Status: AC

## 2021-10-28 MED ORDER — SODIUM CHLORIDE 0.9 % IV SOLN
420.0000 mg | Freq: Once | INTRAVENOUS | Status: AC
  Administered 2021-10-28: 420 mg via INTRAVENOUS
  Filled 2021-10-28: qty 14

## 2021-10-28 MED ORDER — HEPARIN SOD (PORK) LOCK FLUSH 100 UNIT/ML IV SOLN: 500.0000 [IU] | Freq: Once | INTRAVENOUS | Status: DC | PRN

## 2021-10-28 MED ORDER — SODIUM CHLORIDE 0.9 % IV SOLN
10.0000 mg | Freq: Once | INTRAVENOUS | Status: AC
  Administered 2021-10-28: 10 mg via INTRAVENOUS
  Filled 2021-10-28: qty 10

## 2021-10-28 MED ORDER — PALONOSETRON HCL INJECTION 0.25 MG/5ML
0.2500 mg | Freq: Once | INTRAVENOUS | Status: AC
  Administered 2021-10-28: 0.25 mg via INTRAVENOUS
  Filled 2021-10-28: qty 5

## 2021-10-28 MED ORDER — DIPHENHYDRAMINE HCL 25 MG PO CAPS
50.0000 mg | ORAL_CAPSULE | Freq: Once | ORAL | Status: AC
  Administered 2021-10-28: 50 mg via ORAL
  Filled 2021-10-28: qty 2

## 2021-10-28 NOTE — Patient Instructions (Signed)
Katherine Donovan  Discharge Instructions: ?Thank you for choosing [facility] to provide your oncology and hematology care.  ? ?If you have a lab appointment with the [facility], please go directly to the [facility] and check in at the registration area. ?  ?Wear comfortable clothing and clothing appropriate for easy access to any Portacath or PICC line.  ? ?We strive to give you quality time with your provider. You may need to reschedule your appointment if you arrive late (15 or more minutes).  Arriving late affects you and other patients whose appointments are after yours.  Also, if you miss three or more appointments without notifying the office, you may be dismissed from the clinic at the provider?s discretion.    ?  ?For prescription refill requests, have your pharmacy contact our office and allow 72 hours for refills to be completed.   ? ?Today you received the following chemotherapy and/or immunotherapy agents trastuzumab, pertuzumab, docetaxel, carboplatin    ?  ?To help prevent nausea and vomiting after your treatment, we encourage you to take your nausea medication as directed. ? ?BELOW ARE SYMPTOMS THAT SHOULD BE REPORTED IMMEDIATELY: ?*FEVER GREATER THAN 100.4 F (38 ?C) OR HIGHER ?*CHILLS OR SWEATING ?*NAUSEA AND VOMITING THAT IS NOT CONTROLLED WITH YOUR NAUSEA MEDICATION ?*UNUSUAL SHORTNESS OF BREATH ?*UNUSUAL BRUISING OR BLEEDING ?*URINARY PROBLEMS (pain or burning when urinating, or frequent urination) ?*BOWEL PROBLEMS (unusual diarrhea, constipation, pain near the anus) ?TENDERNESS IN MOUTH AND THROAT WITH OR WITHOUT PRESENCE OF ULCERS (sore throat, sores in mouth, or a toothache) ?UNUSUAL RASH, SWELLING OR PAIN  ?UNUSUAL VAGINAL DISCHARGE OR ITCHING  ? ?Items with * indicate a potential emergency and should be followed up as soon as possible or go to the Emergency Department if any problems should occur. ? ?Please show the CHEMOTHERAPY ALERT CARD or  IMMUNOTHERAPY ALERT CARD at check-in to the Emergency Department and triage nurse. ? ?Should you have questions after your visit or need to cancel or reschedule your appointment, please contact [facility]  Dept: [phone]  and follow the prompts.  Office hours are 8:00 a.m. to 4:30 p.m. Monday - Friday. Please note that voicemails left after 4:00 p.m. may not be returned until the following business day.  We are closed weekends and major holidays. You have access to a nurse at all times for urgent questions. Please call the main number to the clinic Dept: [phone] and follow the prompts. ? ? ?For any non-urgent questions, you may also contact your provider using MyChart. We now offer e-Visits for anyone 57 and older to request care online for non-urgent symptoms. For details visit [email or web address]. ?  ?Also download the MyChart app! Go to the app store, search "MyChart", open the app, select [facility], and log in with your MyChart username and password. ? ?Due to Covid, a mask is required upon entering the hospital/clinic. If you do not have a mask, one will be given to you upon arrival. For doctor visits, patients may have 1 support person aged 23 or older with them. For treatment visits, patients cannot have anyone with them due to current Covid guidelines and our immunocompromised population.  ? ?

## 2021-10-28 NOTE — Progress Notes (Signed)
?[facility]   ?[phone] [phone]   ?Clinic Follow up Note  ? ?Patient Care Team: ?Nickola Major, MD as PCP - General (Family Medicine) ?Mauro Kaufmann, RN as Oncology Nurse Navigator ?Rockwell Germany, RN as Oncology Nurse Navigator ?[name], MD as Consulting Physician (General Surgery) ?[name], MD as Consulting Physician (Hematology) ?Kyung Rudd, MD as Consulting Physician (Radiation Oncology) ? ?Date of Service:  10/28/2021 ? ?CHIEF COMPLAINT: f/u of left breast cancer ? ?CURRENT THERAPY:  ?Neoadjuvant TCHP (docetaxel, carboplatin, trastuzumab and Perjeta), q21d, starting 10/07/21 ? ?ASSESSMENT & PLAN:  ?Katherine Donovan is a 76 y.o. post-menopausal female with  ? ?1. Malignant neoplasm of upper-outer quadrant of left breast, ILC, Stage IA, c(T1c, N0), ER+/PR-/HER2+, Grade 3  ?-found on screening mammogram. B/l MM and [place] on 09/01/21 showed a 2.9 cm left breast mass. Biopsy on 09/14/21 showed invasive lobular carcinoma, grade 2-3. ?-breast MRI on 09/21/21 showed 3 cm UOQ left breast malignancy, negative for additional malignancy or adenopathy. ?-baseline echo on 09/29/21 showed EF of 60-65%, normal. We will monitor every 3 months. ?-she began neoadjuvant TCHP on 10/07/21. She tolerated moderately well with taste change and constipation/diarrhea. She was able to recover no concern. We will proceed with cycle 2 today as scheduled. ?  ?2. Symptom Management: Constipation/Diarrhea, Taste Change with Appetite loss ?-secondary to chemo ?-she had constipation that turned into diarrhea on day 7. She is using imodium and lomotil as needed. She endorses drinking electrolyte replacements (Gatorade, Propel, etc) ?-she lost 8 lbs in the first week, but was able to gain back 4 in the last week. She endorses using Ensure. ?  ?3. Osteoporosis ?-Her most recent DEXA was 05/14/20 showing T-score of -3.6.  ?-she reports being on Fosamax consistently for the past 10 years. ? ?4.  Genetics ?-she has no family history of breast cancer. She notes melanoma in her mother and a metastatic cancer (unknown primary) in a paternal uncle. ?-testing on 09/23/21 was negative, with VUS in TMEM127 ?  ?  ?PLAN:  ?-lab reviewed, proceed with C2 TCHP today with GCSF on day 3  ?-lab, flush, f/u, and TCHP in 3 and 6 weeks ? ? ?No problem-specific Assessment & Plan notes found for this encounter. ? ? ?SUMMARY OF ONCOLOGIC HISTORY: ?Oncology History Overview Note  ? Cancer Staging  ?Malignant neoplasm of upper-outer quadrant of left breast in female, estrogen receptor positive ([place]) ?Staging form: Breast, AJCC 8th Edition ?- Clinical stage from 09/14/2021: Stage IA (cT1c, cN0, cM0, G3, ER+, PR-, HER2+) - Signed by [name], MD on 09/22/2021 ? ?  ?Malignant neoplasm of upper-outer quadrant of left breast in female, estrogen receptor positive ([place])  ?09/01/2021 Mammogram  ? CLINICAL DATA:  Bilateral screening recall for a right breast ?asymmetry and left breast mass. ?  ?EXAM: ?DIGITAL DIAGNOSTIC BILATERAL MAMMOGRAM WITH TOMOSYNTHESIS AND CAD; ?ULTRASOUND LEFT BREAST LIMITED; ULTRASOUND RIGHT BREAST LIMITED ? ?IMPRESSION: ?1. There is a highly suspicious ill-defined mass in the upper-outer ?left breast. The mass measures at least 2.9 cm, and possibly up to ?3.8 cm. Accurate measurement is difficult due to its ill-defined ?insinuating appearance. ?  ?2.  No evidence of left axillary lymphadenopathy. ?  ?3. No persistent suspicious mammographic or targeted sonographic abnormalities in the right breast. ?  ?09/14/2021 Cancer Staging  ? Staging form: Breast, AJCC 8th Edition ?- Clinical stage from 09/14/2021: Stage IA (cT1c, cN0, cM0, G3, ER+, PR-, HER2+) - Signed by [name], MD on 09/22/2021 ?  Stage prefix: Initial diagnosis ?Histologic grading system: 3 grade system ? ?  ?09/14/2021 Initial Biopsy  ? Diagnosis ?Breast, left, needle core biopsy, 1 o'clock, 4cmfn ?- INVASIVE MAMMARY CARCINOMA. SEE NOTE ?Diagnosis  Note ?Carcinoma measures 1.3 cm in greatest linear dimension and appears grade 2-3. ? ?Immunohistochemical stain for E-cadherin is negative, consistent with a lobular phenotype. ? ?PROGNOSTIC INDICATORS ?Results: ?The tumor cells are POSITIVE for Her2 (3+). ?Estrogen Receptor: 50%, POSITIVE, MODERATE-WEAK STAINING INTENSITY ?Progesterone Receptor: 0%, NEGATIVE ?Proliferation Marker Ki67: 15% ?  ?09/18/2021 Initial Diagnosis  ? Malignant neoplasm of upper-outer quadrant of left breast in female, estrogen receptor positive ([place]) ? ?  ?09/21/2021 Imaging  ? EXAM: ?BILATERAL BREAST MRI WITH AND WITHOUT CONTRAST ? ?IMPRESSION: ?1. 3 cm biopsy-proven malignancy within the UPPER-OUTER LEFT breast. ?No evidence of multifocal, multicentric or contralateral malignancy. ?No abnormal lymph nodes. ?  ?10/07/2021 -  Chemotherapy  ? Patient is on Treatment Plan : BREAST  Docetaxel + Carboplatin + Trastuzumab + Pertuzumab  (TCHP) q21d   ? ?  ?  ?10/16/2021 Genetic Testing  ? Negative genetic testing: no pathogenic variants detected in [facility] CancerNext-Expanded +RNAinsight Panel.  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 Report date is October 16, 2021.  ? ?The CancerNext-Expanded gene panel offered by [facility] and includes sequencing, rearrangement, and RNA analysis for the following 77 genes: AIP, ALK, APC, ATM, AXIN2, BAP1, BARD1, BLM, BMPR1A, BRCA1, BRCA2, BRIP1, CDC73, CDH1, CDK4, CDKN1B, CDKN2A, CHEK2, CTNNA1, DICER1, FANCC, FH, FLCN, GALNT12, KIF1B, LZTR1, MAX, MEN1, MET, MLH1, MSH2, MSH3, MSH6, MUTYH, NBN, NF1, NF2, NTHL1, PALB2, PHOX2B, PMS2, POT1, PRKAR1A, PTCH1, PTEN, RAD51C, RAD51D, RB1, RECQL, RET, SDHA, SDHAF2, SDHB, SDHC, SDHD, SMAD4, SMARCA4, SMARCB1, SMARCE1, STK11, SUFU, TMEM127, TP53, TSC1, TSC2, VHL and XRCC2 (sequencing and deletion/duplication); EGFR, EGLN1, HOXB13, KIT, MITF, PDGFRA, POLD1, and POLE (sequencing only); EPCAM and GREM1 (deletion/duplication only). ?  ? ? ? ?INTERVAL  HISTORY:  ?JABREE PERNICE is here for a follow up of breast cancer. She was last seen by me on 10/14/21. She presents to the clinic alone. ?She reports she has recovered well since her last visit. She does note she had some trouble sleeping through the night during the second week. She adds that she had trouble eating for two weeks, then she ate very well this last week. She reports her stool has been loose, no recurrence of diarrhea. ?  ?All other systems were reviewed with the patient and are negative. ? ?MEDICAL HISTORY:  ?Past Medical History:  ?Diagnosis Date  ? History of bunionectomy 09/02/2020  ? Hyperlipidemia   ? Hyperlipidemia   ? left breast ILC 09/15/21  ? Osteoporosis   ? Rosacea   ? to face  ? ? ?SURGICAL HISTORY: ?Past Surgical History:  ?Procedure Laterality Date  ? PORTACATH PLACEMENT Right 10/06/2021  ? Procedure: INSERTION PORT-A-CATH;  Surgeon: [name], MD;  Location: [facility];  Service: General;  Laterality: Right;  ? SIGMOIDOSCOPY  1999  ? TUBAL LIGATION    ? ? ?I have reviewed the social history and family history with the patient and they are unchanged from previous note. ? ?ALLERGIES:  has No Known Allergies. ? ?MEDICATIONS:  ?Current Outpatient Medications  ?Medication Sig Dispense Refill  ? alendronate (FOSAMAX) 70 MG tablet Take 70 mg by mouth once a week. Take with a full glass of water on an empty stomach.    ? Calcium Carbonate-Vitamin D 600-400 MG-UNIT tablet Take 1 tablet by mouth daily.      ?  CRESTOR 10 MG tablet Take 5 mg by mouth Daily.    ? dexamethasone (DECADRON) 4 MG tablet Take 1 tablet (4 mg total) by mouth daily. Take 1 tab twice daily the day before Taxotere. Then take 1 tab daily x 3 days after chemotherapy. 30 tablet 0  ? diphenoxylate-atropine (LOMOTIL) 2.5-0.025 MG tablet Take 2 tablets by mouth 4 (four) times daily as needed for diarrhea or loose stools (Maximum dose = 8 tablets/day). Maximum dose = 8 tablets/day 30 tablet 0  ? Glucosamine  750 MG TABS Take 750 mg by mouth daily.      ? hydrocortisone 2.5 % cream Apply 1 application. topically as needed.    ? Lactobacillus Rhamnosus, GG, (RA PROBIOTIC DIGESTIVE CARE) CAPS Take 1 capsule by mouth 3 (th

## 2021-10-30 ENCOUNTER — Other Ambulatory Visit: Payer: Self-pay

## 2021-10-30 ENCOUNTER — Inpatient Hospital Stay: Payer: Medicare Other

## 2021-10-30 DIAGNOSIS — C50412 Malignant neoplasm of upper-outer quadrant of left female breast: Secondary | ICD-10-CM

## 2021-10-30 DIAGNOSIS — Z5112 Encounter for antineoplastic immunotherapy: Secondary | ICD-10-CM | POA: Diagnosis not present

## 2021-10-30 MED ORDER — PEGFILGRASTIM-CBQV 6 MG/0.6ML ~~LOC~~ SOSY
6.0000 mg | PREFILLED_SYRINGE | Freq: Once | SUBCUTANEOUS | Status: AC
  Administered 2021-10-30: 6 mg via SUBCUTANEOUS
  Filled 2021-10-30: qty 0.6

## 2021-10-30 NOTE — Patient Instructions (Signed)

## 2021-11-04 ENCOUNTER — Telehealth: Payer: Self-pay | Admitting: Hematology

## 2021-11-04 NOTE — Telephone Encounter (Signed)
Left message with rescheduled upcoming appointments due to provider's breast clinic. °

## 2021-11-16 MED FILL — Dexamethasone Sodium Phosphate Inj 100 MG/10ML: INTRAMUSCULAR | Qty: 1 | Status: AC

## 2021-11-16 MED FILL — Fosaprepitant Dimeglumine For IV Infusion 150 MG (Base Eq): INTRAVENOUS | Qty: 5 | Status: AC

## 2021-11-17 ENCOUNTER — Inpatient Hospital Stay (HOSPITAL_BASED_OUTPATIENT_CLINIC_OR_DEPARTMENT_OTHER): Payer: Medicare Other | Admitting: Hematology

## 2021-11-17 ENCOUNTER — Inpatient Hospital Stay: Payer: Medicare Other

## 2021-11-17 ENCOUNTER — Encounter: Payer: Self-pay | Admitting: Hematology

## 2021-11-17 ENCOUNTER — Inpatient Hospital Stay: Payer: Medicare Other | Attending: Hematology

## 2021-11-17 ENCOUNTER — Inpatient Hospital Stay: Payer: Medicare Other | Admitting: Dietician

## 2021-11-17 ENCOUNTER — Other Ambulatory Visit: Payer: Self-pay

## 2021-11-17 VITALS — BP 113/65 | HR 70 | Temp 97.9°F | Resp 18

## 2021-11-17 VITALS — BP 158/79 | HR 73 | Temp 97.7°F | Resp 18 | Ht 62.0 in | Wt 122.6 lb

## 2021-11-17 DIAGNOSIS — Z5111 Encounter for antineoplastic chemotherapy: Secondary | ICD-10-CM | POA: Insufficient documentation

## 2021-11-17 DIAGNOSIS — T451X5A Adverse effect of antineoplastic and immunosuppressive drugs, initial encounter: Secondary | ICD-10-CM | POA: Insufficient documentation

## 2021-11-17 DIAGNOSIS — Z170 Estrogen receptor positive status [ER+]: Secondary | ICD-10-CM | POA: Diagnosis not present

## 2021-11-17 DIAGNOSIS — Z5112 Encounter for antineoplastic immunotherapy: Secondary | ICD-10-CM | POA: Insufficient documentation

## 2021-11-17 DIAGNOSIS — M810 Age-related osteoporosis without current pathological fracture: Secondary | ICD-10-CM | POA: Diagnosis not present

## 2021-11-17 DIAGNOSIS — Z79899 Other long term (current) drug therapy: Secondary | ICD-10-CM | POA: Diagnosis not present

## 2021-11-17 DIAGNOSIS — K521 Toxic gastroenteritis and colitis: Secondary | ICD-10-CM | POA: Diagnosis not present

## 2021-11-17 DIAGNOSIS — C50412 Malignant neoplasm of upper-outer quadrant of left female breast: Secondary | ICD-10-CM | POA: Diagnosis present

## 2021-11-17 DIAGNOSIS — R634 Abnormal weight loss: Secondary | ICD-10-CM | POA: Insufficient documentation

## 2021-11-17 LAB — CBC WITH DIFFERENTIAL (CANCER CENTER ONLY)
Abs Immature Granulocytes: 0.04 10*3/uL (ref 0.00–0.07)
Basophils Absolute: 0 10*3/uL (ref 0.0–0.1)
Basophils Relative: 0 %
Eosinophils Absolute: 0 10*3/uL (ref 0.0–0.5)
Eosinophils Relative: 0 %
HCT: 36.3 % (ref 36.0–46.0)
Hemoglobin: 12.2 g/dL (ref 12.0–15.0)
Immature Granulocytes: 0 %
Lymphocytes Relative: 10 %
Lymphs Abs: 1.4 10*3/uL (ref 0.7–4.0)
MCH: 30.8 pg (ref 26.0–34.0)
MCHC: 33.6 g/dL (ref 30.0–36.0)
MCV: 91.7 fL (ref 80.0–100.0)
Monocytes Absolute: 1.4 10*3/uL — ABNORMAL HIGH (ref 0.1–1.0)
Monocytes Relative: 10 %
Neutro Abs: 11 10*3/uL — ABNORMAL HIGH (ref 1.7–7.7)
Neutrophils Relative %: 80 %
Platelet Count: 348 10*3/uL (ref 150–400)
RBC: 3.96 MIL/uL (ref 3.87–5.11)
RDW: 14.2 % (ref 11.5–15.5)
WBC Count: 13.8 10*3/uL — ABNORMAL HIGH (ref 4.0–10.5)
nRBC: 0 % (ref 0.0–0.2)

## 2021-11-17 LAB — CMP (CANCER CENTER ONLY)
ALT: 18 U/L (ref 0–44)
AST: 21 U/L (ref 15–41)
Albumin: 4.1 g/dL (ref 3.5–5.0)
Alkaline Phosphatase: 61 U/L (ref 38–126)
Anion gap: 8 (ref 5–15)
BUN: 18 mg/dL (ref 8–23)
CO2: 27 mmol/L (ref 22–32)
Calcium: 10.1 mg/dL (ref 8.9–10.3)
Chloride: 106 mmol/L (ref 98–111)
Creatinine: 0.66 mg/dL (ref 0.44–1.00)
GFR, Estimated: >60 mL/min (ref >60)
Glucose, Bld: 92 mg/dL (ref 70–99)
Potassium: 3.9 mmol/L (ref 3.5–5.1)
Sodium: 141 mmol/L (ref 135–145)
Total Bilirubin: 0.5 mg/dL (ref 0.3–1.2)
Total Protein: 6.3 g/dL — ABNORMAL LOW (ref 6.5–8.1)

## 2021-11-17 MED ORDER — DIPHENHYDRAMINE HCL 25 MG PO CAPS
50.0000 mg | ORAL_CAPSULE | Freq: Once | ORAL | Status: AC
  Administered 2021-11-17: 50 mg via ORAL
  Filled 2021-11-17: qty 2

## 2021-11-17 MED ORDER — ACETAMINOPHEN 325 MG PO TABS
650.0000 mg | ORAL_TABLET | Freq: Once | ORAL | Status: AC
  Administered 2021-11-17: 650 mg via ORAL
  Filled 2021-11-17: qty 2

## 2021-11-17 MED ORDER — SODIUM CHLORIDE 0.9 % IV SOLN
150.0000 mg | Freq: Once | INTRAVENOUS | Status: AC
  Administered 2021-11-17: 150 mg via INTRAVENOUS
  Filled 2021-11-17: qty 150

## 2021-11-17 MED ORDER — SODIUM CHLORIDE 0.9 % IV SOLN
10.0000 mg | Freq: Once | INTRAVENOUS | Status: AC
  Administered 2021-11-17: 10 mg via INTRAVENOUS
  Filled 2021-11-17: qty 10

## 2021-11-17 MED ORDER — SODIUM CHLORIDE 0.9 % IV SOLN
420.0000 mg | Freq: Once | INTRAVENOUS | Status: AC
  Administered 2021-11-17: 420 mg via INTRAVENOUS
  Filled 2021-11-17: qty 14

## 2021-11-17 MED ORDER — HEPARIN SOD (PORK) LOCK FLUSH 100 UNIT/ML IV SOLN
500.0000 [IU] | Freq: Once | INTRAVENOUS | Status: AC | PRN
  Administered 2021-11-17: 500 [IU]

## 2021-11-17 MED ORDER — SODIUM CHLORIDE 0.9 % IV SOLN
75.0000 mg/m2 | Freq: Once | INTRAVENOUS | Status: AC
  Administered 2021-11-17: 120 mg via INTRAVENOUS
  Filled 2021-11-17: qty 12

## 2021-11-17 MED ORDER — SODIUM CHLORIDE 0.9 % IV SOLN: Freq: Once | INTRAVENOUS | Status: AC

## 2021-11-17 MED ORDER — TRASTUZUMAB-DKST CHEMO 150 MG IV SOLR
6.0000 mg/kg | Freq: Once | INTRAVENOUS | Status: AC
  Administered 2021-11-17: 336 mg via INTRAVENOUS
  Filled 2021-11-17: qty 16

## 2021-11-17 MED ORDER — PALONOSETRON HCL INJECTION 0.25 MG/5ML
0.2500 mg | Freq: Once | INTRAVENOUS | Status: AC
  Administered 2021-11-17: 0.25 mg via INTRAVENOUS
  Filled 2021-11-17: qty 5

## 2021-11-17 MED ORDER — SODIUM CHLORIDE 0.9% FLUSH
10.0000 mL | INTRAVENOUS | Status: DC | PRN
  Administered 2021-11-17: 10 mL

## 2021-11-17 MED ORDER — SODIUM CHLORIDE 0.9 % IV SOLN
349.0000 mg | Freq: Once | INTRAVENOUS | Status: AC
  Administered 2021-11-17: 350 mg via INTRAVENOUS
  Filled 2021-11-17: qty 35

## 2021-11-17 NOTE — Progress Notes (Signed)
?Katherine Donovan   ?[phone] [phone]   ?Clinic Follow up Note  ? ?Patient Care Team: ?Nickola Major, MD as PCP - General (Family Medicine) ?Mauro Kaufmann, RN as Oncology Nurse Navigator ?Rockwell Germany, RN as Oncology Nurse Navigator ?[name], MD as Consulting Physician (General Surgery) ?[name], MD as Consulting Physician (Hematology) ?Kyung Rudd, MD as Consulting Physician (Radiation Oncology) ? ?Date of Service:  11/17/2021 ? ?CHIEF COMPLAINT: f/u of left breast cancer ? ?CURRENT THERAPY:  ?Neoadjuvant TCHP (docetaxel, carboplatin, trastuzumab and Perjeta), q21d, starting 10/07/21 ? ?ASSESSMENT & PLAN:  ?Katherine Donovan is a 76 y.o. post-menopausal female with  ? ?1. Malignant neoplasm of upper-outer quadrant of left breast, ILC, Stage IA, c(T1c, N0), ER+/PR-/HER2+, Grade 3  ?-found on screening mammogram. B/l MM and [place] on 09/01/21 showed a 2.9 cm left breast mass. Biopsy on 09/14/21 showed invasive lobular carcinoma, grade 2-3. ?-breast MRI on 09/21/21 showed 3 cm UOQ left breast malignancy, negative for additional malignancy or adenopathy. ?-baseline echo on 09/29/21 showed EF of 60-65%, normal. We will monitor every 3 months. ?-for reference, left breast mass measured 3 cm on physical exam the first day of chemo. ?-she began neoadjuvant TCHP on 10/07/21. She tolerated cycle 2 much better with regular-dose (non-loading) herceptin and perjeta, reports only diarrhea. Physical exam shows her left breast mass is 1.5 x 1 cm today, slightly smaller. We will proceed with cycle 3 today as scheduled. ?-will monitor closely ?  ?2. Symptom Management: Diarrhea, Weight loss ?-secondary to chemo ?-she had one bout of diarrhea after cycle 2 ?-her weight is overall stable at this time. She reports she is eating very well but not gaining weight ?  ?3. Osteoporosis ?-Her most recent DEXA was 05/14/20 showing T-score of -3.6.  ?-she reports being on Fosamax consistently for  the past 10 years. ?  ?4. Genetics ?-she has no family history of breast cancer. She notes melanoma in her mother and a metastatic cancer (unknown primary) in a paternal uncle. ?-testing on 09/23/21 was negative, with VUS in TMEM127 ?  ?  ?PLAN:  ?-proceed with C3 TCHP today with GCSF on day 3  ?-lab, flush, f/u, and TCHP every 3 weeks as scheduled ? ? ?No problem-specific Assessment & Plan notes found for this encounter. ? ? ?SUMMARY OF ONCOLOGIC HISTORY: ?Oncology History Overview Note  ? Cancer Staging  ?Malignant neoplasm of upper-outer quadrant of left breast in female, estrogen receptor positive ([place]) ?Staging form: Breast, AJCC 8th Edition ?- Clinical stage from 09/14/2021: Stage IA (cT1c, cN0, cM0, G3, ER+, PR-, HER2+) - Signed by [name], MD on 09/22/2021 ? ?  ?Malignant neoplasm of upper-outer quadrant of left breast in female, estrogen receptor positive ([place])  ?09/01/2021 Mammogram  ? CLINICAL DATA:  Bilateral screening recall for a right breast ?asymmetry and left breast mass. ?  ?EXAM: ?DIGITAL DIAGNOSTIC BILATERAL MAMMOGRAM WITH TOMOSYNTHESIS AND CAD; ?ULTRASOUND LEFT BREAST LIMITED; ULTRASOUND RIGHT BREAST LIMITED ? ?IMPRESSION: ?1. There is a highly suspicious ill-defined mass in the upper-outer ?left breast. The mass measures at least 2.9 cm, and possibly up to ?3.8 cm. Accurate measurement is difficult due to its ill-defined ?insinuating appearance. ?  ?2.  No evidence of left axillary lymphadenopathy. ?  ?3. No persistent suspicious mammographic or targeted sonographic abnormalities in the right breast. ?  ?09/14/2021 Cancer Staging  ? Staging form: Breast, AJCC 8th Edition ?- Clinical stage from 09/14/2021: Stage IA (cT1c, cN0, cM0, G3, ER+, PR-, HER2+) -  Signed by [name], MD on 09/22/2021 ?Stage prefix: Initial diagnosis ?Histologic grading system: 3 grade system ? ?  ?09/14/2021 Initial Biopsy  ? Diagnosis ?Breast, left, needle core biopsy, 1 o'clock, 4cmfn ?- INVASIVE MAMMARY CARCINOMA. SEE  NOTE ?Diagnosis Note ?Carcinoma measures 1.3 cm in greatest linear dimension and appears grade 2-3. ? ?Immunohistochemical stain for E-cadherin is negative, consistent with a lobular phenotype. ? ?PROGNOSTIC INDICATORS ?Results: ?The tumor cells are POSITIVE for Her2 (3+). ?Estrogen Receptor: 50%, POSITIVE, MODERATE-WEAK STAINING INTENSITY ?Progesterone Receptor: 0%, NEGATIVE ?Proliferation Marker Ki67: 15% ?  ?09/18/2021 Initial Diagnosis  ? Malignant neoplasm of upper-outer quadrant of left breast in female, estrogen receptor positive ([place]) ? ?  ?09/21/2021 Imaging  ? EXAM: ?BILATERAL BREAST MRI WITH AND WITHOUT CONTRAST ? ?IMPRESSION: ?1. 3 cm biopsy-proven malignancy within the UPPER-OUTER LEFT breast. ?No evidence of multifocal, multicentric or contralateral malignancy. ?No abnormal lymph nodes. ?  ?10/07/2021 -  Chemotherapy  ? Patient is on Treatment Plan : BREAST  Docetaxel + Carboplatin + Trastuzumab + Pertuzumab  (TCHP) q21d   ? ?   ?10/16/2021 Genetic Testing  ? Negative genetic testing: no pathogenic variants detected in [facility] CancerNext-Expanded +RNAinsight Panel.  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 Report date is October 16, 2021.  ? ?The CancerNext-Expanded gene panel offered by [facility] and includes sequencing, rearrangement, and RNA analysis for the following 77 genes: AIP, ALK, APC, ATM, AXIN2, BAP1, BARD1, BLM, BMPR1A, BRCA1, BRCA2, BRIP1, CDC73, CDH1, CDK4, CDKN1B, CDKN2A, CHEK2, CTNNA1, DICER1, FANCC, FH, FLCN, GALNT12, KIF1B, LZTR1, MAX, MEN1, MET, MLH1, MSH2, MSH3, MSH6, MUTYH, NBN, NF1, NF2, NTHL1, PALB2, PHOX2B, PMS2, POT1, PRKAR1A, PTCH1, PTEN, RAD51C, RAD51D, RB1, RECQL, RET, SDHA, SDHAF2, SDHB, SDHC, SDHD, SMAD4, SMARCA4, SMARCB1, SMARCE1, STK11, SUFU, TMEM127, TP53, TSC1, TSC2, VHL and XRCC2 (sequencing and deletion/duplication); EGFR, EGLN1, HOXB13, KIT, MITF, PDGFRA, POLD1, and POLE (sequencing only); EPCAM and GREM1 (deletion/duplication only). ?   ? ? ? ?INTERVAL HISTORY:  ?Katherine Donovan is here for a follow up of breast cancer. She was last seen by me on 10/28/21. She presents to the clinic alone. ?She reports she did well with last cycle. She notes she dealt with a bout of diarrhea, but she otherwise tolerated well. She reports she is eating very well but not gaining weight. ?  ?All other systems were reviewed with the patient and are negative. ? ?MEDICAL HISTORY:  ?Past Medical History:  ?Diagnosis Date  ? History of bunionectomy 09/02/2020  ? Hyperlipidemia   ? Hyperlipidemia   ? left breast ILC 09/15/21  ? Osteoporosis   ? Rosacea   ? to face  ? ? ?SURGICAL HISTORY: ?Past Surgical History:  ?Procedure Laterality Date  ? PORTACATH PLACEMENT Right 10/06/2021  ? Procedure: INSERTION PORT-A-CATH;  Surgeon: [name], MD;  Location: [facility];  Service: General;  Laterality: Right;  ? SIGMOIDOSCOPY  1999  ? TUBAL LIGATION    ? ? ?I have reviewed the social history and family history with the patient and they are unchanged from previous note. ? ?ALLERGIES:  has No Known Allergies. ? ?MEDICATIONS:  ?Current Outpatient Medications  ?Medication Sig Dispense Refill  ? alendronate (FOSAMAX) 70 MG tablet Take 70 mg by mouth once a week. Take with a full glass of water on an empty stomach.    ? Calcium Carbonate-Vitamin D 600-400 MG-UNIT tablet Take 1 tablet by mouth daily.      ? CRESTOR 10 MG tablet Take 5 mg by mouth  Daily.    ? dexamethasone (DECADRON) 4 MG tablet Take 1 tablet (4 mg total) by mouth daily. Take 1 tab twice daily the day before Taxotere. Then take 1 tab daily x 3 days after chemotherapy. 30 tablet 0  ? diphenoxylate-atropine (LOMOTIL) 2.5-0.025 MG tablet Take 2 tablets by mouth 4 (four) times daily as needed for diarrhea or loose stools (Maximum dose = 8 tablets/day). Maximum dose = 8 tablets/day 30 tablet 0  ? Glucosamine 750 MG TABS Take 750 mg by mouth daily.      ? hydrocortisone 2.5 % cream Apply 1 application.  topically as needed.    ? Lactobacillus Rhamnosus, GG, (RA PROBIOTIC DIGESTIVE CARE) CAPS Take 1 capsule by mouth 3 (three) times a week.    ? lidocaine-prilocaine (EMLA) cream Apply to affected area once 30 g 3  ? m

## 2021-11-17 NOTE — Patient Instructions (Signed)
[facility]  Discharge Instructions: ?Thank you for choosing [facility] to provide your oncology and hematology care.  ? ?If you have a lab appointment with the [facility], please go directly to the [facility] and check in at the registration area. ?  ?Wear comfortable clothing and clothing appropriate for easy access to any Portacath or PICC line.  ? ?We strive to give you quality time with your provider. You may need to reschedule your appointment if you arrive late (15 or more minutes).  Arriving late affects you and other patients whose appointments are after yours.  Also, if you miss three or more appointments without notifying the office, you may be dismissed from the clinic at the provider?s discretion.    ?  ?For prescription refill requests, have your pharmacy contact our office and allow 72 hours for refills to be completed.   ? ?Today you received the following chemotherapy and/or immunotherapy agents: [place]/Perjeta/Docetaxel/Carboplatin    ?  ?To help prevent nausea and vomiting after your treatment, we encourage you to take your nausea medication as directed. ? ?BELOW ARE SYMPTOMS THAT SHOULD BE REPORTED IMMEDIATELY: ?*FEVER GREATER THAN 100.4 F (38 ?C) OR HIGHER ?*CHILLS OR SWEATING ?*NAUSEA AND VOMITING THAT IS NOT CONTROLLED WITH YOUR NAUSEA MEDICATION ?*UNUSUAL SHORTNESS OF BREATH ?*UNUSUAL BRUISING OR BLEEDING ?*URINARY PROBLEMS (pain or burning when urinating, or frequent urination) ?*BOWEL PROBLEMS (unusual diarrhea, constipation, pain near the anus) ?TENDERNESS IN MOUTH AND THROAT WITH OR WITHOUT PRESENCE OF ULCERS (sore throat, sores in mouth, or a toothache) ?UNUSUAL RASH, SWELLING OR PAIN  ?UNUSUAL VAGINAL DISCHARGE OR ITCHING  ? ?Items with * indicate a potential emergency and should be followed up as soon as possible or go to the Emergency Department if any problems should occur. ? ?Please show the CHEMOTHERAPY ALERT CARD or IMMUNOTHERAPY  ALERT CARD at check-in to the Emergency Department and triage nurse. ? ?Should you have questions after your visit or need to cancel or reschedule your appointment, please contact [facility]  Dept: [phone]  and follow the prompts.  Office hours are 8:00 a.m. to 4:30 p.m. Monday - Friday. Please note that voicemails left after 4:00 p.m. may not be returned until the following business day.  We are closed weekends and major holidays. You have access to a nurse at all times for urgent questions. Please call the main number to the clinic Dept: [phone] and follow the prompts. ? ? ?For any non-urgent questions, you may also contact your provider using MyChart. We now offer e-Visits for anyone 61 and older to request care online for non-urgent symptoms. For details visit [email or web address]. ?  ?Also download the MyChart app! Go to the app store, search "MyChart", open the app, select [facility], and log in with your MyChart username and password. ? ?Due to Covid, a mask is required upon entering the hospital/clinic. If you do not have a mask, one will be given to you upon arrival. For doctor visits, patients may have 1 support person aged 71 or older with them. For treatment visits, patients cannot have anyone with them due to current Covid guidelines and our immunocompromised population.  ? ?

## 2021-11-17 NOTE — Progress Notes (Signed)
Nutrition Follow-up: ? ?Patient with left breast cancer. She is receiving neoadjuvant TCHP q21d.  ? ?Met with patient in infusion. She reports tolerating treatment well. Patient endorses maintaining good appetite and energy levels. She does experience mild fatigue lasting ~3 days after treatment. She is drinking one Ensure Plus each day during this time. Patient recalls eating 3 meals and a variety of foods (scrambled eggs, grits, toast, steak, corn, potatoes, peanut butter cracker, cupcakes, lots of vegetables. Patient snacks on pretzels in between meals. She reports drinking water consistently through out the day as well as some propell. She denies nausea, vomiting, constipation. She reports mild diarrhea after treatment. Imodium is working well for this as needed. Patient is staying active, reports riding her elliptical for 10-15 minutes most every day.   ? ? ?Medications: reviewed  ? ?Labs: reviewed  ? ?Anthropometrics: Weight 122 lb 9.6 oz today decreased  ? ?4/26 - 124 lb 8 oz ?4/05 - 129 lb 14.4 oz  ? ? ?NUTRITION DIAGNOSIS: Inadequate oral intake continues  ? ? ? ?INTERVENTION:  ?Encouraged high calorie, high protein foods for weight maintenance - handout with ideas provided  ?Reviewed foods with protein, educated to have protein source with all meals and snacks ?Continue drinking Ensure Plus equivalent, recommend once daily - coupons provided  ?Continue activity as able  ?Patient has contact information  ?  ? ?MONITORING, EVALUATION, GOAL: weight trends, intake  ? ? ?NEXT VISIT: Wednesday June 28 during infusion  ? ? ? ?

## 2021-11-18 ENCOUNTER — Inpatient Hospital Stay: Payer: Medicare Other

## 2021-11-18 ENCOUNTER — Encounter: Payer: Medicare Other | Admitting: Dietician

## 2021-11-18 ENCOUNTER — Inpatient Hospital Stay: Payer: Medicare Other | Admitting: Hematology

## 2021-11-19 ENCOUNTER — Other Ambulatory Visit: Payer: Self-pay

## 2021-11-19 ENCOUNTER — Inpatient Hospital Stay: Payer: Medicare Other

## 2021-11-19 VITALS — BP 150/80 | HR 73 | Temp 98.0°F | Resp 18

## 2021-11-19 DIAGNOSIS — Z170 Estrogen receptor positive status [ER+]: Secondary | ICD-10-CM

## 2021-11-19 DIAGNOSIS — C50412 Malignant neoplasm of upper-outer quadrant of left female breast: Secondary | ICD-10-CM | POA: Diagnosis not present

## 2021-11-19 MED ORDER — PEGFILGRASTIM-CBQV 6 MG/0.6ML ~~LOC~~ SOSY
6.0000 mg | PREFILLED_SYRINGE | Freq: Once | SUBCUTANEOUS | Status: AC
  Administered 2021-11-19: 6 mg via SUBCUTANEOUS
  Filled 2021-11-19: qty 0.6

## 2021-11-20 ENCOUNTER — Inpatient Hospital Stay: Payer: Medicare Other

## 2021-12-04 NOTE — Progress Notes (Signed)
[facility] OFFICE PROGRESS NOTE  Katherine Major, MD [address] [place] [address]  DIAGNOSIS:  f/u of left breast cancer  Oncology History Overview Note   Cancer Staging  Malignant neoplasm of upper-outer quadrant of left breast in female, estrogen receptor positive ([place]) Staging form: Breast, AJCC 8th Edition - Clinical stage from 09/14/2021: Stage IA (cT1c, cN0, cM0, G3, ER+, PR-, HER2+) - Signed by [name], MD on 09/22/2021    Malignant neoplasm of upper-outer quadrant of left breast in female, estrogen receptor positive ([place])  09/01/2021 Mammogram   CLINICAL DATA:  Bilateral screening recall for a right breast asymmetry and left breast mass.   EXAM: DIGITAL DIAGNOSTIC BILATERAL MAMMOGRAM WITH TOMOSYNTHESIS AND CAD; ULTRASOUND LEFT BREAST LIMITED; ULTRASOUND RIGHT BREAST LIMITED  IMPRESSION: 1. There is a highly suspicious ill-defined mass in the upper-outer left breast. The mass measures at least 2.9 cm, and possibly up to 3.8 cm. Accurate measurement is difficult due to its ill-defined insinuating appearance.   2.  No evidence of left axillary lymphadenopathy.   3. No persistent suspicious mammographic or targeted sonographic abnormalities in the right breast.   09/14/2021 Cancer Staging   Staging form: Breast, AJCC 8th Edition - Clinical stage from 09/14/2021: Stage IA (cT1c, cN0, cM0, G3, ER+, PR-, HER2+) - Signed by [name], MD on 09/22/2021 Stage prefix: Initial diagnosis Histologic grading system: 3 grade system    09/14/2021 Initial Biopsy   Diagnosis Breast, left, needle core biopsy, 1 o'clock, 4cmfn - INVASIVE MAMMARY CARCINOMA. SEE NOTE Diagnosis Note Carcinoma measures 1.3 cm in greatest linear dimension and appears grade 2-3.  Immunohistochemical stain for E-cadherin is negative, consistent with a lobular phenotype.  PROGNOSTIC INDICATORS Results: The tumor cells are POSITIVE for Her2 (3+). Estrogen Receptor: 50%,  POSITIVE, MODERATE-WEAK STAINING INTENSITY Progesterone Receptor: 0%, NEGATIVE Proliferation Marker Ki67: 15%   09/18/2021 Initial Diagnosis   Malignant neoplasm of upper-outer quadrant of left breast in female, estrogen receptor positive ([place])    09/21/2021 Imaging   EXAM: BILATERAL BREAST MRI WITH AND WITHOUT CONTRAST  IMPRESSION: 1. 3 cm biopsy-proven malignancy within the UPPER-OUTER LEFT breast. No evidence of multifocal, multicentric or contralateral malignancy. No abnormal lymph nodes.   10/07/2021 -  Chemotherapy   Patient is on Treatment Plan : BREAST  Docetaxel + Carboplatin + Trastuzumab + Pertuzumab  (TCHP) q21d       10/16/2021 Genetic Testing   Negative genetic testing: no pathogenic variants detected in [facility] CancerNext-Expanded +RNAinsight Panel.  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 Report date is October 16, 2021.   The CancerNext-Expanded gene panel offered by [facility] and includes sequencing, rearrangement, and RNA analysis for the following 77 genes: AIP, ALK, APC, ATM, AXIN2, BAP1, BARD1, BLM, BMPR1A, BRCA1, BRCA2, BRIP1, CDC73, CDH1, CDK4, CDKN1B, CDKN2A, CHEK2, CTNNA1, DICER1, FANCC, FH, FLCN, GALNT12, KIF1B, LZTR1, MAX, MEN1, MET, MLH1, MSH2, MSH3, MSH6, MUTYH, NBN, NF1, NF2, NTHL1, PALB2, PHOX2B, PMS2, POT1, PRKAR1A, PTCH1, PTEN, RAD51C, RAD51D, RB1, RECQL, RET, SDHA, SDHAF2, SDHB, SDHC, SDHD, SMAD4, SMARCA4, SMARCB1, SMARCE1, STK11, SUFU, TMEM127, TP53, TSC1, TSC2, VHL and XRCC2 (sequencing and deletion/duplication); EGFR, EGLN1, HOXB13, KIT, MITF, PDGFRA, POLD1, and POLE (sequencing only); EPCAM and GREM1 (deletion/duplication only).      CURRENT THERAPY: Neoadjuvant TCHP (docetaxel, carboplatin, trastuzumab and Perjeta), q21d, starting 10/07/21  INTERVAL HISTORY: Katherine Donovan 76 y.o. female returns to the clinic today for a follow-up visit.  The patient is feeling fairly well today without  any concerning complaints. She is  feeling at her baseline. She is currently undergoing adjuvant treatment and she is tolerating fairly well.  She has establish a routine/rhythm on how to manage her anticipated side effects. she notices she has a drop in her appetite the week following treatment for which she will drink ensures if needed.  Her weight is stable compared to last visit.  She is scheduled to see the nutritionist at her next appointment.  Stages with appetite then dropps off and force to eat. Then a week later gains her appetite and makes sure to eat well to gain back the weight she has lost.  She denies any fever, chills, or night sweats.  She denies any nausea, vomiting, or constipation recently.  She reports she has some constipation a few days following her infusion which is typically followed by diarrhea.  She states that she has more than 3 loose stools that she will take Imodium which completely resolves her diarrhea immediately. She denies any chest pain, shortness of breath, cough, or hemoptysis.  She reports she gets face "pimples" with infusion.  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  She states this is similar to her last appointment and has not worsened.  She is here today for evaluation and repeat blood work before undergoing her next cycle of treatment.    MEDICAL HISTORY: Past Medical History:  Diagnosis Date   History of bunionectomy 09/02/2020   Hyperlipidemia    Hyperlipidemia    left breast ILC 09/15/21   Osteoporosis    Rosacea    to face    ALLERGIES:  has No Known Allergies.  MEDICATIONS:  Current Outpatient Medications  Medication Sig Dispense Refill   alendronate (FOSAMAX) 70 MG tablet Take 70 mg by mouth once a week. Take with a full glass of water on an empty stomach.     Calcium Carbonate-Vitamin D 600-400 MG-UNIT tablet Take 1 tablet by mouth daily.       CRESTOR 10 MG tablet Take 5 mg by mouth Daily.      dexamethasone (DECADRON) 4 MG tablet Take 1 tablet (4 mg total) by mouth daily. Take 1 tab twice daily the day before Taxotere. Then take 1 tab daily x 3 days after chemotherapy. 30 tablet 0   diphenoxylate-atropine (LOMOTIL) 2.5-0.025 MG tablet Take 2 tablets by mouth 4 (four) times daily as needed for diarrhea or loose stools (Maximum dose = 8 tablets/day). Maximum dose = 8 tablets/day 30 tablet 0   Glucosamine 750 MG TABS Take 750 mg by mouth daily.       hydrocortisone 2.5 % cream Apply 1 application. topically as needed.     Lactobacillus Rhamnosus, GG, (RA PROBIOTIC DIGESTIVE CARE) CAPS Take 1 capsule by mouth 3 (three) times a week.     lidocaine-prilocaine (EMLA) cream Apply to affected area once 30 g 3   metroNIDAZOLE (METROGEL) 0.75 % gel Apply 1 application. topically 2 (two) times daily.     Misc Natural Products (JOINT SUPPORT COMPLEX PO) Take by mouth daily. [place] Joint[employer]-Cartilage-Bone Triple Action Joint Health; Undenatured Type II Collagen     Multiple Vitamin (MULTI-VITAMIN) tablet Take 1 tablet by mouth daily.     ondansetron (ZOFRAN) 8 MG tablet Take 1 tablet (8 mg total) by mouth 2 (two) times daily as needed (Nausea or vomiting). Start on the third day after chemotherapy. 30 tablet 1   prochlorperazine (COMPAZINE) 10 MG  tablet Take 1 tablet (10 mg total) by mouth every 6 (six) hours as needed (Nausea or vomiting). 30 tablet 1   No current facility-administered medications for this visit.    SURGICAL HISTORY:  Past Surgical History:  Procedure Laterality Date   PORTACATH PLACEMENT Right 10/06/2021   Procedure: INSERTION PORT-A-CATH;  Surgeon: [name], MD;  Location: [facility];  Service: General;  Laterality: Right;   SIGMOIDOSCOPY  1999   TUBAL LIGATION      REVIEW OF SYSTEMS:   Review of Systems  Constitutional: Positive for fatigue, decreased appetite, weight loss particularly the week following treatment.  Negative for chills and fevers.   HENT: Negative for mouth sores, nosebleeds, sore throat and trouble swallowing.   Eyes: Negative for eye problems and icterus.  Respiratory: Negative for cough, hemoptysis, shortness of breath and wheezing.   Cardiovascular: Negative for chest pain and leg swelling.  Gastrointestinal: Positive for constipation and diarrhea few days following infusions.  Negative for abdominal pain, nausea and vomiting.  Genitourinary: Negative for bladder incontinence, difficulty urinating, dysuria, frequency and hematuria.   Musculoskeletal: Negative for back pain, gait problem, neck pain and neck stiffness.  Skin: Negative for itching and rash.  Neurological: Positive for mild intermittent peripheral neuropathy at the tips of her fingers.  Negative for dizziness, extremity weakness, gait problem, headaches, light-headedness and seizures.  Hematological: Negative for adenopathy. Does not bruise/bleed easily.  Psychiatric/Behavioral: Negative for confusion, depression and sleep disturbance. The patient is not nervous/anxious.     PHYSICAL EXAMINATION:  Blood pressure (!) 146/77, pulse 79, temperature (!) 97 F (36.1 C), temperature source Tympanic, resp. rate 16, height '5\' 2"'  (1.575 m), weight 122 lb 6.4 oz (55.5 kg), SpO2 99 %.  ECOG PERFORMANCE STATUS: 1  Physical Exam  Constitutional: Oriented to person, place, and time and well-developed, well-nourished, and in no distress.  HENT:  Head: Normocephalic and atraumatic.  Mouth/Throat: Oropharynx is clear and moist. No oropharyngeal exudate.  Eyes: Conjunctivae are normal. Right eye exhibits no discharge. Left eye exhibits no discharge. No scleral icterus.  Neck: Normal range of motion. Neck supple.  Cardiovascular: Normal rate, regular rhythm, normal heart sounds and intact distal pulses.   Pulmonary/Chest: Effort normal and breath sounds normal. No respiratory distress. No wheezes. No rales.  Abdominal: Soft. Bowel sounds are normal. Exhibits no  distension and no mass. There is no tenderness.  Musculoskeletal: Normal range of motion. Exhibits no edema.  Lymphadenopathy:    No cervical adenopathy.  Neurological: Alert and oriented to person, place, and time. Exhibits normal muscle tone. Gait normal. Coordination normal.  Skin: Skin is warm and dry. No rash noted. Not diaphoretic. No erythema. No pallor.  Psychiatric: Mood, memory and judgment normal.  Vitals reviewed.  LABORATORY DATA: Lab Results  Component Value Date   WBC 15.9 (H) 12/09/2021   HGB 11.7 (L) 12/09/2021   HCT 35.4 (L) 12/09/2021   MCV 94.7 12/09/2021   PLT 310 12/09/2021      Chemistry      Component Value Date/Time   NA 140 12/09/2021 0856   K 4.0 12/09/2021 0856   CL 108 12/09/2021 0856   CO2 26 12/09/2021 0856   BUN 18 12/09/2021 0856   CREATININE 0.63 12/09/2021 0856      Component Value Date/Time   CALCIUM 10.1 12/09/2021 0856   ALKPHOS 60 12/09/2021 0856   AST 22 12/09/2021 0856   ALT 15 12/09/2021 0856   BILITOT 0.4 12/09/2021 0856  RADIOGRAPHIC STUDIES:  No results found.   ASSESSMENT/PLAN:  Katherine Donovan is a 76 y.o. post-menopausal female with    1. Malignant neoplasm of upper-outer quadrant of left breast, ILC, Stage IA, c(T1c, N0), ER+/PR-/HER2+, Grade 3  -found on screening mammogram. B/l MM and [place] on 09/01/21 showed a 2.9 cm left breast mass. Biopsy on 09/14/21 showed invasive lobular carcinoma, grade 2-3. -breast MRI on 09/21/21 showed 3 cm UOQ left breast malignancy, negative for additional malignancy or adenopathy. -baseline echo on 09/29/21 showed EF of 60-65%, normal. We will monitor every 3 months. -for reference, left breast mass measured 3 cm on physical exam the first day of chemo. -she began neoadjuvant TCHP on 10/07/21. She tolerated cycle 2 much better with regular-dose (non-loading) herceptin and perjeta, reports only diarrhea. Physical exam shows her left breast mass is 1.5 x 1 cm with cycle 3, slightly  smaller at her last appointment with Dr. Burr Medico -Today (12/09/21), labs were reviewed. We will proceed with cycle 4 today as scheduled. -will monitor closely.    2. Symptom Management: Diarrhea, Weight loss, rash -secondary to chemo -she experiences diarrhea a few days after treatment.  She states that this is managed with Imodium. -her weight is overall stable at this time.  She reports decreased appetite a few days following infusion with associated weight loss but she gains it back on week 2 after infusion.  She is scheduled to see the nutritionist at her next appointment.  She drinks Ensure on the days that she has decreased appetite.   -She has a mild rash on her face which she was told is secondary to her treatment.  She describes these as "pimples"."  We will continue to monitor.  3. Osteoporosis -Her most recent DEXA was 05/14/20 showing T-score of -3.6.  -she reports being on Fosamax consistently for the past 10 years.   4. Genetics -she has no family history of breast cancer. She notes melanoma in her mother and a metastatic cancer (unknown primary) in a paternal uncle. -testing on 09/23/21 was negative, with VUS in TMEM127     PLAN:  -proceed with C4 TCHP today with GCSF on day 3  -lab, flush, f/u, and TCHP every 3 weeks as scheduled      No orders of the defined types were placed in this encounter.     The total time spent in the appointment was 20-29 minutes.   Katherine Donovan L Kissa Campoy, PA-C 12/09/21

## 2021-12-08 MED FILL — Dexamethasone Sodium Phosphate Inj 100 MG/10ML: INTRAMUSCULAR | Qty: 1 | Status: AC

## 2021-12-08 MED FILL — Fosaprepitant Dimeglumine For IV Infusion 150 MG (Base Eq): INTRAVENOUS | Qty: 5 | Status: AC

## 2021-12-09 ENCOUNTER — Inpatient Hospital Stay: Payer: Medicare Other

## 2021-12-09 ENCOUNTER — Other Ambulatory Visit: Payer: Self-pay

## 2021-12-09 ENCOUNTER — Encounter: Payer: Self-pay | Admitting: *Deleted

## 2021-12-09 ENCOUNTER — Inpatient Hospital Stay: Payer: Medicare Other | Attending: Hematology | Admitting: Physician Assistant

## 2021-12-09 VITALS — BP 99/54 | HR 71 | Temp 98.0°F | Resp 16

## 2021-12-09 VITALS — BP 146/77 | HR 79 | Temp 97.0°F | Resp 16 | Ht 62.0 in | Wt 122.4 lb

## 2021-12-09 DIAGNOSIS — Z170 Estrogen receptor positive status [ER+]: Secondary | ICD-10-CM

## 2021-12-09 DIAGNOSIS — R197 Diarrhea, unspecified: Secondary | ICD-10-CM | POA: Diagnosis not present

## 2021-12-09 DIAGNOSIS — R21 Rash and other nonspecific skin eruption: Secondary | ICD-10-CM | POA: Diagnosis not present

## 2021-12-09 DIAGNOSIS — Z79899 Other long term (current) drug therapy: Secondary | ICD-10-CM | POA: Insufficient documentation

## 2021-12-09 DIAGNOSIS — Z5189 Encounter for other specified aftercare: Secondary | ICD-10-CM | POA: Diagnosis not present

## 2021-12-09 DIAGNOSIS — K5900 Constipation, unspecified: Secondary | ICD-10-CM | POA: Insufficient documentation

## 2021-12-09 DIAGNOSIS — R5383 Other fatigue: Secondary | ICD-10-CM | POA: Insufficient documentation

## 2021-12-09 DIAGNOSIS — Z95828 Presence of other vascular implants and grafts: Secondary | ICD-10-CM

## 2021-12-09 DIAGNOSIS — Z5112 Encounter for antineoplastic immunotherapy: Secondary | ICD-10-CM | POA: Diagnosis present

## 2021-12-09 DIAGNOSIS — Z5111 Encounter for antineoplastic chemotherapy: Secondary | ICD-10-CM | POA: Insufficient documentation

## 2021-12-09 DIAGNOSIS — N6489 Other specified disorders of breast: Secondary | ICD-10-CM | POA: Diagnosis not present

## 2021-12-09 DIAGNOSIS — M810 Age-related osteoporosis without current pathological fracture: Secondary | ICD-10-CM | POA: Insufficient documentation

## 2021-12-09 DIAGNOSIS — G629 Polyneuropathy, unspecified: Secondary | ICD-10-CM | POA: Insufficient documentation

## 2021-12-09 DIAGNOSIS — R634 Abnormal weight loss: Secondary | ICD-10-CM | POA: Insufficient documentation

## 2021-12-09 DIAGNOSIS — Z808 Family history of malignant neoplasm of other organs or systems: Secondary | ICD-10-CM | POA: Insufficient documentation

## 2021-12-09 DIAGNOSIS — C50412 Malignant neoplasm of upper-outer quadrant of left female breast: Secondary | ICD-10-CM | POA: Diagnosis present

## 2021-12-09 DIAGNOSIS — Z7952 Long term (current) use of systemic steroids: Secondary | ICD-10-CM | POA: Insufficient documentation

## 2021-12-09 LAB — CBC WITH DIFFERENTIAL (CANCER CENTER ONLY)
Abs Immature Granulocytes: 0.06 10*3/uL (ref 0.00–0.07)
Basophils Absolute: 0 10*3/uL (ref 0.0–0.1)
Basophils Relative: 0 %
Eosinophils Absolute: 0 10*3/uL (ref 0.0–0.5)
Eosinophils Relative: 0 %
HCT: 35.4 % — ABNORMAL LOW (ref 36.0–46.0)
Hemoglobin: 11.7 g/dL — ABNORMAL LOW (ref 12.0–15.0)
Immature Granulocytes: 0 %
Lymphocytes Relative: 7 %
Lymphs Abs: 1 10*3/uL (ref 0.7–4.0)
MCH: 31.3 pg (ref 26.0–34.0)
MCHC: 33.1 g/dL (ref 30.0–36.0)
MCV: 94.7 fL (ref 80.0–100.0)
Monocytes Absolute: 1.5 10*3/uL — ABNORMAL HIGH (ref 0.1–1.0)
Monocytes Relative: 9 %
Neutro Abs: 13.3 10*3/uL — ABNORMAL HIGH (ref 1.7–7.7)
Neutrophils Relative %: 84 %
Platelet Count: 310 10*3/uL (ref 150–400)
RBC: 3.74 MIL/uL — ABNORMAL LOW (ref 3.87–5.11)
RDW: 14.9 % (ref 11.5–15.5)
WBC Count: 15.9 10*3/uL — ABNORMAL HIGH (ref 4.0–10.5)
nRBC: 0 % (ref 0.0–0.2)

## 2021-12-09 LAB — CMP (CANCER CENTER ONLY)
ALT: 15 U/L (ref 0–44)
AST: 22 U/L (ref 15–41)
Albumin: 4 g/dL (ref 3.5–5.0)
Alkaline Phosphatase: 60 U/L (ref 38–126)
Anion gap: 6 (ref 5–15)
BUN: 18 mg/dL (ref 8–23)
CO2: 26 mmol/L (ref 22–32)
Calcium: 10.1 mg/dL (ref 8.9–10.3)
Chloride: 108 mmol/L (ref 98–111)
Creatinine: 0.63 mg/dL (ref 0.44–1.00)
GFR, Estimated: >60 mL/min (ref >60)
Glucose, Bld: 101 mg/dL — ABNORMAL HIGH (ref 70–99)
Potassium: 4 mmol/L (ref 3.5–5.1)
Sodium: 140 mmol/L (ref 135–145)
Total Bilirubin: 0.4 mg/dL (ref 0.3–1.2)
Total Protein: 6.1 g/dL — ABNORMAL LOW (ref 6.5–8.1)

## 2021-12-09 MED ORDER — SODIUM CHLORIDE 0.9 % IV SOLN
150.0000 mg | Freq: Once | INTRAVENOUS | Status: AC
  Administered 2021-12-09: 150 mg via INTRAVENOUS
  Filled 2021-12-09: qty 150

## 2021-12-09 MED ORDER — TRASTUZUMAB-DKST CHEMO 150 MG IV SOLR
6.0000 mg/kg | Freq: Once | INTRAVENOUS | Status: AC
  Administered 2021-12-09: 336 mg via INTRAVENOUS
  Filled 2021-12-09: qty 16

## 2021-12-09 MED ORDER — SODIUM CHLORIDE 0.9 % IV SOLN
75.0000 mg/m2 | Freq: Once | INTRAVENOUS | Status: AC
  Administered 2021-12-09: 120 mg via INTRAVENOUS
  Filled 2021-12-09: qty 12

## 2021-12-09 MED ORDER — SODIUM CHLORIDE 0.9 % IV SOLN
420.0000 mg | Freq: Once | INTRAVENOUS | Status: AC
  Administered 2021-12-09: 420 mg via INTRAVENOUS
  Filled 2021-12-09: qty 14

## 2021-12-09 MED ORDER — SODIUM CHLORIDE 0.9% FLUSH
10.0000 mL | INTRAVENOUS | Status: DC | PRN
  Administered 2021-12-09: 10 mL

## 2021-12-09 MED ORDER — PALONOSETRON HCL INJECTION 0.25 MG/5ML
0.2500 mg | Freq: Once | INTRAVENOUS | Status: AC
  Administered 2021-12-09: 0.25 mg via INTRAVENOUS
  Filled 2021-12-09: qty 5

## 2021-12-09 MED ORDER — SODIUM CHLORIDE 0.9 % IV SOLN
10.0000 mg | Freq: Once | INTRAVENOUS | Status: AC
  Administered 2021-12-09: 10 mg via INTRAVENOUS
  Filled 2021-12-09: qty 10

## 2021-12-09 MED ORDER — SODIUM CHLORIDE 0.9 % IV SOLN: Freq: Once | INTRAVENOUS | Status: AC

## 2021-12-09 MED ORDER — DIPHENHYDRAMINE HCL 25 MG PO CAPS
50.0000 mg | ORAL_CAPSULE | Freq: Once | ORAL | Status: AC
  Administered 2021-12-09: 50 mg via ORAL
  Filled 2021-12-09: qty 2

## 2021-12-09 MED ORDER — SODIUM CHLORIDE 0.9 % IV SOLN
349.0000 mg | Freq: Once | INTRAVENOUS | Status: AC
  Administered 2021-12-09: 350 mg via INTRAVENOUS
  Filled 2021-12-09: qty 35

## 2021-12-09 MED ORDER — HEPARIN SOD (PORK) LOCK FLUSH 100 UNIT/ML IV SOLN
500.0000 [IU] | Freq: Once | INTRAVENOUS | Status: AC | PRN
  Administered 2021-12-09: 500 [IU]

## 2021-12-09 MED ORDER — ACETAMINOPHEN 325 MG PO TABS
650.0000 mg | ORAL_TABLET | Freq: Once | ORAL | Status: AC
  Administered 2021-12-09: 650 mg via ORAL
  Filled 2021-12-09: qty 2

## 2021-12-09 MED ORDER — SODIUM CHLORIDE 0.9% FLUSH
10.0000 mL | Freq: Once | INTRAVENOUS | Status: AC
  Administered 2021-12-09: 10 mL

## 2021-12-09 NOTE — Progress Notes (Signed)
0.9% Sodium Chloride, 276m Exp:05/05/23 LTMM:[id number] fluid for Perjeta  PAcquanetta Belling RPH, BCPS, BCOP 12/09/2021 1:21 PM

## 2021-12-09 NOTE — Patient Instructions (Signed)
[facility] ONCOLOGY  Discharge Instructions: Thank you for choosing [facility] to provide your oncology and hematology care.   If you have a lab appointment with the [facility], please go directly to the [facility] and check in at the registration area.   Wear comfortable clothing and clothing appropriate for easy access to any Portacath or PICC line.   We strive to give you quality time with your provider. You may need to reschedule your appointment if you arrive late (15 or more minutes).  Arriving late affects you and other patients whose appointments are after yours.  Also, if you miss three or more appointments without notifying the office, you may be dismissed from the clinic at the provider's discretion.      For prescription refill requests, have your pharmacy contact our office and allow 72 hours for refills to be completed.    Today you received the following chemotherapy and/or immunotherapy agents Herceptin, Perjeta, Taxotere, Carboplatin      To help prevent nausea and vomiting after your treatment, we encourage you to take your nausea medication as directed.  BELOW ARE SYMPTOMS THAT SHOULD BE REPORTED IMMEDIATELY: *FEVER GREATER THAN 100.4 F (38 C) OR HIGHER *CHILLS OR SWEATING *NAUSEA AND VOMITING THAT IS NOT CONTROLLED WITH YOUR NAUSEA MEDICATION *UNUSUAL SHORTNESS OF BREATH *UNUSUAL BRUISING OR BLEEDING *URINARY PROBLEMS (pain or burning when urinating, or frequent urination) *BOWEL PROBLEMS (unusual diarrhea, constipation, pain near the anus) TENDERNESS IN MOUTH AND THROAT WITH OR WITHOUT PRESENCE OF ULCERS (sore throat, sores in mouth, or a toothache) UNUSUAL RASH, SWELLING OR PAIN  UNUSUAL VAGINAL DISCHARGE OR ITCHING   Items with * indicate a potential emergency and should be followed up as soon as possible or go to the Emergency Department if any problems should occur.  Please show the CHEMOTHERAPY ALERT CARD or  IMMUNOTHERAPY ALERT CARD at check-in to the Emergency Department and triage nurse.  Should you have questions after your visit or need to cancel or reschedule your appointment, please contact [facility]  Dept: [phone]  and follow the prompts.  Office hours are 8:00 a.m. to 4:30 p.m. Monday - Friday. Please note that voicemails left after 4:00 p.m. may not be returned until the following business day.  We are closed weekends and major holidays. You have access to a nurse at all times for urgent questions. Please call the main number to the clinic Dept: [phone] and follow the prompts.   For any non-urgent questions, you may also contact your provider using MyChart. We now offer e-Visits for anyone 35 and older to request care online for non-urgent symptoms. For details visit [email or web address].   Also download the MyChart app! Go to the app store, search "MyChart", open the app, select [facility], and log in with your MyChart username and password.  Due to Covid, a mask is required upon entering the hospital/clinic. If you do not have a mask, one will be given to you upon arrival. For doctor visits, patients may have 1 support person aged 62 or older with them. For treatment visits, patients cannot have anyone with them due to current Covid guidelines and our immunocompromised population.

## 2021-12-11 ENCOUNTER — Inpatient Hospital Stay: Payer: Medicare Other

## 2021-12-11 ENCOUNTER — Other Ambulatory Visit: Payer: Self-pay

## 2021-12-11 VITALS — BP 153/84 | HR 66 | Temp 98.3°F | Resp 17

## 2021-12-11 DIAGNOSIS — C50412 Malignant neoplasm of upper-outer quadrant of left female breast: Secondary | ICD-10-CM

## 2021-12-11 MED ORDER — PEGFILGRASTIM-CBQV 6 MG/0.6ML ~~LOC~~ SOSY
6.0000 mg | PREFILLED_SYRINGE | Freq: Once | SUBCUTANEOUS | Status: AC
  Administered 2021-12-11: 6 mg via SUBCUTANEOUS
  Filled 2021-12-11: qty 0.6

## 2021-12-29 ENCOUNTER — Other Ambulatory Visit: Payer: Self-pay | Admitting: Hematology

## 2021-12-29 DIAGNOSIS — C50412 Malignant neoplasm of upper-outer quadrant of left female breast: Secondary | ICD-10-CM

## 2021-12-29 MED FILL — Dexamethasone Sodium Phosphate Inj 100 MG/10ML: INTRAMUSCULAR | Qty: 1 | Status: AC

## 2021-12-29 MED FILL — Fosaprepitant Dimeglumine For IV Infusion 150 MG (Base Eq): INTRAVENOUS | Qty: 5 | Status: AC

## 2021-12-30 ENCOUNTER — Inpatient Hospital Stay (HOSPITAL_BASED_OUTPATIENT_CLINIC_OR_DEPARTMENT_OTHER): Payer: Medicare Other | Admitting: Hematology

## 2021-12-30 ENCOUNTER — Other Ambulatory Visit: Payer: Self-pay

## 2021-12-30 ENCOUNTER — Inpatient Hospital Stay: Payer: Medicare Other | Admitting: Dietician

## 2021-12-30 ENCOUNTER — Encounter: Payer: Self-pay | Admitting: Hematology

## 2021-12-30 ENCOUNTER — Inpatient Hospital Stay: Payer: Medicare Other

## 2021-12-30 VITALS — BP 136/74 | HR 68 | Temp 98.3°F | Resp 16

## 2021-12-30 VITALS — BP 155/83 | HR 74 | Temp 98.2°F | Resp 18 | Ht 62.0 in | Wt 122.3 lb

## 2021-12-30 DIAGNOSIS — C50412 Malignant neoplasm of upper-outer quadrant of left female breast: Secondary | ICD-10-CM | POA: Diagnosis not present

## 2021-12-30 DIAGNOSIS — Z170 Estrogen receptor positive status [ER+]: Secondary | ICD-10-CM

## 2021-12-30 LAB — CMP (CANCER CENTER ONLY)
ALT: 14 U/L (ref 0–44)
AST: 22 U/L (ref 15–41)
Albumin: 4 g/dL (ref 3.5–5.0)
Alkaline Phosphatase: 61 U/L (ref 38–126)
Anion gap: 6 (ref 5–15)
BUN: 25 mg/dL — ABNORMAL HIGH (ref 8–23)
CO2: 28 mmol/L (ref 22–32)
Calcium: 10.2 mg/dL (ref 8.9–10.3)
Chloride: 107 mmol/L (ref 98–111)
Creatinine: 0.59 mg/dL (ref 0.44–1.00)
GFR, Estimated: >60 mL/min (ref >60)
Glucose, Bld: 91 mg/dL (ref 70–99)
Potassium: 4 mmol/L (ref 3.5–5.1)
Sodium: 141 mmol/L (ref 135–145)
Total Bilirubin: 0.4 mg/dL (ref 0.3–1.2)
Total Protein: 6.1 g/dL — ABNORMAL LOW (ref 6.5–8.1)

## 2021-12-30 LAB — CBC WITH DIFFERENTIAL (CANCER CENTER ONLY)
Abs Immature Granulocytes: 0.07 10*3/uL (ref 0.00–0.07)
Basophils Absolute: 0 10*3/uL (ref 0.0–0.1)
Basophils Relative: 0 %
Eosinophils Absolute: 0 10*3/uL (ref 0.0–0.5)
Eosinophils Relative: 0 %
HCT: 33.9 % — ABNORMAL LOW (ref 36.0–46.0)
Hemoglobin: 11.3 g/dL — ABNORMAL LOW (ref 12.0–15.0)
Immature Granulocytes: 0 %
Lymphocytes Relative: 8 %
Lymphs Abs: 1.4 10*3/uL (ref 0.7–4.0)
MCH: 31.5 pg (ref 26.0–34.0)
MCHC: 33.3 g/dL (ref 30.0–36.0)
MCV: 94.4 fL (ref 80.0–100.0)
Monocytes Absolute: 1.8 10*3/uL — ABNORMAL HIGH (ref 0.1–1.0)
Monocytes Relative: 10 %
Neutro Abs: 14.2 10*3/uL — ABNORMAL HIGH (ref 1.7–7.7)
Neutrophils Relative %: 82 %
Platelet Count: 299 10*3/uL (ref 150–400)
RBC: 3.59 MIL/uL — ABNORMAL LOW (ref 3.87–5.11)
RDW: 14.3 % (ref 11.5–15.5)
WBC Count: 17.5 10*3/uL — ABNORMAL HIGH (ref 4.0–10.5)
nRBC: 0 % (ref 0.0–0.2)

## 2021-12-30 MED ORDER — HEPARIN SOD (PORK) LOCK FLUSH 100 UNIT/ML IV SOLN
500.0000 [IU] | Freq: Once | INTRAVENOUS | Status: AC | PRN
  Administered 2021-12-30: 500 [IU]

## 2021-12-30 MED ORDER — SODIUM CHLORIDE 0.9 % IV SOLN
150.0000 mg | Freq: Once | INTRAVENOUS | Status: AC
  Administered 2021-12-30: 150 mg via INTRAVENOUS
  Filled 2021-12-30: qty 150

## 2021-12-30 MED ORDER — TRASTUZUMAB-DKST CHEMO 150 MG IV SOLR
6.0000 mg/kg | Freq: Once | INTRAVENOUS | Status: AC
  Administered 2021-12-30: 336 mg via INTRAVENOUS
  Filled 2021-12-30: qty 16

## 2021-12-30 MED ORDER — SODIUM CHLORIDE 0.9 % IV SOLN
350.0000 mg | Freq: Once | INTRAVENOUS | Status: AC
  Administered 2021-12-30: 350 mg via INTRAVENOUS
  Filled 2021-12-30: qty 35

## 2021-12-30 MED ORDER — PALONOSETRON HCL INJECTION 0.25 MG/5ML
0.2500 mg | Freq: Once | INTRAVENOUS | Status: AC
  Administered 2021-12-30: 0.25 mg via INTRAVENOUS
  Filled 2021-12-30: qty 5

## 2021-12-30 MED ORDER — SODIUM CHLORIDE 0.9 % IV SOLN: Freq: Once | INTRAVENOUS | Status: AC

## 2021-12-30 MED ORDER — SODIUM CHLORIDE 0.9 % IV SOLN
420.0000 mg | Freq: Once | INTRAVENOUS | Status: AC
  Administered 2021-12-30: 420 mg via INTRAVENOUS
  Filled 2021-12-30: qty 14

## 2021-12-30 MED ORDER — ACETAMINOPHEN 325 MG PO TABS
650.0000 mg | ORAL_TABLET | Freq: Once | ORAL | Status: AC
  Administered 2021-12-30: 650 mg via ORAL
  Filled 2021-12-30: qty 2

## 2021-12-30 MED ORDER — SODIUM CHLORIDE 0.9% FLUSH
10.0000 mL | Freq: Once | INTRAVENOUS | Status: AC
  Administered 2021-12-30: 10 mL

## 2021-12-30 MED ORDER — SODIUM CHLORIDE 0.9 % IV SOLN
75.0000 mg/m2 | Freq: Once | INTRAVENOUS | Status: AC
  Administered 2021-12-30: 120 mg via INTRAVENOUS
  Filled 2021-12-30: qty 12

## 2021-12-30 MED ORDER — DIPHENHYDRAMINE HCL 25 MG PO CAPS
50.0000 mg | ORAL_CAPSULE | Freq: Once | ORAL | Status: AC
  Administered 2021-12-30: 50 mg via ORAL
  Filled 2021-12-30: qty 2

## 2021-12-30 MED ORDER — SODIUM CHLORIDE 0.9% FLUSH
10.0000 mL | INTRAVENOUS | Status: DC | PRN
  Administered 2021-12-30: 10 mL

## 2021-12-30 MED ORDER — SODIUM CHLORIDE 0.9 % IV SOLN
10.0000 mg | Freq: Once | INTRAVENOUS | Status: AC
  Administered 2021-12-30: 10 mg via INTRAVENOUS
  Filled 2021-12-30: qty 10

## 2021-12-30 NOTE — Patient Instructions (Signed)
[facility] ONCOLOGY  Discharge Instructions: Thank you for choosing [facility] to provide your oncology and hematology care.   If you have a lab appointment with the [facility], please go directly to the [facility] and check in at the registration area.   Wear comfortable clothing and clothing appropriate for easy access to any Portacath or PICC line.   We strive to give you quality time with your provider. You may need to reschedule your appointment if you arrive late (15 or more minutes).  Arriving late affects you and other patients whose appointments are after yours.  Also, if you miss three or more appointments without notifying the office, you may be dismissed from the clinic at the provider's discretion.      For prescription refill requests, have your pharmacy contact our office and allow 72 hours for refills to be completed.    Today you received the following chemotherapy and/or immunotherapy agents: [place]/Perjeta/Docetaxel/Carboplatin      To help prevent nausea and vomiting after your treatment, we encourage you to take your nausea medication as directed.  BELOW ARE SYMPTOMS THAT SHOULD BE REPORTED IMMEDIATELY: *FEVER GREATER THAN 100.4 F (38 C) OR HIGHER *CHILLS OR SWEATING *NAUSEA AND VOMITING THAT IS NOT CONTROLLED WITH YOUR NAUSEA MEDICATION *UNUSUAL SHORTNESS OF BREATH *UNUSUAL BRUISING OR BLEEDING *URINARY PROBLEMS (pain or burning when urinating, or frequent urination) *BOWEL PROBLEMS (unusual diarrhea, constipation, pain near the anus) TENDERNESS IN MOUTH AND THROAT WITH OR WITHOUT PRESENCE OF ULCERS (sore throat, sores in mouth, or a toothache) UNUSUAL RASH, SWELLING OR PAIN  UNUSUAL VAGINAL DISCHARGE OR ITCHING   Items with * indicate a potential emergency and should be followed up as soon as possible or go to the Emergency Department if any problems should occur.  Please show the CHEMOTHERAPY ALERT CARD or IMMUNOTHERAPY  ALERT CARD at check-in to the Emergency Department and triage nurse.  Should you have questions after your visit or need to cancel or reschedule your appointment, please contact [facility]  Dept: [phone]  and follow the prompts.  Office hours are 8:00 a.m. to 4:30 p.m. Monday - Friday. Please note that voicemails left after 4:00 p.m. may not be returned until the following business day.  We are closed weekends and major holidays. You have access to a nurse at all times for urgent questions. Please call the main number to the clinic Dept: [phone] and follow the prompts.   For any non-urgent questions, you may also contact your provider using MyChart. We now offer e-Visits for anyone 93 and older to request care online for non-urgent symptoms. For details visit [email or web address].   Also download the MyChart app! Go to the app store, search "MyChart", open the app, select [facility], and log in with your MyChart username and password.  Masks are optional in the cancer centers. If you would like for your care team to wear a mask while they are taking care of you, please let them know. For doctor visits, patients may have with them one support person who is at least 74 years old. At this time, visitors are not allowed in the infusion area.

## 2021-12-30 NOTE — Progress Notes (Signed)
Nutrition Follow-up:  Patient with left breast cancer. She is receiving neoadjuvant TCHP q21d.   Met with patient in infusion. Patient reports appetite is not great for ~3 days following treatment. She does not feel like eating much. Patient recalls small amounts of eggs, cottage cheese, fruit. She reports appetite picks up and "hungry as a horse" by day 4. She is drinking lots of water as well as one Ensure Plus daily. Patient denies nausea, vomiting, constipation, diarrhea. Patient is requesting contact for genetics to inform of exposure to contaminated water at [facility].   Medications: reviewed  Labs: BUN 25  Anthropometrics: Weight 122 lb 4.8 oz today stable   6/7 - 122 lb 6.4 oz 5/16 - 122 lb 9.6 oz   NUTRITION DIAGNOSIS: Inadequate oral intake stable   INTERVENTION:  Continue drinking Ensure Plus/equivalent daily, suggested increasing to twice daily with pending surgery - coupons provided Continue activity as able  Message sent to genetic counselor with request to connect with patient    MONITORING, EVALUATION, GOAL: weight trends, intake   NEXT VISIT: To be scheduled as needed with treatment plan

## 2021-12-30 NOTE — Progress Notes (Addendum)
[place]   Telephone:([phone] Fax:([phone]) [phone]   Clinic Follow up Note   Patient Care Team: Nickola Major, MD as PCP - General (Family Medicine) Mauro Kaufmann, RN as Oncology Nurse Navigator Rockwell Germany, RN as Oncology Nurse Navigator [name], MD as Consulting Physician (General Surgery) [name], MD as Consulting Physician (Hematology) Kyung Rudd, MD as Consulting Physician (Radiation Oncology)  Date of Service:  12/30/2021  CHIEF COMPLAINT: f/u of left breast cancer  CURRENT THERAPY:  Neoadjuvant TCHP, q21d, starting 10/07/21  ASSESSMENT & PLAN:  Katherine Donovan is a 76 y.o. post-menopausal female with   1. Malignant neoplasm of upper-outer quadrant of left breast, ILC, Stage IA, c(T1c, N0), ER+/PR-/HER2+, Grade 3  -found on screening mammogram. B/l MM and [place] on 09/01/21 showed a 2.9 cm left breast mass. Biopsy on 09/14/21 showed invasive lobular carcinoma, grade 2-3. -breast MRI on 09/21/21 showed 3 cm UOQ left breast malignancy, negative for additional malignancy or adenopathy. -baseline echo on 09/29/21 showed EF of 60-65%, normal. We will monitor every 3 months. -for reference, left breast mass measured 3 cm on physical exam the first day of chemo. -she began neoadjuvant TCHP on 10/07/21. She tolerated cycle 2 much better with regular-dose (non-loading) herceptin and perjeta, reports only diarrhea. Physical exam prior to cycle 3 showed her left breast mass was 1.5 x 1 cm. Stable today (12/30/21). -I expressed concern that her breast mass is not shrinking much since she started chemo. I discussed obtaining MRI earlier than planned and possibly skipping last cycle treatment depending on results. She is agreeable with whatever I recommend. I ordered MRI to be done before next cycle. -labs reviewed, overall stable on chemo, hgb slightly lower at 11.3 today. Will proceed cycle 5 chemo today    2. Symptom Management: Diarrhea, Weight loss,  facial rosacea  -secondary to chemo -diarrhea is managed with Imodium. -her weight is stable. She reports decreased appetite a few days following infusion with associated weight loss but she gains it back on week 2 after infusion. She drinks Ensure on the days that she has decreased appetite.   -She gets a mild rash on her face following infusion   3. Osteoporosis -Her most recent DEXA was 05/14/20 showing T-score of -3.6.  -she reports being on Fosamax consistently for the past 10 years.   4. Genetics -she has no family history of breast cancer. She notes melanoma in her mother and a metastatic cancer (unknown primary) in a paternal uncle. -testing on 09/23/21 was negative, with VUS in TMEM127     PLAN:  -proceed with C5 TCHP today with GCSF on day 3  -breast MRI to be done before next cycle -lab, flush, f/u, and TCHP in 3 weeks, depends on her breast MRI result, will decide if skip chemo or not (will continue HP) on next cycle -will copy Dr. Donne Hazel    No problem-specific Assessment & Plan notes found for this encounter.   SUMMARY OF ONCOLOGIC HISTORY: Oncology History Overview Note   Cancer Staging  Malignant neoplasm of upper-outer quadrant of left breast in female, estrogen receptor positive ([place]) Staging form: Breast, AJCC 8th Edition - Clinical stage from 09/14/2021: Stage IA (cT1c, cN0, cM0, G3, ER+, PR-, HER2+) - Signed by [name], MD on 09/22/2021    Malignant neoplasm of upper-outer quadrant of left breast in female, estrogen receptor positive ([place])  09/01/2021 Mammogram   CLINICAL DATA:  Bilateral screening recall for a right breast asymmetry  and left breast mass.   EXAM: DIGITAL DIAGNOSTIC BILATERAL MAMMOGRAM WITH TOMOSYNTHESIS AND CAD; ULTRASOUND LEFT BREAST LIMITED; ULTRASOUND RIGHT BREAST LIMITED  IMPRESSION: 1. There is a highly suspicious ill-defined mass in the upper-outer left breast. The mass measures at least 2.9 cm, and possibly up to 3.8 cm.  Accurate measurement is difficult due to its ill-defined insinuating appearance.   2.  No evidence of left axillary lymphadenopathy.   3. No persistent suspicious mammographic or targeted sonographic abnormalities in the right breast.   09/14/2021 Cancer Staging   Staging form: Breast, AJCC 8th Edition - Clinical stage from 09/14/2021: Stage IA (cT1c, cN0, cM0, G3, ER+, PR-, HER2+) - Signed by [name], MD on 09/22/2021 Stage prefix: Initial diagnosis Histologic grading system: 3 grade system   09/14/2021 Initial Biopsy   Diagnosis Breast, left, needle core biopsy, 1 o'clock, 4cmfn - INVASIVE MAMMARY CARCINOMA. SEE NOTE Diagnosis Note Carcinoma measures 1.3 cm in greatest linear dimension and appears grade 2-3.  Immunohistochemical stain for E-cadherin is negative, consistent with a lobular phenotype.  PROGNOSTIC INDICATORS Results: The tumor cells are POSITIVE for Her2 (3+). Estrogen Receptor: 50%, POSITIVE, MODERATE-WEAK STAINING INTENSITY Progesterone Receptor: 0%, NEGATIVE Proliferation Marker Ki67: 15%   09/18/2021 Initial Diagnosis   Malignant neoplasm of upper-outer quadrant of left breast in female, estrogen receptor positive ([place])   09/21/2021 Imaging   EXAM: BILATERAL BREAST MRI WITH AND WITHOUT CONTRAST  IMPRESSION: 1. 3 cm biopsy-proven malignancy within the UPPER-OUTER LEFT breast. No evidence of multifocal, multicentric or contralateral malignancy. No abnormal lymph nodes.   10/07/2021 -  Chemotherapy   Patient is on Treatment Plan : BREAST  Docetaxel + Carboplatin + Trastuzumab + Pertuzumab  (TCHP) q21d      10/16/2021 Genetic Testing   Negative genetic testing: no pathogenic variants detected in [facility] CancerNext-Expanded +RNAinsight Panel.  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 Report date is October 16, 2021.   The CancerNext-Expanded gene panel offered by [facility] and includes sequencing, rearrangement, and RNA analysis for  the following 77 genes: AIP, ALK, APC, ATM, AXIN2, BAP1, BARD1, BLM, BMPR1A, BRCA1, BRCA2, BRIP1, CDC73, CDH1, CDK4, CDKN1B, CDKN2A, CHEK2, CTNNA1, DICER1, FANCC, FH, FLCN, GALNT12, KIF1B, LZTR1, MAX, MEN1, MET, MLH1, MSH2, MSH3, MSH6, MUTYH, NBN, NF1, NF2, NTHL1, PALB2, PHOX2B, PMS2, POT1, PRKAR1A, PTCH1, PTEN, RAD51C, RAD51D, RB1, RECQL, RET, SDHA, SDHAF2, SDHB, SDHC, SDHD, SMAD4, SMARCA4, SMARCB1, SMARCE1, STK11, SUFU, TMEM127, TP53, TSC1, TSC2, VHL and XRCC2 (sequencing and deletion/duplication); EGFR, EGLN1, HOXB13, KIT, MITF, PDGFRA, POLD1, and POLE (sequencing only); EPCAM and GREM1 (deletion/duplication only).      INTERVAL HISTORY:  ZYLIE MUMAW is here for a follow up of breast cancer. She was last seen by PA Cassie on 12/09/21. She presents to the clinic alone. She reports she continues to do well on chemo, her main concern is her weight; she worries she is not gaining weight. She notes she experiences some rosacea, which she had experienced prior to chemo, and eye watering   All other systems were reviewed with the patient and are negative.  MEDICAL HISTORY:  Past Medical History:  Diagnosis Date   History of bunionectomy 09/02/2020   Hyperlipidemia    Hyperlipidemia    left breast ILC 09/15/21   Osteoporosis    Rosacea    to face    SURGICAL HISTORY: Past Surgical History:  Procedure Laterality Date   PORTACATH PLACEMENT Right 10/06/2021   Procedure: INSERTION PORT-A-CATH;  Surgeon: [name], MD;  Location: [facility]  [place];  Service: General;  Laterality: Right;   SIGMOIDOSCOPY  1999   TUBAL LIGATION      I have reviewed the social history and family history with the patient and they are unchanged from previous note.  ALLERGIES:  has No Known Allergies.  MEDICATIONS:  Current Outpatient Medications  Medication Sig Dispense Refill   alendronate (FOSAMAX) 70 MG tablet Take 70 mg by mouth once a week. Take with a full glass of water on an empty  stomach.     Calcium Carbonate-Vitamin D 600-400 MG-UNIT tablet Take 1 tablet by mouth daily.       CRESTOR 10 MG tablet Take 5 mg by mouth Daily.     dexamethasone (DECADRON) 4 MG tablet Take 1 tablet (4 mg total) by mouth daily. Take 1 tab twice daily the day before Taxotere. Then take 1 tab daily x 3 days after chemotherapy. 30 tablet 0   diphenoxylate-atropine (LOMOTIL) 2.5-0.025 MG tablet Take 2 tablets by mouth 4 (four) times daily as needed for diarrhea or loose stools (Maximum dose = 8 tablets/day). Maximum dose = 8 tablets/day 30 tablet 0   Glucosamine 750 MG TABS Take 750 mg by mouth daily.       hydrocortisone 2.5 % cream Apply 1 application. topically as needed.     Lactobacillus Rhamnosus, GG, (RA PROBIOTIC DIGESTIVE CARE) CAPS Take 1 capsule by mouth 3 (three) times a week.     lidocaine-prilocaine (EMLA) cream Apply to affected area once 30 g 3   metroNIDAZOLE (METROGEL) 0.75 % gel Apply 1 application. topically 2 (two) times daily.     Misc Natural Products (JOINT SUPPORT COMPLEX PO) Take by mouth daily. [place] Joints-Cartilage-Bone Triple Action Joint Health; Undenatured Type II Collagen     Multiple Vitamin (MULTI-VITAMIN) tablet Take 1 tablet by mouth daily.     ondansetron (ZOFRAN) 8 MG tablet Take 1 tablet (8 mg total) by mouth 2 (two) times daily as needed (Nausea or vomiting). Start on the third day after chemotherapy. 30 tablet 1   prochlorperazine (COMPAZINE) 10 MG tablet Take 1 tablet (10 mg total) by mouth every 6 (six) hours as needed (Nausea or vomiting). 30 tablet 1   No current facility-administered medications for this visit.   Facility-Administered Medications Ordered in Other Visits  Medication Dose Route Frequency Provider Last Rate Last Admin   CARBOplatin (PARAPLATIN) 350 mg in sodium chloride 0.9 % 250 mL chemo infusion  350 mg Intravenous Once [name], MD       DOCEtaxel (TAXOTERE) 120 mg in sodium chloride 0.9 % 250 mL chemo infusion  75 mg/m2  (Treatment Plan Recorded) Intravenous Once [name], MD       heparin lock flush 100 unit/mL  500 Units Intracatheter Once PRN [name], MD       sodium chloride flush (NS) 0.9 % injection 10 mL  10 mL Intracatheter PRN [name], MD        PHYSICAL EXAMINATION: ECOG PERFORMANCE STATUS: 1 - Symptomatic but completely ambulatory  Vitals:   12/30/21 0949  BP: (!) 155/83  Pulse: 74  Resp: 18  Temp: 98.2 F (36.8 C)  SpO2: 99%   Wt Readings from Last 3 Encounters:  12/30/21 122 lb 4.8 oz (55.5 kg)  12/09/21 122 lb 6.4 oz (55.5 kg)  11/17/21 122 lb 9.6 oz (55.6 kg)     GENERAL:alert, no distress and comfortable SKIN: skin color, texture, turgor are normal, no rashes or significant lesions EYES: normal, Conjunctiva  are pink and non-injected, sclera clear  NECK: supple, thyroid normal size, non-tender, without nodularity LYMPH:  no palpable lymphadenopathy in the cervical, axillary ABDOMEN:abdomen soft, non-tender and normal bowel sounds Musculoskeletal:no cyanosis of digits and no clubbing  NEURO: alert & oriented x 3 with fluent speech, no focal motor/sensory deficits BREAST: 1.5 cm mass in left breast at 2 o'clock. No other palpable mass or adenopathy   LABORATORY DATA:  I have reviewed the data as listed    Latest Ref Rng & Units 12/30/2021    9:17 AM 12/09/2021    8:56 AM 11/17/2021    9:28 AM  CBC  WBC 4.0 - 10.5 K/uL 17.5  15.9  13.8   Hemoglobin 12.0 - 15.0 g/dL 11.3  11.7  12.2   Hematocrit 36.0 - 46.0 % 33.9  35.4  36.3   Platelets 150 - 400 K/uL 299  310  348         Latest Ref Rng & Units 12/30/2021    9:17 AM 12/09/2021    8:56 AM 11/17/2021    9:28 AM  CMP  Glucose 70 - 99 mg/dL 91  101  92   BUN 8 - 23 mg/dL _0 Creatinine 0.44 - 1.00 mg/dL 0.59  0.63  0.66   Sodium 135 - 145 mmol/L 141  140  141   Potassium 3.5 - 5.1 mmol/L 4.0  4.0  3.9   Chloride 98 - 111 mmol/L 107  108  106   CO2 22 - 32 mmol/L _1 Calcium 8.9 - 10.3 mg/dL 10.2   10.1  10.1   Total Protein 6.5 - 8.1 g/dL 6.1  6.1  6.3   Total Bilirubin 0.3 - 1.2 mg/dL 0.4  0.4  0.5   Alkaline Phos 38 - 126 U/L 61  60  61   AST 15 - 41 U/L _2 ALT 0 - 44 U/L _3 RADIOGRAPHIC STUDIES: I have personally reviewed the radiological images as listed and agreed with the findings in the report. No results found.    Orders Placed This Encounter  Procedures   MR BREAST BILATERAL W WO CONTRAST INC CAD    Standing Status:   Future    Standing Expiration Date:   12/31/2022    Order Specific Question:   If indicated for the ordered procedure, I authorize the administration of contrast media per Radiology protocol    Answer:   Yes    Order Specific Question:   What is the patient's sedation requirement?    Answer:   No Sedation    Order Specific Question:   Does the patient have a pacemaker or implanted devices?    Answer:   No    Order Specific Question:   Radiology Contrast Protocol - do NOT remove file path    Answer:   [email or web address]    Order Specific Question:   Preferred imaging location?    Answer:   [facility] (table limit-550lbs)   All questions were answered. The patient knows to call the clinic with any problems, questions or concerns. No barriers to learning was detected. The total time spent in the appointment was 30 minutes.     [name], MD 12/30/2021   I, Wilburn Mylar, am acting as scribe for [name], MD.   I have reviewed the above documentation for accuracy  and completeness, and I agree with the above.

## 2021-12-31 ENCOUNTER — Telehealth: Payer: Self-pay | Admitting: Genetic Counselor

## 2021-12-31 ENCOUNTER — Encounter: Payer: Self-pay | Admitting: *Deleted

## 2021-12-31 LAB — CANCER ANTIGEN 27.29: CA 27.29: 33.4 U/mL (ref 0.0–38.6)

## 2021-12-31 NOTE — Telephone Encounter (Signed)
Patient reported [employer] exposure in [place] and [place].  Updated genetic counseling note to reflect this exposure.

## 2022-01-01 ENCOUNTER — Inpatient Hospital Stay: Payer: Medicare Other

## 2022-01-01 ENCOUNTER — Other Ambulatory Visit: Payer: Self-pay

## 2022-01-01 VITALS — BP 118/63 | HR 87 | Temp 98.4°F | Resp 16

## 2022-01-01 DIAGNOSIS — C50412 Malignant neoplasm of upper-outer quadrant of left female breast: Secondary | ICD-10-CM

## 2022-01-01 MED ORDER — PEGFILGRASTIM-CBQV 6 MG/0.6ML ~~LOC~~ SOSY
6.0000 mg | PREFILLED_SYRINGE | Freq: Once | SUBCUTANEOUS | Status: AC
  Administered 2022-01-01: 6 mg via SUBCUTANEOUS
  Filled 2022-01-01: qty 0.6

## 2022-01-13 ENCOUNTER — Ambulatory Visit
Admission: RE | Admit: 2022-01-13 | Discharge: 2022-01-13 | Disposition: A | Discharge status: Home / Self Care | Payer: Medicare Other | Source: Ambulatory Visit | Attending: Hematology | Admitting: Hematology

## 2022-01-13 DIAGNOSIS — C50412 Malignant neoplasm of upper-outer quadrant of left female breast: Secondary | ICD-10-CM

## 2022-01-13 MED ORDER — GADOBUTROL 1 MMOL/ML IV SOLN
6.0000 mL | Freq: Once | INTRAVENOUS | Status: AC | PRN
  Administered 2022-01-13: 6 mL via INTRAVENOUS

## 2022-01-14 ENCOUNTER — Encounter: Payer: Self-pay | Admitting: *Deleted

## 2022-01-15 ENCOUNTER — Other Ambulatory Visit: Payer: Self-pay | Admitting: General Surgery

## 2022-01-15 DIAGNOSIS — C50412 Malignant neoplasm of upper-outer quadrant of left female breast: Secondary | ICD-10-CM

## 2022-01-19 ENCOUNTER — Encounter: Payer: Self-pay | Admitting: *Deleted

## 2022-01-19 ENCOUNTER — Other Ambulatory Visit: Payer: Self-pay | Admitting: General Surgery

## 2022-01-19 DIAGNOSIS — Z170 Estrogen receptor positive status [ER+]: Secondary | ICD-10-CM

## 2022-01-19 MED FILL — Dexamethasone Sodium Phosphate Inj 100 MG/10ML: INTRAMUSCULAR | Qty: 1 | Status: AC

## 2022-01-19 MED FILL — Fosaprepitant Dimeglumine For IV Infusion 150 MG (Base Eq): INTRAVENOUS | Qty: 5 | Status: AC

## 2022-01-20 ENCOUNTER — Inpatient Hospital Stay: Payer: Medicare Other | Attending: Hematology

## 2022-01-20 ENCOUNTER — Inpatient Hospital Stay: Payer: Medicare Other

## 2022-01-20 ENCOUNTER — Other Ambulatory Visit: Payer: Self-pay

## 2022-01-20 ENCOUNTER — Inpatient Hospital Stay (HOSPITAL_BASED_OUTPATIENT_CLINIC_OR_DEPARTMENT_OTHER): Payer: Medicare Other | Admitting: Hematology

## 2022-01-20 ENCOUNTER — Telehealth: Payer: Self-pay | Admitting: Radiation Oncology

## 2022-01-20 ENCOUNTER — Encounter: Payer: Self-pay | Admitting: Hematology

## 2022-01-20 VITALS — BP 142/83 | HR 84 | Temp 98.4°F | Resp 18 | Ht 62.0 in | Wt 121.5 lb

## 2022-01-20 DIAGNOSIS — M810 Age-related osteoporosis without current pathological fracture: Secondary | ICD-10-CM | POA: Insufficient documentation

## 2022-01-20 DIAGNOSIS — R197 Diarrhea, unspecified: Secondary | ICD-10-CM | POA: Diagnosis not present

## 2022-01-20 DIAGNOSIS — C50412 Malignant neoplasm of upper-outer quadrant of left female breast: Secondary | ICD-10-CM

## 2022-01-20 DIAGNOSIS — Z170 Estrogen receptor positive status [ER+]: Secondary | ICD-10-CM | POA: Insufficient documentation

## 2022-01-20 DIAGNOSIS — Z808 Family history of malignant neoplasm of other organs or systems: Secondary | ICD-10-CM | POA: Insufficient documentation

## 2022-01-20 DIAGNOSIS — R633 Feeding difficulties, unspecified: Secondary | ICD-10-CM | POA: Diagnosis not present

## 2022-01-20 DIAGNOSIS — Z79899 Other long term (current) drug therapy: Secondary | ICD-10-CM | POA: Diagnosis not present

## 2022-01-20 DIAGNOSIS — R634 Abnormal weight loss: Secondary | ICD-10-CM | POA: Diagnosis not present

## 2022-01-20 DIAGNOSIS — N6311 Unspecified lump in the right breast, upper outer quadrant: Secondary | ICD-10-CM | POA: Insufficient documentation

## 2022-01-20 DIAGNOSIS — I5020 Unspecified systolic (congestive) heart failure: Secondary | ICD-10-CM | POA: Diagnosis not present

## 2022-01-20 DIAGNOSIS — Z95828 Presence of other vascular implants and grafts: Secondary | ICD-10-CM

## 2022-01-20 DIAGNOSIS — Z5112 Encounter for antineoplastic immunotherapy: Secondary | ICD-10-CM | POA: Diagnosis present

## 2022-01-20 DIAGNOSIS — G629 Polyneuropathy, unspecified: Secondary | ICD-10-CM | POA: Diagnosis not present

## 2022-01-20 DIAGNOSIS — N6489 Other specified disorders of breast: Secondary | ICD-10-CM | POA: Insufficient documentation

## 2022-01-20 LAB — CBC WITH DIFFERENTIAL (CANCER CENTER ONLY)
Abs Immature Granulocytes: 0.04 10*3/uL (ref 0.00–0.07)
Basophils Absolute: 0 10*3/uL (ref 0.0–0.1)
Basophils Relative: 0 %
Eosinophils Absolute: 0 10*3/uL (ref 0.0–0.5)
Eosinophils Relative: 0 %
HCT: 34.6 % — ABNORMAL LOW (ref 36.0–46.0)
Hemoglobin: 11.4 g/dL — ABNORMAL LOW (ref 12.0–15.0)
Immature Granulocytes: 0 %
Lymphocytes Relative: 9 %
Lymphs Abs: 0.9 10*3/uL (ref 0.7–4.0)
MCH: 31.7 pg (ref 26.0–34.0)
MCHC: 32.9 g/dL (ref 30.0–36.0)
MCV: 96.1 fL (ref 80.0–100.0)
Monocytes Absolute: 0.9 10*3/uL (ref 0.1–1.0)
Monocytes Relative: 10 %
Neutro Abs: 7.8 10*3/uL — ABNORMAL HIGH (ref 1.7–7.7)
Neutrophils Relative %: 81 %
Platelet Count: 245 10*3/uL (ref 150–400)
RBC: 3.6 MIL/uL — ABNORMAL LOW (ref 3.87–5.11)
RDW: 13.9 % (ref 11.5–15.5)
WBC Count: 9.7 10*3/uL (ref 4.0–10.5)
nRBC: 0 % (ref 0.0–0.2)

## 2022-01-20 LAB — CMP (CANCER CENTER ONLY)
ALT: 15 U/L (ref 0–44)
AST: 24 U/L (ref 15–41)
Albumin: 3.8 g/dL (ref 3.5–5.0)
Alkaline Phosphatase: 53 U/L (ref 38–126)
Anion gap: 4 — ABNORMAL LOW (ref 5–15)
BUN: 17 mg/dL (ref 8–23)
CO2: 28 mmol/L (ref 22–32)
Calcium: 9.8 mg/dL (ref 8.9–10.3)
Chloride: 108 mmol/L (ref 98–111)
Creatinine: 0.56 mg/dL (ref 0.44–1.00)
GFR, Estimated: >60 mL/min (ref >60)
Glucose, Bld: 94 mg/dL (ref 70–99)
Potassium: 4.3 mmol/L (ref 3.5–5.1)
Sodium: 140 mmol/L (ref 135–145)
Total Bilirubin: 0.3 mg/dL (ref 0.3–1.2)
Total Protein: 5.7 g/dL — ABNORMAL LOW (ref 6.5–8.1)

## 2022-01-20 MED ORDER — SODIUM CHLORIDE 0.9% FLUSH
10.0000 mL | INTRAVENOUS | Status: DC | PRN
  Administered 2022-01-20: 10 mL

## 2022-01-20 MED ORDER — SODIUM CHLORIDE 0.9 % IV SOLN: Freq: Once | INTRAVENOUS | Status: AC

## 2022-01-20 MED ORDER — TRASTUZUMAB-DKST CHEMO 150 MG IV SOLR
6.0000 mg/kg | Freq: Once | INTRAVENOUS | Status: AC
  Administered 2022-01-20: 336 mg via INTRAVENOUS
  Filled 2022-01-20: qty 16

## 2022-01-20 MED ORDER — ACETAMINOPHEN 325 MG PO TABS
650.0000 mg | ORAL_TABLET | Freq: Once | ORAL | Status: AC
  Administered 2022-01-20: 650 mg via ORAL
  Filled 2022-01-20: qty 2

## 2022-01-20 MED ORDER — SODIUM CHLORIDE 0.9% FLUSH
10.0000 mL | Freq: Once | INTRAVENOUS | Status: AC
  Administered 2022-01-20: 10 mL

## 2022-01-20 MED ORDER — SODIUM CHLORIDE 0.9 % IV SOLN
420.0000 mg | Freq: Once | INTRAVENOUS | Status: AC
  Administered 2022-01-20: 420 mg via INTRAVENOUS
  Filled 2022-01-20: qty 14

## 2022-01-20 MED ORDER — HEPARIN SOD (PORK) LOCK FLUSH 100 UNIT/ML IV SOLN
500.0000 [IU] | Freq: Once | INTRAVENOUS | Status: AC | PRN
  Administered 2022-01-20: 500 [IU]

## 2022-01-20 MED ORDER — DIPHENHYDRAMINE HCL 25 MG PO CAPS
25.0000 mg | ORAL_CAPSULE | Freq: Once | ORAL | Status: AC
  Administered 2022-01-20: 25 mg via ORAL
  Filled 2022-01-20: qty 1

## 2022-01-20 NOTE — Patient Instructions (Signed)
[facility]  Discharge Instructions: Thank you for choosing [facility] to provide your oncology and hematology care.   If you have a lab appointment with the [facility], please go directly to the [facility] and check in at the registration area.   Wear comfortable clothing and clothing appropriate for easy access to any Portacath or PICC line.   We strive to give you quality time with your provider. You may need to reschedule your appointment if you arrive late (15 or more minutes).  Arriving late affects you and other patients whose appointments are after yours.  Also, if you miss three or more appointments without notifying the office, you may be dismissed from the clinic at the provider's discretion.      For prescription refill requests, have your pharmacy contact our office and allow 72 hours for refills to be completed.    Today you received the following chemotherapy and/or immunotherapy agents: trastuzumab, pertuzumab      To help prevent nausea and vomiting after your treatment, we encourage you to take your nausea medication as directed.  BELOW ARE SYMPTOMS THAT SHOULD BE REPORTED IMMEDIATELY: *FEVER GREATER THAN 100.4 F (38 C) OR HIGHER *CHILLS OR SWEATING *NAUSEA AND VOMITING THAT IS NOT CONTROLLED WITH YOUR NAUSEA MEDICATION *UNUSUAL SHORTNESS OF BREATH *UNUSUAL BRUISING OR BLEEDING *URINARY PROBLEMS (pain or burning when urinating, or frequent urination) *BOWEL PROBLEMS (unusual diarrhea, constipation, pain near the anus) TENDERNESS IN MOUTH AND THROAT WITH OR WITHOUT PRESENCE OF ULCERS (sore throat, sores in mouth, or a toothache) UNUSUAL RASH, SWELLING OR PAIN  UNUSUAL VAGINAL DISCHARGE OR ITCHING   Items with * indicate a potential emergency and should be followed up as soon as possible or go to the Emergency Department if any problems should occur.  Please show the CHEMOTHERAPY ALERT CARD or IMMUNOTHERAPY ALERT CARD  at check-in to the Emergency Department and triage nurse.  Should you have questions after your visit or need to cancel or reschedule your appointment, please contact [facility]  Dept: [phone]  and follow the prompts.  Office hours are 8:00 a.m. to 4:30 p.m. Monday - Friday. Please note that voicemails left after 4:00 p.m. may not be returned until the following business day.  We are closed weekends and major holidays. You have access to a nurse at all times for urgent questions. Please call the main number to the clinic Dept: [phone] and follow the prompts.   For any non-urgent questions, you may also contact your provider using MyChart. We now offer e-Visits for anyone 39 and older to request care online for non-urgent symptoms. For details visit [email or web address].   Also download the MyChart app! Go to the app store, search "MyChart", open the app, select [facility], and log in with your MyChart username and password.  Masks are optional in the cancer centers. If you would like for your care team to wear a mask while they are taking care of you, please let them know. For doctor visits, patients may have with them one support person who is at least 74 years old. At this time, visitors are not allowed in the infusion area.

## 2022-01-20 NOTE — Progress Notes (Addendum)
Katherine Donovan   [phone] Fax:([phone]) [phone]   Clinic Follow up Note   Patient Care Team: Katherine Major, MD as PCP - General (Family Medicine) Katherine Kaufmann, RN as Oncology Nurse Navigator Katherine Germany, RN as Oncology Nurse Navigator Katherine Bookbinder, MD as Consulting Physician (General Surgery) Katherine Merle, MD as Consulting Physician (Hematology) Katherine Rudd, MD as Consulting Physician (Radiation Oncology)  Date of Service:  01/20/2022  CHIEF COMPLAINT: f/u of left breast cancer  CURRENT THERAPY:  Neoadjuvant TCHP, starting 10/07/21  -moving to maintenance HP today, 01/20/22  ASSESSMENT & PLAN:  Katherine Donovan is a 76 y.o. post-menopausal female with   1. Malignant neoplasm of upper-outer quadrant of left breast, ILC, Stage IA, c(T1c, N0), ER+/PR-/HER2+, Grade 3  -found on screening mammogram. B/l MM and [place] on 09/01/21 showed a 2.9 cm left breast mass. Biopsy on 09/14/21 showed invasive lobular carcinoma, grade 2-3. -breast MRI on 09/21/21 showed 3 cm UOQ left breast malignancy, negative for additional malignancy or adenopathy. -baseline echo on 09/29/21 showed EF of 60-65%, normal. We will monitor every 3 months. -for reference, left breast mass measured 3 cm on physical exam the first day of chemo. -she began neoadjuvant TCHP on 10/07/21. She tolerated cycle 2 much better with regular-dose (non-loading) herceptin and perjeta, reports only diarrhea. Physical exam prior to cycle 3 showed her left breast mass was 1.5 x 1 cm. Stable on 12/30/21. -she underwent breast MRI on 01/13/22 showing complete imaging response. -she reports she tolerated cycle 5 poorly, with neuropathy to her fingers and numbness to her toes, diarrhea, and low appetite. We discussed whether to proceed with or skip her final cycle TCHP. Given poor tolerance, and complete response on recent breast MRI, I recommend proceeding with herceptin/perjeta alone today.  Patient would like to stop  chemo early also. -labs reviewed, overall stable on chemo, hgb stable at 11.4 today.  -she is scheduled for left lumpectomy on 02/04/22 with Dr. Donne Donovan. -We briefly discussed adjuvant treatment.  If she has residual cancer on surgical pathology, I may switch her to [place].  If she did have a pathological complete response, then she will complete maintenance trastuzumab for a total of 1 year. -She would benefit from adjuvant radiation and antiestrogen therapy.  She is agreeable.   2. 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 -her weight is stable. She reports decreased appetite a few days following infusion with associated weight loss but she gains it back on week 2 after infusion. She drinks Ensure on the days that she has decreased appetite.   -s/p C5, she has developed persistent numbness and tingling in her fingers. Given good response on MRI, we will hold last carbo/taxol today.   3. Osteoporosis -Her most recent DEXA was 05/14/20 showing T-score of -3.6.  -she reports being on Fosamax consistently for the past 10 years.   4. Genetics -she has no family history of breast cancer. She notes melanoma in her mother and a metastatic cancer (unknown primary) in a paternal uncle. -testing on 09/23/21 was negative, with VUS in Katherine Donovan:  -proceed with HP alone today, and for both the last cycle of chemotherapy, due to her poor tolerance to her last cycle chemo, and complete radiographic response on recent breast MRI. -left lumpectomy on 8/3 with Dr. Donne Donovan -lab, flush, f/u, and HP in 3 weeks -echo in next few weeks    No  problem-specific Assessment & Plan notes found for this encounter.   SUMMARY OF ONCOLOGIC HISTORY: Oncology History Overview Note   Cancer Staging  Malignant neoplasm of upper-outer quadrant of left breast in female, estrogen receptor positive (Katherine Donovan) Staging form: Breast, AJCC 8th  Edition - Clinical stage from 09/14/2021: Stage IA (cT1c, cN0, cM0, G3, ER+, PR-, HER2+) - Signed by Katherine Merle, MD on 09/22/2021    Malignant neoplasm of upper-outer quadrant of left breast in female, estrogen receptor positive (Katherine Donovan)  09/01/2021 Mammogram   CLINICAL DATA:  Bilateral screening recall for a right breast asymmetry and left breast mass.   EXAM: DIGITAL DIAGNOSTIC BILATERAL MAMMOGRAM WITH TOMOSYNTHESIS AND CAD; ULTRASOUND LEFT BREAST LIMITED; ULTRASOUND RIGHT BREAST LIMITED  IMPRESSION: 1. There is a highly suspicious ill-defined mass in the upper-outer left breast. The mass measures at least 2.9 cm, and possibly up to 3.8 cm. Accurate measurement is difficult due to its ill-defined insinuating appearance.   2.  No evidence of left axillary lymphadenopathy.   3. No persistent suspicious mammographic or targeted sonographic abnormalities in the right breast.   09/14/2021 Cancer Staging   Staging form: Breast, AJCC 8th Edition - Clinical stage from 09/14/2021: Stage IA (cT1c, cN0, cM0, G3, ER+, PR-, HER2+) - Signed by Katherine Merle, MD on 09/22/2021 Stage prefix: Initial diagnosis Histologic grading system: 3 grade system   09/14/2021 Initial Biopsy   Diagnosis Breast, left, needle core biopsy, 1 o'clock, 4cmfn - INVASIVE MAMMARY CARCINOMA. SEE NOTE Diagnosis Note Carcinoma measures 1.3 cm in greatest linear dimension and appears grade 2-3.  Immunohistochemical stain for E-cadherin is negative, consistent with a lobular phenotype.  PROGNOSTIC INDICATORS Results: The tumor cells are POSITIVE for Her2 (3+). Estrogen Receptor: 50%, POSITIVE, MODERATE-WEAK STAINING INTENSITY Progesterone Receptor: 0%, NEGATIVE Proliferation Marker Ki67: 15%   09/18/2021 Initial Diagnosis   Malignant neoplasm of upper-outer quadrant of left breast in female, estrogen receptor positive (Katherine Donovan)   09/21/2021 Imaging   EXAM: BILATERAL BREAST MRI WITH AND WITHOUT CONTRAST  IMPRESSION: 1. 3 cm  biopsy-proven malignancy within the UPPER-OUTER LEFT breast. No evidence of multifocal, multicentric or contralateral malignancy. No abnormal lymph nodes.   10/07/2021 -  Chemotherapy   Patient is on Treatment Plan : BREAST  Docetaxel + Carboplatin + Trastuzumab + Pertuzumab  (TCHP) q21d      10/16/2021 Genetic Testing   Negative genetic testing: no pathogenic variants detected in [facility] CancerNext-Expanded +RNAinsight Panel.  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 Report date is October 16, 2021.   The CancerNext-Expanded gene panel offered by [facility] and includes sequencing, rearrangement, and RNA analysis for the following 77 genes: AIP, ALK, APC, ATM, AXIN2, BAP1, BARD1, BLM, BMPR1A, BRCA1, BRCA2, BRIP1, CDC73, CDH1, CDK4, CDKN1B, CDKN2A, CHEK2, CTNNA1, DICER1, FANCC, FH, FLCN, GALNT12, KIF1B, LZTR1, MAX, MEN1, MET, MLH1, MSH2, MSH3, MSH6, MUTYH, NBN, NF1, NF2, NTHL1, PALB2, PHOX2B, PMS2, POT1, PRKAR1A, PTCH1, PTEN, RAD51C, RAD51D, RB1, RECQL, RET, SDHA, SDHAF2, SDHB, SDHC, SDHD, SMAD4, SMARCA4, SMARCB1, SMARCE1, STK11, SUFU, TMEM127, TP53, TSC1, TSC2, VHL and XRCC2 (sequencing and deletion/duplication); EGFR, EGLN1, HOXB13, KIT, MITF, PDGFRA, POLD1, and POLE (sequencing only); EPCAM and GREM1 (deletion/duplication only).      INTERVAL HISTORY:  Katherine Donovan is here for a follow up of breast cancer. She was last seen by me on 12/30/21. She presents to the clinic alone. She reports she had difficulty with the last cycle of chemo. She reports she has numbness/tingling to her fingers and numbness to her feet. She notes  she uses her husband as support when they are out walking. She reports she also had difficulty eating and diarrhea. She notes she is having 2-3 watery BM a day.   All other systems were reviewed with the patient and are negative.  MEDICAL HISTORY:  Past Medical History:  Diagnosis Date   History of bunionectomy 09/02/2020   Hyperlipidemia     Hyperlipidemia    left breast ILC 09/15/21   Osteoporosis    Rosacea    to face    SURGICAL HISTORY: Past Surgical History:  Procedure Laterality Date   PORTACATH PLACEMENT Right 10/06/2021   Procedure: INSERTION PORT-A-CATH;  Surgeon: Katherine Bookbinder, MD;  Location: [place];  Service: General;  Laterality: Right;   [place]      I have reviewed the social history and family history with the patient and they are unchanged from previous note.  ALLERGIES:  has No Known Allergies.  MEDICATIONS:  Current Outpatient Medications  Medication Sig Dispense Refill   alendronate (FOSAMAX) 70 MG tablet Take 70 mg by mouth once a week. Take with a full glass of water on an empty stomach.     Calcium Carbonate-Vitamin D 600-400 MG-UNIT tablet Take 1 tablet by mouth daily.       CRESTOR 10 MG tablet Take 5 mg by mouth Daily.     dexamethasone (DECADRON) 4 MG tablet Take 1 tablet (4 mg total) by mouth daily. Take 1 tab twice daily the day before Taxotere. Then take 1 tab daily x 3 days after chemotherapy. 30 tablet 0   diphenoxylate-atropine (LOMOTIL) 2.5-0.025 MG tablet Take 2 tablets by mouth 4 (four) times daily as needed for diarrhea or loose stools (Maximum dose = 8 tablets/day). Maximum dose = 8 tablets/day 30 tablet 0   Glucosamine 750 MG TABS Take 750 mg by mouth daily.       hydrocortisone 2.5 % cream Apply 1 application. topically as needed.     Lactobacillus Rhamnosus, GG, (RA PROBIOTIC DIGESTIVE CARE) CAPS Take 1 capsule by mouth 3 (three) times a week.     lidocaine-prilocaine (EMLA) cream Apply to affected area once 30 g 3   metroNIDAZOLE (METROGEL) 0.75 % gel Apply 1 application. topically 2 (two) times daily.     Misc Natural Products (JOINT SUPPORT COMPLEX PO) Take by mouth daily. [place] Joint[employer]-Cartilage-Bone Triple Action Joint Health; Undenatured Type II Collagen     Multiple Vitamin (MULTI-VITAMIN) tablet Take 1 tablet by mouth  daily.     ondansetron (ZOFRAN) 8 MG tablet Take 1 tablet (8 mg total) by mouth 2 (two) times daily as needed (Nausea or vomiting). Start on the third day after chemotherapy. 30 tablet 1   prochlorperazine (COMPAZINE) 10 MG tablet Take 1 tablet (10 mg total) by mouth every 6 (six) hours as needed (Nausea or vomiting). 30 tablet 1   No current facility-administered medications for this visit.   Facility-Administered Medications Ordered in Other Visits  Medication Dose Route Frequency Provider Last Rate Last Admin   heparin lock flush 100 unit/mL  500 Units Intracatheter Once PRN Katherine Merle, MD       pertuzumab (PERJETA) 420 mg in sodium chloride 0.9 % 250 mL chemo infusion  420 mg Intravenous Once Katherine Merle, MD       sodium chloride flush (NS) 0.9 % injection 10 mL  10 mL Intracatheter PRN Katherine Merle, MD       trastuzumab-dkst (OGIVRI) 336 mg in sodium chloride  0.9 % 250 mL chemo infusion  6 mg/kg (Treatment Plan Recorded) Intravenous Once Katherine Merle, MD        PHYSICAL EXAMINATION: ECOG PERFORMANCE STATUS: 1 - Symptomatic but completely ambulatory  Vitals:   01/20/22 0934  BP: (!) 142/83  Pulse: 84  Resp: 18  Temp: 98.4 F (36.9 C)  SpO2: 99%   Wt Readings from Last 3 Encounters:  01/20/22 121 lb 8 oz (55.1 kg)  12/30/21 122 lb 4.8 oz (55.5 kg)  12/09/21 122 lb 6.4 oz (55.5 kg)     GENERAL:alert, no distress and comfortable SKIN: skin color normal, no rashes or significant lesions EYES: normal, Conjunctiva are pink and non-injected, sclera clear  NEURO: alert & oriented x 3 with fluent speech  LABORATORY DATA:  I have reviewed the data as listed    Latest Ref Rng & Units 01/20/2022    9:19 AM 12/30/2021    9:17 AM 12/09/2021    8:56 AM  CBC  WBC 4.0 - 10.5 K/uL 9.7  17.5  15.9   Hemoglobin 12.0 - 15.0 g/dL 11.4  11.3  11.7   Hematocrit 36.0 - 46.0 % 34.6  33.9  35.4   Platelets 150 - 400 K/uL 245  299  310         Latest Ref Rng & Units 01/20/2022    9:19 AM 12/30/2021     9:17 AM 12/09/2021    8:56 AM  CMP  Glucose 70 - 99 mg/dL 94  91  101   BUN 8 - 23 mg/dL '17  25  18   ' Creatinine 0.44 - 1.00 mg/dL 0.56  0.59  0.63   Sodium 135 - 145 mmol/L 140  141  140   Potassium 3.5 - 5.1 mmol/L 4.3  4.0  4.0   Chloride 98 - 111 mmol/L 108  107  108   CO2 22 - 32 mmol/L '28  28  26   ' Calcium 8.9 - 10.3 mg/dL 9.8  10.2  10.1   Total Protein 6.5 - 8.1 g/dL 5.7  6.1  6.1   Total Bilirubin 0.3 - 1.2 mg/dL 0.3  0.4  0.4   Alkaline Phos 38 - 126 U/L 53  61  60   AST 15 - 41 U/L '24  22  22   ' ALT 0 - 44 U/L '15  14  15       ' RADIOGRAPHIC STUDIES: I have personally reviewed the radiological images as listed and agreed with the findings in the report. No results found.    Orders Placed This Encounter  Procedures   ECHOCARDIOGRAM COMPLETE    Standing Status:   Future    Standing Expiration Date:   01/21/2023    Order Specific Question:   Where should this test be performed    Answer:   [place]    Order Specific Question:   Perflutren DEFINITY (image enhancing agent) should be administered unless hypersensitivity or allergy exist    Answer:   Administer Perflutren    Order Specific Question:   Is a special reader required? (athlete or structural heart)    Answer:   No    Order Specific Question:   Does this study need to be read by the Structural team/Level 3 readers?    Answer:   No    Order Specific Question:   Reason for exam-Echo    Answer:   Chemo  Z09   All questions were answered. The patient knows to call the  clinic with any problems, questions or concerns. No barriers to learning was detected. The total time spent in the appointment was 30 minutes.     Katherine Merle, MD 01/20/2022   I, Wilburn Mylar, am acting as scribe for Katherine Merle, MD.   I have reviewed the above documentation for accuracy and completeness, and I agree with the above.

## 2022-01-20 NOTE — Telephone Encounter (Signed)
Called patient to schedule a consultation w. Alison. No answer, LVM for a return call.  

## 2022-01-21 ENCOUNTER — Telehealth: Payer: Self-pay | Admitting: Hematology

## 2022-01-21 ENCOUNTER — Telehealth: Payer: Self-pay | Admitting: Radiation Oncology

## 2022-01-21 NOTE — Telephone Encounter (Signed)
Scheduled follow-up appointment per 7/19 los. Patient is aware.

## 2022-01-21 NOTE — Telephone Encounter (Signed)
Called patient to schedule a consultation w. Alison. No answer, LVM for a return call.  

## 2022-01-22 ENCOUNTER — Inpatient Hospital Stay: Payer: Medicare Other

## 2022-01-25 ENCOUNTER — Other Ambulatory Visit: Payer: Self-pay

## 2022-01-28 ENCOUNTER — Ambulatory Visit (HOSPITAL_COMMUNITY)
Admission: RE | Admit: 2022-01-28 | Discharge: 2022-01-28 | Disposition: A | Discharge status: Home / Self Care | Payer: Medicare Other | Source: Ambulatory Visit | Attending: Hematology | Admitting: Hematology

## 2022-01-28 ENCOUNTER — Encounter (HOSPITAL_BASED_OUTPATIENT_CLINIC_OR_DEPARTMENT_OTHER): Payer: Self-pay | Admitting: General Surgery

## 2022-01-28 ENCOUNTER — Other Ambulatory Visit: Payer: Self-pay

## 2022-01-28 DIAGNOSIS — I081 Rheumatic disorders of both mitral and tricuspid valves: Secondary | ICD-10-CM | POA: Insufficient documentation

## 2022-01-28 DIAGNOSIS — Z170 Estrogen receptor positive status [ER+]: Secondary | ICD-10-CM

## 2022-01-28 DIAGNOSIS — I5020 Unspecified systolic (congestive) heart failure: Secondary | ICD-10-CM | POA: Diagnosis not present

## 2022-01-28 DIAGNOSIS — Z01818 Encounter for other preprocedural examination: Secondary | ICD-10-CM | POA: Diagnosis present

## 2022-01-28 DIAGNOSIS — Z0189 Encounter for other specified special examinations: Secondary | ICD-10-CM | POA: Diagnosis not present

## 2022-01-28 DIAGNOSIS — C50412 Malignant neoplasm of upper-outer quadrant of left female breast: Secondary | ICD-10-CM | POA: Diagnosis not present

## 2022-01-28 LAB — ECHOCARDIOGRAM COMPLETE
Area-P 1/2: 3.23 cm2
S' Lateral: 2.3 cm

## 2022-01-29 NOTE — Progress Notes (Signed)
Pt given Ensure presurgery drink with instruction to finish by 0830 on DOS. Also given CHG soap with instruction taped to bottle. Also given "DOS Checklist" instructions. Pt verbalized understanding     Enhanced Recovery after Surgery for Orthopedics Enhanced Recovery after Surgery is a protocol used to improve the stress on your body and your recovery after surgery.  Patient Instructions  The night before surgery:  No food after midnight. ONLY clear liquids after midnight  The day of surgery (if you do NOT have diabetes):  Drink ONE (1) Pre-Surgery Clear Ensure as directed.   This drink was given to you during your hospital  pre-op appointment visit. The pre-op nurse will instruct you on the time to drink the  Pre-Surgery Ensure depending on your surgery time. Finish the drink at the designated time by the pre-op nurse.  Nothing else to drink after completing the  Pre-Surgery Clear Ensure.  The day of surgery (if you have diabetes): Drink ONE (1) Gatorade 2 (G2) as directed. This drink was given to you during your hospital  pre-op appointment visit.  The pre-op nurse will instruct you on the time to drink the   Gatorade 2 (G2) depending on your surgery time. Color of the Gatorade may vary. Red is not allowed. Nothing else to drink after completing the  Gatorade 2 (G2).         If you have questions, please contact your surgeon's office.

## 2022-02-02 ENCOUNTER — Other Ambulatory Visit: Payer: Self-pay

## 2022-02-03 ENCOUNTER — Ambulatory Visit
Admission: RE | Admit: 2022-02-03 | Discharge: 2022-02-03 | Disposition: A | Discharge status: Home / Self Care | Payer: Medicare Other | Source: Ambulatory Visit | Attending: General Surgery | Admitting: General Surgery

## 2022-02-03 DIAGNOSIS — Z170 Estrogen receptor positive status [ER+]: Secondary | ICD-10-CM

## 2022-02-03 NOTE — Anesthesia Preprocedure Evaluation (Signed)
Anesthesia Evaluation  Patient identified by MRN, date of birth, ID band Patient awake    Reviewed: Allergy & Precautions, NPO status , Patient's Chart, lab work & pertinent test results  Airway Mallampati: II  TM Distance: >3 FB Neck ROM: Full    Dental no notable dental hx. (+) Teeth Intact, Dental Advisory Given   Pulmonary former smoker,    Pulmonary exam normal breath sounds clear to auscultation       Cardiovascular Exercise Tolerance: Good Normal cardiovascular exam Rhythm:Regular Rate:Normal     Neuro/Psych    GI/Hepatic negative GI ROS, Neg liver ROS,   Endo/Other    Renal/GU negative Renal ROSLab Results      Component                Value               Date                      CREATININE               0.56                01/20/2022                BUN                      17                  01/20/2022                NA                       140                 01/20/2022                K                        4.3                 01/20/2022                    Musculoskeletal negative musculoskeletal ROS (+)   Abdominal   Peds  Hematology Lab Results      Component                Value               Date                      WBC                      9.7                 01/20/2022                HGB                      11.4 (L)            01/20/2022                HCT                      34.6 (L)  01/20/2022                MCV                      96.1                01/20/2022                PLT                      245                 01/20/2022              Anesthesia Other Findings L Breast CA  Reproductive/Obstetrics                            Anesthesia Physical Anesthesia Plan  ASA: 3  Anesthesia Plan: General and Regional   Post-op Pain Management: Regional block*, Precedex and Minimal or no pain anticipated   Induction: Intravenous  PONV Risk Score  and Plan: 4 or greater and Treatment may vary due to age or medical condition, Ondansetron and TIVA  Airway Management Planned: LMA  Additional Equipment: None  Intra-op Plan:   Post-operative Plan:   Informed Consent: I have reviewed the patients History and Physical, chart, labs and discussed the procedure including the risks, benefits and alternatives for the proposed anesthesia with the patient or authorized representative who has indicated his/her understanding and acceptance.     Dental advisory given  Plan Discussed with: CRNA  Anesthesia Plan Comments: (L Pec block)       Anesthesia Quick Evaluation

## 2022-02-04 ENCOUNTER — Ambulatory Visit (HOSPITAL_BASED_OUTPATIENT_CLINIC_OR_DEPARTMENT_OTHER)
Admission: RE | Admit: 2022-02-04 | Discharge: 2022-02-04 | Disposition: A | Discharge status: Home / Self Care | Payer: Medicare Other | Attending: General Surgery | Admitting: General Surgery

## 2022-02-04 ENCOUNTER — Encounter (HOSPITAL_BASED_OUTPATIENT_CLINIC_OR_DEPARTMENT_OTHER)
Admission: RE | Disposition: A | Discharge status: Home / Self Care | Payer: Self-pay | Source: Home / Self Care | Attending: General Surgery

## 2022-02-04 ENCOUNTER — Ambulatory Visit (HOSPITAL_BASED_OUTPATIENT_CLINIC_OR_DEPARTMENT_OTHER): Payer: Medicare Other | Admitting: Anesthesiology

## 2022-02-04 ENCOUNTER — Encounter (HOSPITAL_BASED_OUTPATIENT_CLINIC_OR_DEPARTMENT_OTHER): Payer: Self-pay | Admitting: General Surgery

## 2022-02-04 ENCOUNTER — Other Ambulatory Visit: Payer: Self-pay

## 2022-02-04 ENCOUNTER — Ambulatory Visit
Admission: RE | Admit: 2022-02-04 | Discharge: 2022-02-04 | Disposition: A | Discharge status: Home / Self Care | Payer: Medicare Other | Source: Ambulatory Visit | Attending: General Surgery | Admitting: General Surgery

## 2022-02-04 DIAGNOSIS — C50412 Malignant neoplasm of upper-outer quadrant of left female breast: Secondary | ICD-10-CM | POA: Diagnosis present

## 2022-02-04 DIAGNOSIS — C50912 Malignant neoplasm of unspecified site of left female breast: Secondary | ICD-10-CM | POA: Diagnosis not present

## 2022-02-04 DIAGNOSIS — Z170 Estrogen receptor positive status [ER+]: Secondary | ICD-10-CM | POA: Insufficient documentation

## 2022-02-04 DIAGNOSIS — N6032 Fibrosclerosis of left breast: Secondary | ICD-10-CM | POA: Insufficient documentation

## 2022-02-04 DIAGNOSIS — Z9221 Personal history of antineoplastic chemotherapy: Secondary | ICD-10-CM | POA: Diagnosis not present

## 2022-02-04 DIAGNOSIS — Z87891 Personal history of nicotine dependence: Secondary | ICD-10-CM | POA: Insufficient documentation

## 2022-02-04 DIAGNOSIS — Z01818 Encounter for other preprocedural examination: Secondary | ICD-10-CM

## 2022-02-04 HISTORY — PX: BREAST LUMPECTOMY WITH RADIOACTIVE SEED AND SENTINEL LYMPH NODE BIOPSY: SHX6550

## 2022-02-04 SURGERY — BREAST LUMPECTOMY WITH RADIOACTIVE SEED AND SENTINEL LYMPH NODE BIOPSY
Anesthesia: Regional | Site: Breast | Laterality: Left

## 2022-02-04 MED ORDER — PROPOFOL 10 MG/ML IV BOLUS
INTRAVENOUS | Status: DC | PRN
  Administered 2022-02-04: 100 mg via INTRAVENOUS

## 2022-02-04 MED ORDER — DEXMEDETOMIDINE (PRECEDEX) IN NS 20 MCG/5ML (4 MCG/ML) IV SYRINGE
PREFILLED_SYRINGE | INTRAVENOUS | Status: DC | PRN
  Administered 2022-02-04: 8 ug via INTRAVENOUS

## 2022-02-04 MED ORDER — METHYLENE BLUE 1 % INJ SOLN
INTRAVENOUS | Status: AC
  Filled 2022-02-04: qty 10

## 2022-02-04 MED ORDER — BUPIVACAINE HCL (PF) 0.25 % IJ SOLN
INTRAMUSCULAR | Status: DC | PRN
  Administered 2022-02-04: 3 mL

## 2022-02-04 MED ORDER — EPHEDRINE 5 MG/ML INJ
INTRAVENOUS | Status: AC
Start: 2022-02-04 — End: ?
  Filled 2022-02-04: qty 5

## 2022-02-04 MED ORDER — ACETAMINOPHEN 500 MG PO TABS
1000.0000 mg | ORAL_TABLET | ORAL | Status: AC
  Administered 2022-02-04: 1000 mg via ORAL

## 2022-02-04 MED ORDER — TRAMADOL HCL 50 MG PO TABS: 100.0000 mg | ORAL_TABLET | Freq: Four times a day (QID) | ORAL | 0 refills | Status: DC | PRN

## 2022-02-04 MED ORDER — SUCCINYLCHOLINE CHLORIDE 200 MG/10ML IV SOSY
PREFILLED_SYRINGE | INTRAVENOUS | Status: AC
Start: 2022-02-04 — End: ?
  Filled 2022-02-04: qty 10

## 2022-02-04 MED ORDER — CEFAZOLIN SODIUM-DEXTROSE 2-4 GM/100ML-% IV SOLN
2.0000 g | INTRAVENOUS | Status: AC
  Administered 2022-02-04: 2 g via INTRAVENOUS

## 2022-02-04 MED ORDER — ACETAMINOPHEN 500 MG PO TABS
ORAL_TABLET | ORAL | Status: AC
Start: 2022-02-04 — End: ?
  Filled 2022-02-04: qty 2

## 2022-02-04 MED ORDER — BUPIVACAINE HCL (PF) 0.25 % IJ SOLN
INTRAMUSCULAR | Status: AC
  Filled 2022-02-04: qty 30

## 2022-02-04 MED ORDER — ROPIVACAINE HCL 5 MG/ML IJ SOLN
INTRAMUSCULAR | Status: DC | PRN
  Administered 2022-02-04: 30 mL

## 2022-02-04 MED ORDER — MIDAZOLAM HCL 2 MG/2ML IJ SOLN
INTRAMUSCULAR | Status: AC
  Filled 2022-02-04: qty 2

## 2022-02-04 MED ORDER — CHLORHEXIDINE GLUCONATE CLOTH 2 % EX PADS: 6.0000 | MEDICATED_PAD | Freq: Once | CUTANEOUS | Status: DC

## 2022-02-04 MED ORDER — FENTANYL CITRATE (PF) 100 MCG/2ML IJ SOLN
INTRAMUSCULAR | Status: AC
  Filled 2022-02-04: qty 2

## 2022-02-04 MED ORDER — FENTANYL CITRATE (PF) 100 MCG/2ML IJ SOLN
INTRAMUSCULAR | Status: DC | PRN
  Administered 2022-02-04: 25 ug via INTRAVENOUS

## 2022-02-04 MED ORDER — DEXAMETHASONE SODIUM PHOSPHATE 10 MG/ML IJ SOLN
INTRAMUSCULAR | Status: AC
  Filled 2022-02-04: qty 1

## 2022-02-04 MED ORDER — ONDANSETRON HCL 4 MG/2ML IJ SOLN
INTRAMUSCULAR | Status: AC
Start: 2022-02-04 — End: ?
  Filled 2022-02-04: qty 2

## 2022-02-04 MED ORDER — ATROPINE SULFATE 0.4 MG/ML IV SOLN
INTRAVENOUS | Status: AC
  Filled 2022-02-04: qty 1

## 2022-02-04 MED ORDER — SODIUM CHLORIDE (PF) 0.9 % IJ SOLN
INTRAVENOUS | Status: DC | PRN
  Administered 2022-02-04: 2.5 mL via INTRAMUSCULAR

## 2022-02-04 MED ORDER — LIDOCAINE HCL (CARDIAC) PF 100 MG/5ML IV SOSY
PREFILLED_SYRINGE | INTRAVENOUS | Status: DC | PRN
  Administered 2022-02-04: 80 mg via INTRAVENOUS

## 2022-02-04 MED ORDER — PHENYLEPHRINE 80 MCG/ML (10ML) SYRINGE FOR IV PUSH (FOR BLOOD PRESSURE SUPPORT)
PREFILLED_SYRINGE | INTRAVENOUS | Status: AC
  Filled 2022-02-04: qty 10

## 2022-02-04 MED ORDER — SODIUM CHLORIDE (PF) 0.9 % IJ SOLN
INTRAMUSCULAR | Status: AC
  Filled 2022-02-04: qty 10

## 2022-02-04 MED ORDER — PROPOFOL 500 MG/50ML IV EMUL
INTRAVENOUS | Status: DC | PRN
  Administered 2022-02-04: 250 ug via INTRAVENOUS

## 2022-02-04 MED ORDER — LIDOCAINE 2% (20 MG/ML) 5 ML SYRINGE
INTRAMUSCULAR | Status: AC
  Filled 2022-02-04: qty 5

## 2022-02-04 MED ORDER — ENSURE PRE-SURGERY PO LIQD: 296.0000 mL | Freq: Once | ORAL | Status: DC

## 2022-02-04 MED ORDER — MIDAZOLAM HCL 2 MG/2ML IJ SOLN
1.0000 mg | Freq: Once | INTRAMUSCULAR | Status: AC
  Administered 2022-02-04: 1 mg via INTRAVENOUS

## 2022-02-04 MED ORDER — FENTANYL CITRATE (PF) 100 MCG/2ML IJ SOLN
50.0000 ug | Freq: Once | INTRAMUSCULAR | Status: AC
  Administered 2022-02-04: 50 ug via INTRAVENOUS

## 2022-02-04 MED ORDER — LACTATED RINGERS IV SOLN: INTRAVENOUS | Status: DC

## 2022-02-04 MED ORDER — DEXAMETHASONE SODIUM PHOSPHATE 4 MG/ML IJ SOLN
INTRAMUSCULAR | Status: DC | PRN
  Administered 2022-02-04: 5 mg via INTRAVENOUS

## 2022-02-04 MED ORDER — CEFAZOLIN SODIUM-DEXTROSE 2-4 GM/100ML-% IV SOLN
INTRAVENOUS | Status: AC
  Filled 2022-02-04: qty 100

## 2022-02-04 SURGICAL SUPPLY — 59 items
ADH SKN CLS APL DERMABOND .7 (GAUZE/BANDAGES/DRESSINGS) ×1
APL PRP STRL LF DISP 70% ISPRP (MISCELLANEOUS) ×1
APPLIER CLIP 9.375 MED OPEN (MISCELLANEOUS) ×2
APR CLP MED 9.3 20 MLT OPN (MISCELLANEOUS) ×1
BINDER BREAST LRG (GAUZE/BANDAGES/DRESSINGS) IMPLANT
BINDER BREAST MEDIUM (GAUZE/BANDAGES/DRESSINGS) IMPLANT
BINDER BREAST XLRG (GAUZE/BANDAGES/DRESSINGS) IMPLANT
BINDER BREAST XXLRG (GAUZE/BANDAGES/DRESSINGS) IMPLANT
BLADE SURG 15 STRL LF DISP TIS (BLADE) ×1 IMPLANT
BLADE SURG 15 STRL SS (BLADE) ×2
CANISTER SUC SOCK COL 7IN (MISCELLANEOUS) IMPLANT
CANISTER SUCT 1200ML W/VALVE (MISCELLANEOUS) IMPLANT
CHLORAPREP W/TINT 26 (MISCELLANEOUS) ×2 IMPLANT
CLIP APPLIE 9.375 MED OPEN (MISCELLANEOUS) IMPLANT
CLIP TI WIDE RED SMALL 6 (CLIP) ×2 IMPLANT
COVER BACK TABLE 60X90IN (DRAPES) ×2 IMPLANT
COVER MAYO STAND STRL (DRAPES) ×2 IMPLANT
COVER PROBE W GEL 5X96 (DRAPES) ×2 IMPLANT
DERMABOND ADVANCED (GAUZE/BANDAGES/DRESSINGS) ×1
DERMABOND ADVANCED .7 DNX12 (GAUZE/BANDAGES/DRESSINGS) ×1 IMPLANT
DRAPE LAPAROSCOPIC ABDOMINAL (DRAPES) ×2 IMPLANT
DRAPE UTILITY XL STRL (DRAPES) ×2 IMPLANT
ELECT COATED BLADE 2.86 ST (ELECTRODE) ×2 IMPLANT
ELECT REM PT RETURN 9FT ADLT (ELECTROSURGICAL) ×2
ELECTRODE REM PT RTRN 9FT ADLT (ELECTROSURGICAL) ×1 IMPLANT
GLOVE BIO SURGEON STRL SZ7 (GLOVE) ×4 IMPLANT
GLOVE BIOGEL PI IND STRL 7.5 (GLOVE) ×1 IMPLANT
GLOVE BIOGEL PI INDICATOR 7.5 (GLOVE) ×1
GOWN STRL REUS W/ TWL LRG LVL3 (GOWN DISPOSABLE) ×2 IMPLANT
GOWN STRL REUS W/TWL LRG LVL3 (GOWN DISPOSABLE) ×4
HEMOSTAT ARISTA ABSORB 3G PWDR (HEMOSTASIS) ×1 IMPLANT
KIT MARKER MARGIN INK (KITS) ×2 IMPLANT
NDL HYPO 25X1 1.5 SAFETY (NEEDLE) ×1 IMPLANT
NDL SAFETY ECLIPSE 18X1.5 (NEEDLE) IMPLANT
NEEDLE HYPO 18GX1.5 SHARP (NEEDLE)
NEEDLE HYPO 25X1 1.5 SAFETY (NEEDLE) ×2 IMPLANT
NS IRRIG 1000ML POUR BTL (IV SOLUTION) IMPLANT
PACK BASIN DAY SURGERY FS (CUSTOM PROCEDURE TRAY) ×2 IMPLANT
PENCIL SMOKE EVACUATOR (MISCELLANEOUS) ×2 IMPLANT
RETRACTOR ONETRAX LX 90X20 (MISCELLANEOUS) IMPLANT
SLEEVE SCD COMPRESS KNEE MED (STOCKING) ×2 IMPLANT
SPIKE FLUID TRANSFER (MISCELLANEOUS) IMPLANT
SPONGE T-LAP 4X18 ~~LOC~~+RFID (SPONGE) ×2 IMPLANT
STRIP CLOSURE SKIN 1/2X4 (GAUZE/BANDAGES/DRESSINGS) ×2 IMPLANT
SUT ETHILON 2 0 FS 18 (SUTURE) IMPLANT
SUT MNCRL AB 4-0 PS2 18 (SUTURE) ×2 IMPLANT
SUT MON AB 5-0 PS2 18 (SUTURE) IMPLANT
SUT SILK 2 0 SH (SUTURE) IMPLANT
SUT VIC AB 2-0 SH 27 (SUTURE) ×2
SUT VIC AB 2-0 SH 27XBRD (SUTURE) ×1 IMPLANT
SUT VIC AB 3-0 SH 27 (SUTURE) ×2
SUT VIC AB 3-0 SH 27X BRD (SUTURE) ×1 IMPLANT
SUT VIC AB 5-0 PS2 18 (SUTURE) IMPLANT
SYR CONTROL 10ML LL (SYRINGE) ×2 IMPLANT
TOWEL GREEN STERILE FF (TOWEL DISPOSABLE) ×2 IMPLANT
TRACER MAGTRACE VIAL (MISCELLANEOUS) IMPLANT
TRAY FAXITRON CT DISP (TRAY / TRAY PROCEDURE) ×2 IMPLANT
TUBE CONNECTING 20X1/4 (TUBING) IMPLANT
YANKAUER SUCT BULB TIP NO VENT (SUCTIONS) IMPLANT

## 2022-02-04 NOTE — Anesthesia Procedure Notes (Signed)
Procedure Name: LMA Insertion Date/Time: 02/04/2022 12:31 PM  Performed by: Willa Frater, CRNAPre-anesthesia Checklist: Patient identified, Emergency Drugs available, Suction available and Patient being monitored Patient Re-evaluated:Patient Re-evaluated prior to induction Oxygen Delivery Method: Circle System Utilized Preoxygenation: Pre-oxygenation with 100% oxygen Induction Type: IV induction Ventilation: Mask ventilation without difficulty LMA: LMA inserted LMA Size: 3.0 Number of attempts: 1 Airway Equipment and Method: bite block Placement Confirmation: positive ETCO2 Tube secured with: Tape Dental Injury: Teeth and Oropharynx as per pre-operative assessment

## 2022-02-04 NOTE — Discharge Instructions (Addendum)
[place] Office Phone Number [phone]  POST OP INSTRUCTIONS Take 400 mg of ibuprofen every 8 hours or 650 mg tylenol every 6 hours for next 72 hours then as needed. Use ice several times daily also.  A prescription for pain medication may be given to you upon discharge.  Take your pain medication as prescribed, if needed.  If narcotic pain medicine is not needed, then you may take acetaminophen (Tylenol), naprosyn (Alleve) or ibuprofen (Advil) as needed. Take your usually prescribed medications unless otherwise directed If you need a refill on your pain medication, please contact your pharmacy.  They will contact our office to request authorization.  Prescriptions will not be filled after 5pm or on week-ends. You should eat very light the first 24 hours after surgery, such as soup, crackers, pudding, etc.  Resume your normal diet the day after surgery. Most patients will experience some swelling and bruising in the breast.  Ice packs and a good support bra will help.  Wear the breast binder provided or a sports bra for 72 hours day and night.  After that wear a sports bra during the day until you return to the office. Swelling and bruising can take several days to resolve.  It is common to experience some constipation if taking pain medication after surgery.  Increasing fluid intake and taking a stool softener will usually help or prevent this problem from occurring.  A mild laxative (Milk of Magnesia or Miralax) should be taken according to package directions if there are no bowel movements after 48 hours. I used skin glue on the incision, you may shower in 24 hours.  The glue will flake off over the next 2-3 weeks.  Any sutures or staples will be removed at the office during your follow-up visit. ACTIVITIES:  You may resume regular daily activities (gradually increasing) beginning the next day.  Wearing a good support bra or sports bra minimizes pain and swelling.  You may have  sexual intercourse when it is comfortable. You may drive when you no longer are taking prescription pain medication, you can comfortably wear a seatbelt, and you can safely maneuver your car and apply brakes. RETURN TO WORK:  ______________________________________________________________________________________ Dennis Bast should see your doctor in the office for a follow-up appointment approximately two weeks after your surgery.  Your doctor's nurse will typically make your follow-up appointment when she calls you with your pathology report.  Expect your pathology report 3-4 business days after your surgery.  You may call to check if you do not hear from [place] after three days. OTHER INSTRUCTIONS: _______________________________________________________________________________________________ _____________________________________________________________________________________________________________________________________ _____________________________________________________________________________________________________________________________________ _____________________________________________________________________________________________________________________________________  WHEN TO CALL DR WAKEFIELD: Fever over 101.0 Nausea and/or vomiting. Extreme swelling or bruising. Continued bleeding from incision. Increased pain, redness, or drainage from the incision.  The clinic staff is available to answer your questions during regular business hours.  Please don't hesitate to call and ask to speak to one of the nurses for clinical concerns.  If you have a medical emergency, go to the nearest emergency room or call 911.  A surgeon from [facility] is always on call at the hospital.  For further questions, please visit [email or web address]    Post Anesthesia Home Care Instructions  Activity: Get plenty of rest for the remainder of the day. A responsible individual must stay  with you for 24 hours following the procedure.  For the next 24 hours, DO NOT: -Drive a car -Paediatric nurse -Drink alcoholic beverages -Take any medication unless instructed  by your physician -Make any legal decisions or sign important papers.  Meals: Start with liquid foods such as gelatin or soup. Progress to regular foods as tolerated. Avoid greasy, spicy, heavy foods. If nausea and/or vomiting occur, drink only clear liquids until the nausea and/or vomiting subsides. Call your physician if vomiting continues.  Special Instructions/Symptoms: Your throat may feel dry or sore from the anesthesia or the breathing tube placed in your throat during surgery. If this causes discomfort, gargle with warm salt water. The discomfort should disappear within 24 hours.  If you had a scopolamine patch placed behind your ear for the management of post- operative nausea and/or vomiting:  1. The medication in the patch is effective for 72 hours, after which it should be removed.  Wrap patch in a tissue and discard in the trash. Wash hands thoroughly with soap and water. 2. You may remove the patch earlier than 72 hours if you experience unpleasant side effects which may include dry mouth, dizziness or visual disturbances. 3. Avoid touching the patch. Wash your hands with soap and water after contact with the patch   No tylenol until after 5pm if needed today.

## 2022-02-04 NOTE — Progress Notes (Signed)
Assisted Dr. Houser with left, pectoralis, ultrasound guided block. Side rails up, monitors on throughout procedure. See vital signs in flow sheet. Tolerated Procedure well. 

## 2022-02-04 NOTE — Transfer of Care (Signed)
Immediate Anesthesia Transfer of Care Note  Patient: Katherine Donovan  Procedure(s) Performed: LEFT BREAST SEED GUIDED LUMPECTOMY AND LEFT AXILLARY SENTINEL NODE BIOPSY (Left: Breast) BLUE DYE INJECTION (Left: Breast)  Patient Location: PACU  Anesthesia Type:General  Level of Consciousness: sedated  Airway & Oxygen Therapy: Patient Spontanous Breathing and Patient connected to face mask oxygen  Post-op Assessment: Report given to RN and Post -op Vital signs reviewed and stable  Post vital signs: Reviewed and stable  Last Vitals:  Vitals Value Taken Time  BP    Temp    Pulse    Resp    SpO2      Last Pain:  Vitals:   02/04/22 1041  TempSrc: Oral  PainSc: 0-No pain      Patients Stated Pain Goal: 8 (83/33/83 2919)  Complications: No notable events documented.

## 2022-02-04 NOTE — H&P (Signed)
76 year old who underwent screening mammogram. The right side is negative. The left side had a breast mass that on ultrasound and mammogram measuring 2.9 x 2.7 x 2.1 cm. There was a concern is exactly 3.8 cm. Her axillary ultrasound was negative. She had an MRI by the time she is seen and it shows a 3 x 2.7 x 2.5 cm mass. 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 She has been doing primary systemic therapy and having a difficult time. Also thought not to have had good clinical response. However MR shows complete imaging response. She finished therapy 28 June.   Review of Systems: A complete review of systems was obtained from the patient. I have reviewed this information and discussed as appropriate with the patient. See HPI as well for other ROS.  Review of Systems  Constitutional: Positive for malaise/fatigue.  All other systems reviewed and are negative.   Medical History: Past Medical History:  Diagnosis Date  History of cancer  Hyperlipidemia   Patient Active Problem List  Diagnosis  Malignant neoplasm of upper-outer quadrant of left breast in female, estrogen receptor positive (CMS-HCC)   Past Surgical History:  Procedure Laterality Date  Open Tubal Ligation  1972  SIGMOIDOSCOPY FLEXIBLE N/A  1999   No Known Allergies  Current Outpatient Medications on File Prior to Visit  Medication Sig Dispense Refill  alendronate (FOSAMAX) 70 MG tablet Take 1 tablet by mouth every 7 (seven) days  calcium carbonate-vitamin D3 (OS-CAL 500+D) 500 mg-5 mcg (200 unit) tablet Take 1 tablet by mouth 2 (two) times daily with meals  rosuvastatin (CRESTOR) 10 MG tablet Take 1 tablet by mouth once daily   Family History  Problem Relation Age of Onset  High blood pressure (Hypertension) Father  Coronary Artery Disease (Blocked arteries around heart) Father  Breast cancer Sister    Social  History   Tobacco Use  Smoking Status Former  Types: Cigarettes  Quit date: 1969  Years since quitting: 54.5  Smokeless Tobacco Never  Marital status: Married  Tobacco Use  Smoking status: Former  Types: Cigarettes  Quit date: 1969  Years since quitting: 54.5  Smokeless tobacco: Never  Vaping Use  Vaping Use: Never used  Substance and Sexual Activity  Alcohol use: Not Currently  Drug use: Not Currently   Objective:   Physical Exam Constitutional:  Appearance: Normal appearance.  Chest:  Breasts: Right: No inverted nipple, mass or nipple discharge.  Left: No inverted nipple, mass or nipple discharge.  Comments: Vague luoq mass, not sure if this is just dense tissue or residual mass Lymphadenopathy:  Upper Body:  Right upper body: No supraclavicular or axillary adenopathy.  Left upper body: No supraclavicular or axillary adenopathy.  Neurological:  Mental Status: She is alert.   Assessment and Plan:    Malignant neoplasm of upper-outer quadrant of left breast in female, estrogen receptor positive (CMS-HCC)  Left breast seed guided lumpectomy, left ax sn biopsy with magtrace and blue dye  We discussed the staging and pathophysiology of breast cancer. We discussed all of the different options for treatment for breast cancer including surgery, chemotherapy, radiation therapy, Herceptin, and antiestrogen therapy.  We discussed a sentinel lymph node biopsy as she does not appear to having lymph node involvement right now. We discussed the performance of that with injection of  Magtrace and blue dye. We discussed that there is a chance of having  a positive node with a sentinel lymph node biopsy and we will await the permanent pathology to make any other first further decisions in terms of her treatment. We discussed up to a 5% risk lifetime of chronic shoulder pain as well as lymphedema associated with a sentinel lymph node biopsy.  We discussed the options for treatment of  the breast cancer which included lumpectomy versus a mastectomy. We discussed the performance of the lumpectomy with radioactive seed placement. We discussed a 5-10% chance of a positive margin requiring reexcision in the operating room. We also discussed that she will need radiation therapy if she undergoes lumpectomy. We discussed mastectomy and the postoperative care for that as well. Mastectomy can be followed by reconstruction. The decision for lumpectomy vs mastectomy has no impact on decision for chemotherapy. Most mastectomy patients will not need radiation therapy. We discussed that there is no difference in her survival whether she undergoes lumpectomy with radiation therapy or antiestrogen therapy versus a mastectomy. There is also no real difference between her recurrence in the breast. We discussed the risks of operation including bleeding, infection, possible reoperation. She understands her further therapy will be based on what her stages at the time of her operation.

## 2022-02-04 NOTE — Anesthesia Postprocedure Evaluation (Signed)
Anesthesia Post Note  Patient: Katherine Donovan  Procedure(s) Performed: LEFT BREAST SEED GUIDED LUMPECTOMY AND LEFT AXILLARY SENTINEL NODE BIOPSY (Left: Breast) BLUE DYE INJECTION (Left: Breast)     Patient location during evaluation: PACU Anesthesia Type: Regional and General Level of consciousness: awake and alert Pain management: pain level controlled Vital Signs Assessment: post-procedure vital signs reviewed and stable Respiratory status: spontaneous breathing, nonlabored ventilation, respiratory function stable and patient connected to nasal cannula oxygen Cardiovascular status: blood pressure returned to baseline and stable Postop Assessment: no apparent nausea or vomiting Anesthetic complications: no   No notable events documented.  Last Vitals:  Vitals:   02/04/22 1400 02/04/22 1415  BP: 128/87 (!) 159/77  Pulse: 69   Resp: 18 16  Temp:  (!) 36.3 C  SpO2: 97% 95%    Last Pain:  Vitals:   02/04/22 1415  TempSrc:   PainSc: 0-No pain                 Barnet Glasgow

## 2022-02-04 NOTE — Op Note (Signed)
Preoperative diagnosis: HER2 positive invasive lobular carcinoma status post primary chemotherapy Postoperative diagnosis: Same as above Procedure: 1.  Left breast radioactive seed guided lumpectomy 2.  Injection of mag trace for sentinel lymph node identification 3.  Left deep axillary sentinel lymph node biopsy Surgeon: Dr. Serita Grammes Anesthesia: General with a pectoral block Estimated blood loss: Minimal Specimens: 1.  Left breast tissue containing seed and clip marked with paint 2.  Additional inferior margin marked short superior, long lateral, double deep 3.  Left deep axillary sentinel lymph nodes with highest count of [id number] Complications: None Drains: None Sponge no counts correct completion Disposition to recovery stable condition  Indications: This is a 76 year old female who underwent a screening mammogram that showed a left breast mass measuring 2.9 x 2.7 x 2.1 cm.  Axillary ultrasound was negative she had an MRI as well that showed a 3 x 2.7 x 2.5 cm mass.  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  We elected to proceed with primary systemic therapy.  She was thought not to have a good clinical response but her MR showed a complete imaging response.  I discussed the options we elected to attempt breast conservation therapy with a sentinel lymph node biopsy.  Procedure: After informed consent was obtained and we did a timeout in preop.  I then injected 2 cc of mag trace in the subareolar position after prepping the area.  She then underwent a pectoral block.  Antibiotics were given.  SCDs were in place.  She was taken to the operating room placed under general anesthesia without complication.  She was prepped and draped in the standard sterile surgical fashion.  Surgical timeout was then performed.  I infiltrated methylene blue dye under the areola as well.  I then noted the seed in the left upper outer quadrant.   I elected to make a very high upper outer quadrant incision and I did both the tumor and the lymph nodes through the same incision.  I made a curvilinear incision and dissected towards the seed.  I then remove the seed and the surrounding tissue.  This was all just very dense tissue.  Mammogram confirmed removal of the seed and the clip.  I thought the inferior margin was dense so I did remove this and marked it as above.  I then placed clips in the cavity.  The posterior margin is now the muscle.  I then entered into the axilla.  I identified several sentinel lymph nodes with the probe.  These all appeared clinically normal.  Once I had done this with there was no background activity.  There were no palpable nodes.  I then proceeded to close down the breast tissue with 2-0 Vicryl.  I then closed the axilla with 2-0 Vicryl.  The skin was closed with 3-0 Vicryl and 4 Monocryl.  Glue and Steri-Strips were applied.  A binder was placed.  She tolerated this well was extubated and transferred to recovery stable.

## 2022-02-04 NOTE — Interval H&P Note (Signed)
History and Physical Interval Note:  02/04/2022 11:04 AM  Katherine Donovan  has presented today for surgery, with the diagnosis of LEFT BREAST CANCER.  The various methods of treatment have been discussed with the patient and family. After consideration of risks, benefits and other options for treatment, the patient has consented to  Procedure(s) with comments: LEFT BREAST SEED GUIDED LUMPECTOMY AND LEFT AXILLARY SENTINEL NODE BIOPSY (Left) - LMA & PEC BLOCK BLUE DYE INJECTION (Left) as a surgical intervention.  The patient's history has been reviewed, patient examined, no change in status, stable for surgery.  I have reviewed the patient's chart and labs.  Questions were answered to the patient's satisfaction.     [name]

## 2022-02-04 NOTE — Anesthesia Procedure Notes (Signed)
Anesthesia Regional Block: Pectoralis block   Pre-Anesthetic Checklist: , timeout performed,  Correct Patient, Correct Site, Correct Laterality,  Correct Procedure, Correct Position, site marked,  Risks and benefits discussed,  Surgical consent,  Pre-op evaluation,  At surgeon's request and post-op pain management  Laterality: Left and Upper  Prep: chloraprep       Needles:  Injection technique: Single-shot  Needle Type: Echogenic Needle     Needle Length: 9cm  Needle Gauge: 21     Additional Needles:   Procedures:,,,, ultrasound used (permanent image in chart),,    Narrative:  Start time: 02/04/2022 12:08 PM End time: 02/04/2022 12:16 PM Injection made incrementally with aspirations every 5 mL.  Performed by: Personally  Anesthesiologist: Barnet Glasgow, MD  Additional Notes: Block assessed. Patient tolerated procedure well.

## 2022-02-05 ENCOUNTER — Encounter (HOSPITAL_BASED_OUTPATIENT_CLINIC_OR_DEPARTMENT_OTHER): Payer: Self-pay | Admitting: General Surgery

## 2022-02-05 NOTE — Progress Notes (Signed)
Left message stating courtesy call and if any questions or concerns please call the doctors office.  

## 2022-02-08 LAB — SURGICAL PATHOLOGY

## 2022-02-08 NOTE — Progress Notes (Unsigned)
Katherine Donovan   [phone] [phone]   Clinic Follow up Note   Patient Care Team: Nickola Major, MD as PCP - General (Family Medicine) Mauro Kaufmann, RN as Oncology Nurse Navigator Rockwell Germany, RN as Oncology Nurse Navigator [name], MD as Consulting Physician (General Surgery) [name], MD as Consulting Physician (Hematology) Kyung Rudd, MD as Consulting Physician (Radiation Oncology) 02/10/2022  CHIEF COMPLAINT: Follow-up left breast cancer  SUMMARY OF ONCOLOGIC HISTORY: Oncology History Overview Note   Cancer Staging  Malignant neoplasm of upper-outer quadrant of left breast in female, estrogen receptor positive ([place]) Staging form: Breast, AJCC 8th Edition - Clinical stage from 09/14/2021: Stage IA (cT1c, cN0, cM0, G3, ER+, PR-, HER2+) - Signed by [name], MD on 09/22/2021    Malignant neoplasm of upper-outer quadrant of left breast in female, estrogen receptor positive ([place])  09/01/2021 Mammogram   CLINICAL DATA:  Bilateral screening recall for a right breast asymmetry and left breast mass.   EXAM: DIGITAL DIAGNOSTIC BILATERAL MAMMOGRAM WITH TOMOSYNTHESIS AND CAD; ULTRASOUND LEFT BREAST LIMITED; ULTRASOUND RIGHT BREAST LIMITED  IMPRESSION: 1. There is a highly suspicious ill-defined mass in the upper-outer left breast. The mass measures at least 2.9 cm, and possibly up to 3.8 cm. Accurate measurement is difficult due to its ill-defined insinuating appearance.   2.  No evidence of left axillary lymphadenopathy.   3. No persistent suspicious mammographic or targeted sonographic abnormalities in the right breast.   09/14/2021 Cancer Staging   Staging form: Breast, AJCC 8th Edition - Clinical stage from 09/14/2021: Stage IA (cT1c, cN0, cM0, G3, ER+, PR-, HER2+) - Signed by [name], MD on 09/22/2021 Stage prefix: Initial diagnosis Histologic grading system: 3 grade system   09/14/2021 Initial Biopsy    Diagnosis Breast, left, needle core biopsy, 1 o'clock, 4cmfn - INVASIVE MAMMARY CARCINOMA. SEE NOTE Diagnosis Note Carcinoma measures 1.3 cm in greatest linear dimension and appears grade 2-3.  Immunohistochemical stain for E-cadherin is negative, consistent with a lobular phenotype.  PROGNOSTIC INDICATORS Results: The tumor cells are POSITIVE for Her2 (3+). Estrogen Receptor: 50%, POSITIVE, MODERATE-WEAK STAINING INTENSITY Progesterone Receptor: 0%, NEGATIVE Proliferation Marker Ki67: 15%   09/18/2021 Initial Diagnosis   Malignant neoplasm of upper-outer quadrant of left breast in female, estrogen receptor positive ([place])   09/21/2021 Imaging   EXAM: BILATERAL BREAST MRI WITH AND WITHOUT CONTRAST  IMPRESSION: 1. 3 cm biopsy-proven malignancy within the UPPER-OUTER LEFT breast. No evidence of multifocal, multicentric or contralateral malignancy. No abnormal lymph nodes.   10/07/2021 -  Chemotherapy   Patient is on Treatment Plan : BREAST  Docetaxel + Carboplatin + Trastuzumab + Pertuzumab  (TCHP) q21d      10/16/2021 Genetic Testing   Negative genetic testing: no pathogenic variants detected in [facility] CancerNext-Expanded +RNAinsight Panel.  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 Report date is October 16, 2021.   The CancerNext-Expanded gene panel offered by [facility] and includes sequencing, rearrangement, and RNA analysis for the following 77 genes: AIP, ALK, APC, ATM, AXIN2, BAP1, BARD1, BLM, BMPR1A, BRCA1, BRCA2, BRIP1, CDC73, CDH1, CDK4, CDKN1B, CDKN2A, CHEK2, CTNNA1, DICER1, FANCC, FH, FLCN, GALNT12, KIF1B, LZTR1, MAX, MEN1, MET, MLH1, MSH2, MSH3, MSH6, MUTYH, NBN, NF1, NF2, NTHL1, PALB2, PHOX2B, PMS2, POT1, PRKAR1A, PTCH1, PTEN, RAD51C, RAD51D, RB1, RECQL, RET, SDHA, SDHAF2, SDHB, SDHC, SDHD, SMAD4, SMARCA4, SMARCB1, SMARCE1, STK11, SUFU, TMEM127, TP53, TSC1, TSC2, VHL and XRCC2 (sequencing and deletion/duplication); EGFR, EGLN1, HOXB13, KIT, MITF,  PDGFRA, POLD1, and  POLE (sequencing only); EPCAM and GREM1 (deletion/duplication only).   02/04/2022 Definitive Surgery   Lumpectomy by Dr. Georgiann Mccoy. BREAST, LEFT, LUMPECTOMY:  - Hyaline fibrosis and focal calcifications with no evidence of residual carcinoma - complete therapeutic response  - Mild fibrocystic change  - See comment  B. LYMPH NODE, LEFT AXILLA, SENTINEL, EXCISION:  - Lymph node, negative for carcinoma (0/1)  C. LYMPH NODE, LEFT AXILLA, SENTINEL, EXCISION:  - Lymph node, negative for carcinoma (0/1)  D. LYMPH NODE, LEFT AXILLA, SENTINEL, EXCISION:  - Lymph node, negative for carcinoma (0/1)  E. LYMPH NODE, LEFT AXILLA, SENTINEL, EXCISION:  - Lymph node, negative for carcinoma (0/1)  F. LYMPH NODE, LEFT AXILLA, SENTINEL, EXCISION:  - Lymph node, negative for carcinoma (0/1)  G. LYMPH NODE, LEFT AXILLA, SENTINEL, EXCISION:  - Lymph node, negative for carcinoma (0/1)  H. BREAST, LEFT INFERIOR MARGIN, EXCISION:  - Hyaline fibrosis with no evidence of residual carcinoma  - Mild fibrocystic change with calcification   COMMENT:  The appropriate pathologic stage is ypT0 ypN0    02/04/2022 Cancer Staging   Staging form: Breast, AJCC 8th Edition - Pathologic stage from 02/04/2022: No Stage Recommended (ypT0, pN0(sn), cM0) - Signed by Alla Feeling, NP on 02/10/2022 Stage prefix: Post-therapy Response to neoadjuvant therapy: Complete response Method of lymph node assessment: Sentinel lymph node biopsy     CURRENT THERAPY: Neoadjuvant TCHP 10/07/2021 - 12/30/2021 (completed 5 cycles), moved to maintenance Herceptin/Perjeta 01/20/2022 q3 weeks to complete 1 year. Due to complete pathologic response, will d/c perjeta and continue herceptin only  INTERVAL HISTORY: [name] returns for follow-up as scheduled, last seen by Dr. Burr Medico 01/20/2022 for cycle 6 when she moved to Herceptin/Perjeta.  Echo 01/28/2022 showed LVEF 60-65%.  She underwent left breast lumpectomy and sentinel lymph node  biopsy by Dr. Donne Hazel 02/04/2022.  She is here to review path and proceed with next cycle of Herceptin/Perjeta. She did very well with surgery, only took tylenol the second night.  She does not want to get undressed today due to difficulty with the compression bra.  She has no concerns.  Denies pain, fever, chills.  She denies residual side effects from chemo, diarrhea resolved.  She is eating better.  She tolerates Herceptin/Perjeta well.  All other systems were reviewed with the patient and are negative.  MEDICAL HISTORY:  Past Medical History:  Diagnosis Date   History of bunionectomy 09/02/2020   Hyperlipidemia    Hyperlipidemia    left breast ILC 09/15/21   Osteoporosis    Rosacea    to face    SURGICAL HISTORY: Past Surgical History:  Procedure Laterality Date   BREAST LUMPECTOMY WITH RADIOACTIVE SEED AND SENTINEL LYMPH NODE BIOPSY Left 02/04/2022   Procedure: LEFT BREAST SEED GUIDED LUMPECTOMY AND LEFT AXILLARY SENTINEL NODE BIOPSY;  Surgeon: [name], MD;  Location: [facility];  Service: General;  Laterality: Left;  LMA & PEC BLOCK   PORTACATH PLACEMENT Right 10/06/2021   Procedure: INSERTION PORT-A-CATH;  Surgeon: [name], MD;  Location: [facility];  Service: General;  Laterality: Right;   [place]      I have reviewed the social history and family history with the patient and they are unchanged from previous note.  ALLERGIES:  has No Known Allergies.  MEDICATIONS:  Current Outpatient Medications  Medication Sig Dispense Refill   alendronate (FOSAMAX) 70 MG tablet Take 70 mg by mouth once a week. Take with a  full glass of water on an empty stomach.     Calcium Carbonate-Vitamin D 600-400 MG-UNIT tablet Take 1 tablet by mouth daily.       CRESTOR 10 MG tablet Take 5 mg by mouth Daily.     dexamethasone (DECADRON) 4 MG tablet Take 1 tablet (4 mg total) by mouth daily. Take 1 tab twice daily the day  before Taxotere. Then take 1 tab daily x 3 days after chemotherapy. 30 tablet 0   diphenoxylate-atropine (LOMOTIL) 2.5-0.025 MG tablet Take 2 tablets by mouth 4 (four) times daily as needed for diarrhea or loose stools (Maximum dose = 8 tablets/day). Maximum dose = 8 tablets/day 30 tablet 0   Glucosamine 750 MG TABS Take 750 mg by mouth daily.       hydrocortisone 2.5 % cream Apply 1 application. topically as needed.     Lactobacillus Rhamnosus, GG, (RA PROBIOTIC DIGESTIVE CARE) CAPS Take 1 capsule by mouth 3 (three) times a week.     lidocaine-prilocaine (EMLA) cream Apply to affected area once 30 g 3   metroNIDAZOLE (METROGEL) 0.75 % gel Apply 1 application. topically 2 (two) times daily.     Misc Natural Products (JOINT SUPPORT COMPLEX PO) Take by mouth daily. [place] Joint[employer]-Cartilage-Bone Triple Action Joint Health; Undenatured Type II Collagen     Multiple Vitamin (MULTI-VITAMIN) tablet Take 1 tablet by mouth daily.     ondansetron (ZOFRAN) 8 MG tablet Take 1 tablet (8 mg total) by mouth 2 (two) times daily as needed (Nausea or vomiting). Start on the third day after chemotherapy. 30 tablet 1   prochlorperazine (COMPAZINE) 10 MG tablet Take 1 tablet (10 mg total) by mouth every 6 (six) hours as needed (Nausea or vomiting). 30 tablet 1   traMADol (ULTRAM) 50 MG tablet Take 2 tablets (100 mg total) by mouth every 6 (six) hours as needed. 10 tablet 0   No current facility-administered medications for this visit.   Facility-Administered Medications Ordered in Other Visits  Medication Dose Route Frequency Provider Last Rate Last Admin   acetaminophen (TYLENOL) 325 MG tablet            diphenhydrAMINE (BENADRYL) 25 mg capsule            pertuzumab (PERJETA) 420 mg in sodium chloride 0.9 % 250 mL chemo infusion  420 mg Intravenous Once [name], MD       sodium chloride flush (NS) 0.9 % injection 10 mL  10 mL Intracatheter PRN [name], MD   10 mL at 02/10/22 1201    PHYSICAL  EXAMINATION: ECOG PERFORMANCE STATUS: 0 - Asymptomatic  Vitals:   02/10/22 0937  BP: (!) 140/88  Pulse: 78  Temp: 98.1 F (36.7 C)  SpO2: 98%   Filed Weights   02/10/22 0937  Weight: 119 lb 4.8 oz (54.1 kg)    GENERAL:alert, no distress and comfortable SKIN: no rash  EYES:  sclera clear LUNGS: normal breathing effort HEART:  no lower extremity edema NEURO: alert & oriented x 3 with fluent speech, no focal motor/sensory deficits PAC without erythema Breast exam deferred  LABORATORY DATA:  I have reviewed the data as listed    Latest Ref Rng & Units 02/10/2022    9:18 AM 01/20/2022    9:19 AM 12/30/2021    9:17 AM  CBC  WBC 4.0 - 10.5 K/uL 6.1  9.7  17.5   Hemoglobin 12.0 - 15.0 g/dL 12.1  11.4  11.3   Hematocrit 36.0 - 46.0 % 35.8  34.6  33.9   Platelets 150 - 400 K/uL 217  245  299         Latest Ref Rng & Units 02/10/2022    9:18 AM 01/20/2022    9:19 AM 12/30/2021    9:17 AM  CMP  Glucose 70 - 99 mg/dL 88  94  91   BUN 8 - 23 mg/dL '18  17  25   ' Creatinine 0.44 - 1.00 mg/dL 0.69  0.56  0.59   Sodium 135 - 145 mmol/L 138  140  141   Potassium 3.5 - 5.1 mmol/L 4.2  4.3  4.0   Chloride 98 - 111 mmol/L 106  108  107   CO2 22 - 32 mmol/L '28  28  28   ' Calcium 8.9 - 10.3 mg/dL 9.5  9.8  10.2   Total Protein 6.5 - 8.1 g/dL 5.9  5.7  6.1   Total Bilirubin 0.3 - 1.2 mg/dL 0.5  0.3  0.4   Alkaline Phos 38 - 126 U/L 42  53  61   AST 15 - 41 U/L '25  24  22   ' ALT 0 - 44 U/L '16  15  14       ' RADIOGRAPHIC STUDIES: I have personally reviewed the radiological images as listed and agreed with the findings in the report. No results found.   ASSESSMENT & PLAN: Katherine Donovan is a 76 y.o. post-menopausal female with    1. Malignant neoplasm of upper-outer quadrant of left breast, ILC, Stage IA, c(T1c, N0), ER+/PR-/HER2+, Grade 3  -found on screening mammogram. B/l MM and [place] on 09/01/21 showed a 2.9 cm left breast mass. Biopsy on 09/14/21 showed invasive lobular carcinoma,  grade 2-3. -breast MRI on 09/21/21 showed 3 cm UOQ left breast malignancy, negative for additional malignancy or adenopathy. -baseline echo on 09/29/21 showed EF of 60-65%, normal. We will monitor every 3 months. -for reference, left breast mass measured 3 cm on physical exam the first day of chemo. -she completed 5 cycles of neoadjuvant TCHP on 10/07/21 - 12/30/21; cycle 6 was held due to diarrhea and she proceeded with first dose maintenance Herceptin/Perjeta on 01/20/2022. Diarrhea resolved after stopping T/C. She tolerates herceptin/perjeta well. -she underwent breast MRI on 01/13/22 showing complete imaging response. -repeat echo 01/28/22 showed stable LVEF 60-65% with normal GLS. Ok to continue herceptin -She underwent left lumpectomy and SLNB by Dr. Donne Hazel on 02/04/2022, she recovered well with minimal pain and currently no issues.  Her postop appointment is already scheduled. She can discuss port removal. -I reviewed her surgical pathology which shows no residual carcinoma, negative lymph nodes (0/6) and clear margins. Now ypT0, ypN0.  She has achieved a complete pathologic response which is a good prognostic indicator, I reviewed with her today. -Given no residual disease on final surgical path, we will discontinue Perjeta and she will proceed with maintenance Herceptin to complete 1 year.  She is interested in the injection if insurance will cover. -She will proceed with rad onc consult with Dr. Lisbeth Renshaw on 03/11/2022 -We will see her back towards the end of radiation to discuss adjuvant endocrine therapy. -Case discussed with Dr. Burr Medico -f/up in 6 weeks   2. 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  She completed  5 cycles of chemo.  Neuropathy was not  mentioned today   3. Osteoporosis -Her most recent DEXA was 05/14/20 showing T-score of -3.6.  -she reports being on Fosamax consistently for the past 10 years. -She will request repeat DEXA at her wellness visit in September 2, to aid discussion on antiestrogen   4. Genetics -she has no family history of breast cancer. She notes melanoma in her mother and a metastatic cancer (unknown primary) in a paternal uncle. -testing on 09/23/21 was negative, with [facility] in [place]: -Labs and path reviewed -Proceed with maintenance herceptin, will see if insurance will approve injection -RT consult 9/7 -lab and f/up in 6 weeks -Discussed with Dr. Burr Medico -will request Dr. Julien Girt to repeat DEXA at September visit for anti-estrogen therapy baseline -cc note to breast cancer team   All questions were answered. The patient knows to call the clinic with any problems, questions or concerns. No barriers to learning was detected. I spent 20 minutes counseling the patient face to face. The total time spent in the appointment was 30 minutes and more than 50% was on counseling and review of test results and coordination of care.      Alla Feeling, NP 02/10/22

## 2022-02-10 ENCOUNTER — Inpatient Hospital Stay (HOSPITAL_BASED_OUTPATIENT_CLINIC_OR_DEPARTMENT_OTHER): Payer: Medicare Other | Admitting: Nurse Practitioner

## 2022-02-10 ENCOUNTER — Other Ambulatory Visit: Payer: Self-pay

## 2022-02-10 ENCOUNTER — Inpatient Hospital Stay: Payer: Medicare Other

## 2022-02-10 ENCOUNTER — Inpatient Hospital Stay: Payer: Medicare Other | Attending: Hematology

## 2022-02-10 ENCOUNTER — Encounter: Payer: Self-pay | Admitting: Nurse Practitioner

## 2022-02-10 ENCOUNTER — Other Ambulatory Visit: Payer: Self-pay | Admitting: Hematology

## 2022-02-10 VITALS — BP 140/88 | HR 78 | Temp 98.1°F | Wt 119.3 lb

## 2022-02-10 DIAGNOSIS — Z170 Estrogen receptor positive status [ER+]: Secondary | ICD-10-CM

## 2022-02-10 DIAGNOSIS — N6311 Unspecified lump in the right breast, upper outer quadrant: Secondary | ICD-10-CM | POA: Diagnosis not present

## 2022-02-10 DIAGNOSIS — C50412 Malignant neoplasm of upper-outer quadrant of left female breast: Secondary | ICD-10-CM

## 2022-02-10 DIAGNOSIS — Z79899 Other long term (current) drug therapy: Secondary | ICD-10-CM | POA: Diagnosis not present

## 2022-02-10 DIAGNOSIS — G629 Polyneuropathy, unspecified: Secondary | ICD-10-CM | POA: Diagnosis not present

## 2022-02-10 DIAGNOSIS — Z5112 Encounter for antineoplastic immunotherapy: Secondary | ICD-10-CM | POA: Insufficient documentation

## 2022-02-10 DIAGNOSIS — M810 Age-related osteoporosis without current pathological fracture: Secondary | ICD-10-CM | POA: Diagnosis not present

## 2022-02-10 DIAGNOSIS — Z9221 Personal history of antineoplastic chemotherapy: Secondary | ICD-10-CM | POA: Insufficient documentation

## 2022-02-10 DIAGNOSIS — Z808 Family history of malignant neoplasm of other organs or systems: Secondary | ICD-10-CM | POA: Insufficient documentation

## 2022-02-10 DIAGNOSIS — Z95828 Presence of other vascular implants and grafts: Secondary | ICD-10-CM

## 2022-02-10 DIAGNOSIS — Z7952 Long term (current) use of systemic steroids: Secondary | ICD-10-CM | POA: Diagnosis not present

## 2022-02-10 DIAGNOSIS — N6489 Other specified disorders of breast: Secondary | ICD-10-CM | POA: Insufficient documentation

## 2022-02-10 LAB — CMP (CANCER CENTER ONLY)
ALT: 16 U/L (ref 0–44)
AST: 25 U/L (ref 15–41)
Albumin: 3.8 g/dL (ref 3.5–5.0)
Alkaline Phosphatase: 42 U/L (ref 38–126)
Anion gap: 4 — ABNORMAL LOW (ref 5–15)
BUN: 18 mg/dL (ref 8–23)
CO2: 28 mmol/L (ref 22–32)
Calcium: 9.5 mg/dL (ref 8.9–10.3)
Chloride: 106 mmol/L (ref 98–111)
Creatinine: 0.69 mg/dL (ref 0.44–1.00)
GFR, Estimated: >60 mL/min (ref >60)
Glucose, Bld: 88 mg/dL (ref 70–99)
Potassium: 4.2 mmol/L (ref 3.5–5.1)
Sodium: 138 mmol/L (ref 135–145)
Total Bilirubin: 0.5 mg/dL (ref 0.3–1.2)
Total Protein: 5.9 g/dL — ABNORMAL LOW (ref 6.5–8.1)

## 2022-02-10 LAB — CBC WITH DIFFERENTIAL (CANCER CENTER ONLY)
Abs Immature Granulocytes: 0.01 10*3/uL (ref 0.00–0.07)
Basophils Absolute: 0 10*3/uL (ref 0.0–0.1)
Basophils Relative: 1 %
Eosinophils Absolute: 0.3 10*3/uL (ref 0.0–0.5)
Eosinophils Relative: 5 %
HCT: 35.8 % — ABNORMAL LOW (ref 36.0–46.0)
Hemoglobin: 12.1 g/dL (ref 12.0–15.0)
Immature Granulocytes: 0 %
Lymphocytes Relative: 15 %
Lymphs Abs: 0.9 10*3/uL (ref 0.7–4.0)
MCH: 31.9 pg (ref 26.0–34.0)
MCHC: 33.8 g/dL (ref 30.0–36.0)
MCV: 94.5 fL (ref 80.0–100.0)
Monocytes Absolute: 0.6 10*3/uL (ref 0.1–1.0)
Monocytes Relative: 10 %
Neutro Abs: 4.3 10*3/uL (ref 1.7–7.7)
Neutrophils Relative %: 69 %
Platelet Count: 217 10*3/uL (ref 150–400)
RBC: 3.79 MIL/uL — ABNORMAL LOW (ref 3.87–5.11)
RDW: 12.6 % (ref 11.5–15.5)
WBC Count: 6.1 10*3/uL (ref 4.0–10.5)
nRBC: 0 % (ref 0.0–0.2)

## 2022-02-10 MED ORDER — DIPHENHYDRAMINE HCL 25 MG PO CAPS
ORAL_CAPSULE | ORAL | Status: AC
  Filled 2022-02-10: qty 1

## 2022-02-10 MED ORDER — HEPARIN SOD (PORK) LOCK FLUSH 100 UNIT/ML IV SOLN
500.0000 [IU] | Freq: Once | INTRAVENOUS | Status: AC | PRN
  Administered 2022-02-10: 500 [IU]

## 2022-02-10 MED ORDER — TRASTUZUMAB-DKST CHEMO 150 MG IV SOLR
6.0000 mg/kg | Freq: Once | INTRAVENOUS | Status: AC
  Administered 2022-02-10: 336 mg via INTRAVENOUS
  Filled 2022-02-10: qty 16

## 2022-02-10 MED ORDER — ACETAMINOPHEN 325 MG PO TABS
ORAL_TABLET | ORAL | Status: AC
  Filled 2022-02-10: qty 2

## 2022-02-10 MED ORDER — SODIUM CHLORIDE 0.9 % IV SOLN: Freq: Once | INTRAVENOUS | Status: AC

## 2022-02-10 MED ORDER — ACETAMINOPHEN 325 MG PO TABS
650.0000 mg | ORAL_TABLET | Freq: Once | ORAL | Status: AC
  Administered 2022-02-10: 650 mg via ORAL

## 2022-02-10 MED ORDER — SODIUM CHLORIDE 0.9% FLUSH
10.0000 mL | INTRAVENOUS | Status: DC | PRN
  Administered 2022-02-10: 10 mL

## 2022-02-10 MED ORDER — SODIUM CHLORIDE 0.9% FLUSH
10.0000 mL | Freq: Once | INTRAVENOUS | Status: AC
  Administered 2022-02-10: 10 mL

## 2022-02-10 MED ORDER — SODIUM CHLORIDE 0.9 % IV SOLN
420.0000 mg | Freq: Once | INTRAVENOUS | Status: DC
  Filled 2022-02-10: qty 14

## 2022-02-10 MED ORDER — DIPHENHYDRAMINE HCL 25 MG PO CAPS
25.0000 mg | ORAL_CAPSULE | Freq: Once | ORAL | Status: AC
  Administered 2022-02-10: 25 mg via ORAL

## 2022-02-10 NOTE — Progress Notes (Signed)
Per Regan Rakers, we are holding perjeta today. Pharmacy notified.

## 2022-02-10 NOTE — Progress Notes (Signed)
DISCONTINUE ON PATHWAY REGIMEN - Breast     Cycle 1: A cycle is 21 days:     Pertuzumab      Trastuzumab-xxxx      Docetaxel      Carboplatin    Cycles 2 through 6: A cycle is every 21 days:     Pertuzumab      Trastuzumab-xxxx      Docetaxel      Carboplatin   **Always confirm dose/schedule in your pharmacy ordering system**  REASON: Other Reason PRIOR TREATMENT: [id number]: Docetaxel + Carboplatin + Trastuzumab IV + Pertuzumab IV (TCHP IV) q21 Days x 6 Cycles TREATMENT RESPONSE: Complete Response (CR)  START ON PATHWAY REGIMEN - Breast     A cycle is every 21 days:     Trastuzumab-xxxx   **Always confirm dose/schedule in your pharmacy ordering system**  Patient Characteristics: Post-Neoadjuvant Therapy and Resection, HER2 Positive, ER Positive, No Residual Disease, Adjuvant Targeted Therapy After Neoadjuvant Chemo/Targeted Therapy Therapeutic Status: Post-Neoadjuvant Therapy and Resection Residual Invasive Disease Post-Neoadjuvant Therapy<= No ER Status: Positive (+) HER2 Status: Positive (+) PR Status: Negative (-) Intent of Therapy: Curative Intent, Discussed with Patient

## 2022-02-10 NOTE — Patient Instructions (Signed)
[facility] MEDICAL ONCOLOGY   Discharge Instructions: Thank you for choosing [facility] to provide your oncology and hematology care.   If you have a lab appointment with the Cancer Center, please go directly to the Cancer Center and check in at the registration area.   Wear comfortable clothing and clothing appropriate for easy access to any Portacath or PICC line.   We strive to give you quality time with your provider. You may need to reschedule your appointment if you arrive late (15 or more minutes).  Arriving late affects you and other patients whose appointments are after yours.  Also, if you miss three or more appointments without notifying the office, you may be dismissed from the clinic at the provider's discretion.      For prescription refill requests, have your pharmacy contact our office and allow 72 hours for refills to be completed.    Today you received the following chemotherapy and/or immunotherapy agents: trastuzumab-dkst      To help prevent nausea and vomiting after your treatment, we encourage you to take your nausea medication as directed.  BELOW ARE SYMPTOMS THAT SHOULD BE REPORTED IMMEDIATELY: *FEVER GREATER THAN 100.4 F (38 C) OR HIGHER *CHILLS OR SWEATING *NAUSEA AND VOMITING THAT IS NOT CONTROLLED WITH YOUR NAUSEA MEDICATION *UNUSUAL SHORTNESS OF BREATH *UNUSUAL BRUISING OR BLEEDING *URINARY PROBLEMS (pain or burning when urinating, or frequent urination) *BOWEL PROBLEMS (unusual diarrhea, constipation, pain near the anus) TENDERNESS IN MOUTH AND THROAT WITH OR WITHOUT PRESENCE OF ULCERS (sore throat, sores in mouth, or a toothache) UNUSUAL RASH, SWELLING OR PAIN  UNUSUAL VAGINAL DISCHARGE OR ITCHING   Items with * indicate a potential emergency and should be followed up as soon as possible or go to the Emergency Department if any problems should occur.  Please show the CHEMOTHERAPY ALERT CARD or IMMUNOTHERAPY ALERT CARD at  check-in to the Emergency Department and triage nurse.  Should you have questions after your visit or need to cancel or reschedule your appointment, please contact [facility]  Dept: [phone]  and follow the prompts.  Office hours are 8:00 a.m. to 4:30 p.m. Monday - Friday. Please note that voicemails left after 4:00 p.m. may not be returned until the following business day.  We are closed weekends and major holidays. You have access to a nurse at all times for urgent questions. Please call the main number to the clinic Dept: [phone] and follow the prompts.   For any non-urgent questions, you may also contact your provider using MyChart. We now offer e-Visits for anyone 18 and older to request care online for non-urgent symptoms. For details visit [email or web address].   Also download the MyChart app! Go to the app store, search "MyChart", open the app, select [facility], and log in with your MyChart username and password.  Masks are optional in the cancer centers. If you would like for your care team to wear a mask while they are taking care of you, please let them know. You may have one support person who is at least 74 years old accompany you for your appointments. 

## 2022-02-11 ENCOUNTER — Encounter: Payer: Self-pay | Admitting: *Deleted

## 2022-02-11 ENCOUNTER — Telehealth: Payer: Self-pay | Admitting: Hematology

## 2022-02-11 NOTE — Telephone Encounter (Signed)
Left message with follow-up appointments per 8/9 los.

## 2022-02-12 ENCOUNTER — Other Ambulatory Visit: Payer: Self-pay

## 2022-02-17 ENCOUNTER — Encounter: Payer: Self-pay | Admitting: *Deleted

## 2022-02-25 ENCOUNTER — Encounter: Payer: Self-pay | Admitting: Rehabilitation

## 2022-02-25 ENCOUNTER — Encounter: Payer: Self-pay | Admitting: Nurse Practitioner

## 2022-02-25 NOTE — Therapy (Signed)
OUTPATIENT PHYSICAL THERAPY BREAST CANCER POST OP FOLLOW UP   Patient Name: Katherine Donovan MRN: [id number] DOB:11/17/1945, 76 y.o., female Today's Date: 02/26/2022   PT End of Session - 02/26/22 0923     Visit Number 2    Number of Visits 2    Date for PT Re-Evaluation 03/26/22    PT Start Time 0900    PT Stop Time 0920    PT Time Calculation (min) 20 min    Activity Tolerance Patient tolerated treatment well    Behavior During Therapy [facility] for tasks assessed/performed             Past Medical History:  Diagnosis Date   History of bunionectomy 09/02/2020   Hyperlipidemia    Hyperlipidemia    left breast ILC 09/15/21   Osteoporosis    Rosacea    to face   Past Surgical History:  Procedure Laterality Date   BREAST LUMPECTOMY WITH RADIOACTIVE SEED AND SENTINEL LYMPH NODE BIOPSY Left 02/04/2022   Procedure: LEFT BREAST SEED GUIDED LUMPECTOMY AND LEFT AXILLARY SENTINEL NODE BIOPSY;  Surgeon: [name], MD;  Location: [facility];  Service: General;  Laterality: Left;  LMA & PEC BLOCK   PORTACATH PLACEMENT Right 10/06/2021   Procedure: INSERTION PORT-A-CATH;  Surgeon: [name], MD;  Location: [place];  Service: General;  Laterality: Right;   [place]     Patient Active Problem List   Diagnosis Date Noted   Encounter for immunization 10/19/2021   Port-A-Cath in place 10/14/2021   Malignant neoplasm of upper-outer quadrant of left breast in female, estrogen receptor positive ([place]) 09/18/2021   Pure hypercholesterolemia 09/18/2015   Age-related osteoporosis with current pathological fracture with routine healing 09/18/2015   PCP: Nickola Major, MD   REFERRING PROVIDER: [name], MD   REFERRING DIAG: Left breast cancer   THERAPY DIAG:  Malignant neoplasm of upper-outer quadrant of left breast in female, estrogen receptor positive ([place])   Abnormal posture   ONSET DATE:  08/21/2021  Rationale for Evaluation and Treatment Rehabilitation  ONSET DATE: 08/21/21  SUBJECTIVE:                                                                                                                                                                                           SUBJECTIVE STATEMENT: I am doing wonderful. Denies swelling.  I have my compression bra.    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 It measures 3 cm and is located in the upper outer quadrant. It is  ER positive, PR negative and HER2 positive with a Ki67 of 15%. Neoadjuvant chemotherapy TCHP. Left lumpectomy on 02/04/22 with 6 negative lymph nodes. Will be having radiation. Has osteoporosis.    PATIENT GOALS:  Reassess how my recovery is going related to arm function, pain, and swelling.  PAIN:  Are you having pain? No - I have a tingle  PRECAUTIONS: Recent Surgery, left UE Lymphedema risk  ACTIVITY LEVEL / LEISURE: I already have returned    OBJECTIVE:   PATIENT SURVEYS:  QUICK DASH: 9%  OBSERVATIONS:  1 healed incision in in lateral breast   POSTURE:  Forward shoulders   LYMPHEDEMA ASSESSMENT:   UPPER EXTREMITY AROM/PROM:   A/PROM RIGHT  09/23/2021    Shoulder extension 63  Shoulder flexion 162  Shoulder abduction 170  Shoulder internal rotation 83  Shoulder external rotation 82                          (Blank rows = not tested)   A/PROM LEFT  09/23/2021 02/26/22  Shoulder extension 69 65  Shoulder flexion 152 152  Shoulder abduction 158 165  Shoulder internal rotation 69   Shoulder external rotation 89 92                          (Blank rows = not tested)     LYMPHEDEMA ASSESSMENTS:    LANDMARK RIGHT  09/23/2021  10 cm proximal to olecranon process 23.5  Olecranon process 19.7  10 cm proximal to ulnar styloid process 16.1  Just proximal to ulnar styloid process 13.1  Across hand at thumb web space  17  At base of 2nd digit 5.3  (Blank rows = not tested)   [facility] LEFT  09/23/2021 02/26/22  10 cm proximal to olecranon process 23.4 23.5  Olecranon process _0 cm proximal to ulnar styloid process 15.7 15.7  Just proximal to ulnar styloid process 13 13  Across hand at thumb web space 16.5 16.3  At base of 2nd digit 5.3 5.4  (Blank rows = not tested)    PATIENT EDUCATION:  Education details: per below Person educated: Patient Education method: Theatre stage manager Education comprehension: verbalized understanding  HOME EXERCISE PROGRAM:  Reviewed previously given post op HEP.   ASSESSMENT:  CLINICAL IMPRESSION: Pt is doing very well.  Return to full ROM and activity level.  Pt is signed up for ABC class and sozo screenings.    Pt will benefit from skilled therapeutic intervention to improve on the following deficits: Decreased knowledge of precautions, impaired UE functional use, pain, decreased ROM, postural dysfunction.   PT treatment/interventions: ADL/Self care home management, Patient/Family education and Re-evaluation     GOALS: Goals reviewed with patient? Yes  LONG TERM GOALS:  (STG=LTG)  GOALS Name Target Date  Goal status  1 Pt will demonstrate she has regained full shoulder ROM and function post operatively compared to baselines.  Baseline: 02/26/22 MET                    PLAN: PT FREQUENCY/DURATION: SOZO only  PLAN FOR NEXT SESSION: SOZO only   [facility]  [address], [address]  [place] [address]  [phone]  After Breast Cancer Class It is recommended you attend the ABC class to be educated on lymphedema risk reduction. This class is free of charge and lasts for 1 hour. It is a  1-time class. You will need to download the Webex app either on your phone or computer. We will send you a link the night before or the morning of the class. You should be able to click on that link to join the class. This is not  a confidential class. You don't have to turn your camera on, but other participants may be able to see your email address.  Scar massage You can begin gentle scar massage to you incision sites. Gently place one hand on the incision and move the skin (without sliding on the skin) in various directions. Do this for a few minutes and then you can gently massage either coconut oil or vitamin E cream into the scars.  Compression garment You should continue wearing your compression bra until you feel like you no longer have swelling.  Home exercise Program Continue doing the exercises you were given until you feel like you can do them without feeling any tightness at the end.   Walking Program Studies show that 30 minutes of walking per day (fast enough to elevate your heart rate) can significantly reduce the risk of a cancer recurrence. If you can't walk due to other medical reasons, we encourage you to find another activity you could do (like a stationary bike or water exercise).  Posture After breast cancer surgery, people frequently sit with rounded shoulders posture because it puts their incisions on slack and feels better. If you sit like this and scar tissue forms in that position, you can become very tight and have pain sitting or standing with good posture. Try to be aware of your posture and sit and stand up tall to heal properly.  Follow up PT: It is recommended you return every 3 months for the first 3 years following surgery to be assessed on the SOZO machine for an L-Dex score. This helps prevent clinically significant lymphedema in 95% of patients. These follow up screens are 10 minute appointments that you are not billed for.  Stark Bray, PT 02/26/2022, 9:23 AM

## 2022-02-25 NOTE — Therapy (Deleted)
OUTPATIENT PHYSICAL THERAPY BREAST CANCER POST OP FOLLOW UP   Patient Name: Katherine Donovan MRN: [id number] DOB:07/29/1945, 76 y.o., female Today's Date: 02/25/2022    Past Medical History:  Diagnosis Date   History of bunionectomy 09/02/2020   Hyperlipidemia    Hyperlipidemia    left breast ILC 09/15/21   Osteoporosis    Rosacea    to face   Past Surgical History:  Procedure Laterality Date   BREAST LUMPECTOMY WITH RADIOACTIVE SEED AND SENTINEL LYMPH NODE BIOPSY Left 02/04/2022   Procedure: LEFT BREAST SEED GUIDED LUMPECTOMY AND LEFT AXILLARY SENTINEL NODE BIOPSY;  Surgeon: [place], Matthew, MD;  Location: [facility];  Service: General;  Laterality: Left;  LMA & PEC BLOCK   PORTACATH PLACEMENT Right 10/06/2021   Procedure: INSERTION PORT-A-CATH;  Surgeon: [place], Matthew, MD;  Location: [facility];  Service: General;  Laterality: Right;   SIGMOIDOSCOPY  1999   TUBAL LIGATION     Patient Active Problem List   Diagnosis Date Noted   Encounter for immunization 10/19/2021   Port-A-Cath in place 10/14/2021   Malignant neoplasm of upper-outer quadrant of left breast in female, estrogen receptor positive ([facility]) 09/18/2021   Pure hypercholesterolemia 09/18/2015   Age-related osteoporosis with current pathological fracture with routine healing 09/18/2015   PCP: Eksir, Samantha A, MD   REFERRING PROVIDER: [place], Matthew, MD   REFERRING DIAG: Left breast cancer   THERAPY DIAG:  Malignant neoplasm of upper-outer quadrant of left breast in female, estrogen receptor positive ([facility])   Abnormal posture   ONSET DATE: 08/21/2021  Rationale for Evaluation and Treatment Rehabilitation  ONSET DATE: 08/21/21  SUBJECTIVE:                                                                                                                                                                                           SUBJECTIVE STATEMENT: ***  PERTINENT HISTORY:   Patient was diagnosed on 08/21/2021 with 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 It measures 3 cm and is located in the upper outer quadrant. It is ER positive, PR negative and HER2 positive with a Ki67 of 15%. Neoadjuvant chemotherapy TCHP. Left lumpectomy on 02/04/22 with 6 negative lymph nodes. Will be having radiation. Has osteoporosis.    PATIENT GOALS:  Reassess how my recovery is going related to arm function, pain, and swelling.  PAIN:  Are you having pain? {OPRCPAIN:27236}  PRECAUTIONS: Recent Surgery, {RIGHT/LEFT:21944} UE Lymphedema risk, {Therapy precautions:24002}  ACTIVITY LEVEL / LEISURE: ***   OBJECTIVE:   PATIENT SURVEYS:  QUICK DASH: ***  OBSERVATIONS:  ***  POSTURE:  ***  LYMPHEDEMA ASSESSMENT:     UPPER EXTREMITY AROM/PROM:   A/PROM RIGHT  09/23/2021    Shoulder extension 63  Shoulder flexion 162  Shoulder abduction 170  Shoulder internal rotation 83  Shoulder external rotation 82                          (Blank rows = not tested)   A/PROM LEFT  09/23/2021  Shoulder extension 69  Shoulder flexion 152  Shoulder abduction 158  Shoulder internal rotation 69  Shoulder external rotation 89                          (Blank rows = not tested)     CERVICAL AROM: All within normal limits     UPPER EXTREMITY STRENGTH: WNL     LYMPHEDEMA ASSESSMENTS:    LANDMARK RIGHT  09/23/2021  10 cm proximal to olecranon process 23.5  Olecranon process 19.7  10 cm proximal to ulnar styloid process 16.1  Just proximal to ulnar styloid process 13.1  Across hand at thumb web space 17  At base of 2nd digit 5.3  (Blank rows = not tested)   LANDMARK LEFT  09/23/2021  10 cm proximal to olecranon process 23.4  Olecranon process 20  10 cm proximal to ulnar styloid process 15.7  Just proximal to ulnar styloid process 13  Across hand at thumb web space 16.5  At base of 2nd digit 5.3  (Blank rows = not tested)        Surgery type/Date:  *** Number of lymph nodes removed: *** Current/past treatment (chemo, radiation, hormone therapy): *** Other symptoms:  Heaviness/tightness {yes/no:20286} Pain {yes/no:20286} Pitting edema {yes/no:20286} Infections {yes/no:20286} Decreased scar mobility {yes/no:20286} Stemmer sign {yes/no:20286}   PATIENT EDUCATION:  Education details: *** Person educated: {Person educated:25204} Education method: {Education Method:25205} Education comprehension: {Education Comprehension:25206}   HOME EXERCISE PROGRAM:  Reviewed previously given post op HEP. ***  ASSESSMENT:  CLINICAL IMPRESSION: ***  Pt will benefit from skilled therapeutic intervention to improve on the following deficits: Decreased knowledge of precautions, impaired UE functional use, pain, decreased ROM, postural dysfunction.   PT treatment/interventions: ADL/Self care home management, {rehab planned interventions:25118::"Therapeutic exercises","Therapeutic activity","Neuromuscular re-education","Balance training","Gait training","Patient/Family education","Self Care","Joint mobilization"}     GOALS: Goals reviewed with patient? {yes/no:20286}  LONG TERM GOALS:  (STG=LTG)  GOALS Name Target Date  Goal status  1 Pt will demonstrate she has regained full shoulder ROM and function post operatively compared to baselines.  Baseline: {follow up:25551} {GOALSTATUS:25110}  2  {follow up:25551} {GOALSTATUS:25110}  3  {follow up:25551} {GOALSTATUS:25110}  4  {follow up:25551} {GOALSTATUS:25110}     PLAN: PT FREQUENCY/DURATION: ***  PLAN FOR NEXT SESSION: ***   [facility]  [address], [address]  [place] [address]  [phone]  After Breast Cancer Class It is recommended you attend the ABC class to be educated on lymphedema risk reduction. This class is free of charge and lasts for 1 hour. It is a 1-time class. You will need to download the Webex app either on your phone or computer.  We will send you a link the night before or the morning of the class. You should be able to click on that link to join the class. This is not a confidential class. You don't have to turn your camera on, but other participants may be able to see your email address.  Scar massage You can begin gentle scar massage to you incision sites.   Gently place one hand on the incision and move the skin (without sliding on the skin) in various directions. Do this for a few minutes and then you can gently massage either coconut oil or vitamin E cream into the scars.  Compression garment You should continue wearing your compression bra until you feel like you no longer have swelling.  Home exercise Program Continue doing the exercises you were given until you feel like you can do them without feeling any tightness at the end.   Walking Program Studies show that 30 minutes of walking per day (fast enough to elevate your heart rate) can significantly reduce the risk of a cancer recurrence. If you can't walk due to other medical reasons, we encourage you to find another activity you could do (like a stationary bike or water exercise).  Posture After breast cancer surgery, people frequently sit with rounded shoulders posture because it puts their incisions on slack and feels better. If you sit like this and scar tissue forms in that position, you can become very tight and have pain sitting or standing with good posture. Try to be aware of your posture and sit and stand up tall to heal properly.  Follow up PT: It is recommended you return every 3 months for the first 3 years following surgery to be assessed on the SOZO machine for an L-Dex score. This helps prevent clinically significant lymphedema in 95% of patients. These follow up screens are 10 minute appointments that you are not billed for.  Stark Bray, PT 02/25/2022, 1:10 PM

## 2022-02-26 ENCOUNTER — Encounter: Payer: Self-pay | Admitting: Rehabilitation

## 2022-02-26 ENCOUNTER — Ambulatory Visit: Payer: Medicare Other | Attending: General Surgery | Admitting: Rehabilitation

## 2022-02-26 DIAGNOSIS — Z170 Estrogen receptor positive status [ER+]: Secondary | ICD-10-CM | POA: Diagnosis present

## 2022-02-26 DIAGNOSIS — C50412 Malignant neoplasm of upper-outer quadrant of left female breast: Secondary | ICD-10-CM | POA: Diagnosis present

## 2022-02-26 DIAGNOSIS — R293 Abnormal posture: Secondary | ICD-10-CM | POA: Insufficient documentation

## 2022-02-26 DIAGNOSIS — Z483 Aftercare following surgery for neoplasm: Secondary | ICD-10-CM | POA: Insufficient documentation

## 2022-02-26 NOTE — Patient Instructions (Signed)
     [facility]  [address], [address]  [place] [address]  [phone]  After Breast Cancer Class It is recommended you attend the ABC class to be educated on lymphedema risk reduction. This class is free of charge and lasts for 1 hour. It is a 1-time class. You will need to download the Webex app either on your phone or computer. We will send you a link the night before or the morning of the class. You should be able to click on that link to join the class. This is not a confidential class. You don't have to turn your camera on, but other participants may be able to see your email address.  Scar massage You can begin gentle scar massage to you incision sites. Gently place one hand on the incision and move the skin (without sliding on the skin) in various directions. Do this for a few minutes and then you can gently massage either coconut oil or vitamin E cream into the scars.  Compression garment You should continue wearing your compression bra until you feel like you no longer have swelling.  Home exercise Program Continue doing the exercises you were given until you feel like you can do them without feeling any tightness at the end.   Walking Program Studies show that 30 minutes of walking per day (fast enough to elevate your heart rate) can significantly reduce the risk of a cancer recurrence. If you can't walk due to other medical reasons, we encourage you to find another activity you could do (like a stationary bike or water exercise).  Posture After breast cancer surgery, people frequently sit with rounded shoulders posture because it puts their incisions on slack and feels better. If you sit like this and scar tissue forms in that position, you can become very tight and have pain sitting or standing with good posture. Try to be aware of your posture and sit and stand up tall to heal properly.  Follow up PT: It is recommended you return every 3 months for  the first 2 years following surgery to be assessed on the SOZO machine for an L-Dex score. This helps prevent clinically significant lymphedema in 95% of patients. These follow up screens are 10 minute appointments that you are not billed for.  

## 2022-02-27 ENCOUNTER — Other Ambulatory Visit: Payer: Self-pay

## 2022-03-03 ENCOUNTER — Encounter: Payer: Self-pay | Admitting: *Deleted

## 2022-03-03 ENCOUNTER — Other Ambulatory Visit: Payer: Self-pay

## 2022-03-03 ENCOUNTER — Other Ambulatory Visit: Payer: Self-pay | Admitting: Hematology

## 2022-03-03 ENCOUNTER — Inpatient Hospital Stay: Payer: Medicare Other

## 2022-03-03 VITALS — BP 132/78 | HR 64 | Temp 98.0°F | Resp 16 | Wt 117.0 lb

## 2022-03-03 DIAGNOSIS — Z170 Estrogen receptor positive status [ER+]: Secondary | ICD-10-CM

## 2022-03-03 DIAGNOSIS — C50412 Malignant neoplasm of upper-outer quadrant of left female breast: Secondary | ICD-10-CM | POA: Diagnosis not present

## 2022-03-03 MED ORDER — SODIUM CHLORIDE 0.9 % IV SOLN: Freq: Once | INTRAVENOUS | Status: AC

## 2022-03-03 MED ORDER — DIPHENHYDRAMINE HCL 25 MG PO CAPS
25.0000 mg | ORAL_CAPSULE | Freq: Once | ORAL | Status: AC
  Administered 2022-03-03: 25 mg via ORAL
  Filled 2022-03-03: qty 1

## 2022-03-03 MED ORDER — HEPARIN SOD (PORK) LOCK FLUSH 100 UNIT/ML IV SOLN
500.0000 [IU] | Freq: Once | INTRAVENOUS | Status: AC | PRN
  Administered 2022-03-03: 500 [IU]

## 2022-03-03 MED ORDER — SODIUM CHLORIDE 0.9% FLUSH
10.0000 mL | INTRAVENOUS | Status: DC | PRN
  Administered 2022-03-03: 10 mL

## 2022-03-03 MED ORDER — ACETAMINOPHEN 325 MG PO TABS
650.0000 mg | ORAL_TABLET | Freq: Once | ORAL | Status: AC
  Administered 2022-03-03: 650 mg via ORAL
  Filled 2022-03-03: qty 2

## 2022-03-03 MED ORDER — TRASTUZUMAB-DKST CHEMO 150 MG IV SOLR
6.0000 mg/kg | Freq: Once | INTRAVENOUS | Status: AC
  Administered 2022-03-03: 315 mg via INTRAVENOUS
  Filled 2022-03-03: qty 15

## 2022-03-03 NOTE — Patient Instructions (Signed)
[facility] MEDICAL ONCOLOGY   Discharge Instructions: Thank you for choosing [facility] to provide your oncology and hematology care.   If you have a lab appointment with the Cancer Center, please go directly to the Cancer Center and check in at the registration area.   Wear comfortable clothing and clothing appropriate for easy access to any Portacath or PICC line.   We strive to give you quality time with your provider. You may need to reschedule your appointment if you arrive late (15 or more minutes).  Arriving late affects you and other patients whose appointments are after yours.  Also, if you miss three or more appointments without notifying the office, you may be dismissed from the clinic at the provider's discretion.      For prescription refill requests, have your pharmacy contact our office and allow 72 hours for refills to be completed.    Today you received the following chemotherapy and/or immunotherapy agents: [id number]      To help prevent nausea and vomiting after your treatment, we encourage you to take your nausea medication as directed.  BELOW ARE SYMPTOMS THAT SHOULD BE REPORTED IMMEDIATELY: *FEVER GREATER THAN 100.4 F (38 C) OR HIGHER *CHILLS OR SWEATING *NAUSEA AND VOMITING THAT IS NOT CONTROLLED WITH YOUR NAUSEA MEDICATION *UNUSUAL SHORTNESS OF BREATH *UNUSUAL BRUISING OR BLEEDING *URINARY PROBLEMS (pain or burning when urinating, or frequent urination) *BOWEL PROBLEMS (unusual diarrhea, constipation, pain near the anus) TENDERNESS IN MOUTH AND THROAT WITH OR WITHOUT PRESENCE OF ULCERS (sore throat, sores in mouth, or a toothache) UNUSUAL RASH, SWELLING OR PAIN  UNUSUAL VAGINAL DISCHARGE OR ITCHING   Items with * indicate a potential emergency and should be followed up as soon as possible or go to the Emergency Department if any problems should occur.  Please show the CHEMOTHERAPY ALERT CARD or IMMUNOTHERAPY ALERT CARD at  check-in to the Emergency Department and triage nurse.  Should you have questions after your visit or need to cancel or reschedule your appointment, please contact [facility] MEDICAL ONCOLOGY  Dept: [phone]  and follow the prompts.  Office hours are 8:00 a.m. to 4:30 p.m. Monday - Friday. Please note that voicemails left after 4:00 p.m. may not be returned until the following business day.  We are closed weekends and major holidays. You have access to a nurse at all times for urgent questions. Please call the main number to the clinic Dept: [phone] and follow the prompts.   For any non-urgent questions, you may also contact your provider using MyChart. We now offer e-Visits for anyone 18 and older to request care online for non-urgent symptoms. For details visit [email or web address].   Also download the MyChart app! Go to the app store, search "MyChart", open the app, select , and log in with your MyChart username and password.  Masks are optional in the cancer centers. If you would like for your care team to wear a mask while they are taking care of you, please let them know. You may have one support person who is at least 74 years old accompany you for your appointments. 

## 2022-03-04 NOTE — Progress Notes (Signed)
Radiation Oncology         [phone] ________________________________  Name: Katherine Donovan        MRN: [id number]  Date of Service: 03/11/2022 DOB: 08-12-1945  FT:DDUKG, Katherine Conroy, MD  [name], MD     REFERRING PHYSICIAN: [name], MD   DIAGNOSIS: The encounter diagnosis was Malignant neoplasm of upper-outer quadrant of left breast in female, estrogen receptor positive ([place]).   HISTORY OF PRESENT ILLNESS: Katherine Donovan is a 76 y.o. female originally seen in the multidisciplinary breast clinic for a new diagnosis of left breast cancer. The patient was noted to have a screening detected mass in the left breast as well as a right breast asymmetry, further assessment of the right breast was consistent with fibroglandular change.  In her left breast however at 1:00 there was a 2.9 cm mass, there were tendrils of the tumor associated in this area in total measuring up to 3.8 cm.  Her axilla was negative for adenopathy.  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    Since her last visit, the patient received chemotherapy and antiHER2 therapy between 10/07/21, and 12/30/21 with continuation of antiHER2 therapy. She had interval imaging on 01/13/22 showed complete response to therapy with no mass or enhancement and no new disease noted. She underwent a left lumpectomy with sentinel node biopsy on 02/04/22 showed hyaline fibrosis and focal calcifications with no residual disease, and 6 sampled nodes that were also negative for disease, she's seen to discuss adjuvant radiotherapy.   PREVIOUS RADIATION THERAPY: No   PAST MEDICAL HISTORY:  Past Medical History:  Diagnosis Date   History of bunionectomy 09/02/2020   Hyperlipidemia    Hyperlipidemia    left breast ILC 09/15/21   Osteoporosis     Rosacea    to face       PAST SURGICAL HISTORY: Past Surgical History:  Procedure Laterality Date   BREAST LUMPECTOMY WITH RADIOACTIVE SEED AND SENTINEL LYMPH NODE BIOPSY Left 02/04/2022   Procedure: LEFT BREAST SEED GUIDED LUMPECTOMY AND LEFT AXILLARY SENTINEL NODE BIOPSY;  Surgeon: Katherine Bookbinder, MD;  Location: [facility];  Service: General;  Laterality: Left;  LMA & PEC BLOCK   PORTACATH PLACEMENT Right 10/06/2021   Procedure: INSERTION PORT-A-CATH;  Surgeon: Katherine Bookbinder, MD;  Location: [facility];  Service: General;  Laterality: Right;   SIGMOIDOSCOPY  1999   TUBAL LIGATION       FAMILY HISTORY:  Family History  Problem Relation Age of Onset   Melanoma Mother        or other skin cancer; dx after 70; no systemic therapy needed   Hypertension Father    Heart attack Father    Other Sister 2       benign breast mass   Obesity Sister    Other Sister 96       benign breast mass   Cancer Paternal Uncle        unknown type; mets; d. late 20s   Colon cancer Neg Hx      SOCIAL HISTORY:  reports that she has quit smoking. Her smoking use included cigarettes. She has never used smokeless tobacco. She reports current alcohol use of about 3.0 - 5.0 standard drinks of alcohol per week. She reports that she  does not use drugs. The patient is married and lives in [place]. 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 She's originally from [place]. She enjoys traveling, cooking, and walking each day.    ALLERGIES: Patient has no known allergies.   MEDICATIONS:  Current Outpatient Medications  Medication Sig Dispense Refill   alendronate (FOSAMAX) 70 MG tablet Take 70 mg by mouth once a week. Take with a full glass of water on an empty stomach.     Calcium Carbonate-Vitamin D 600-400 MG-UNIT tablet Take 1 tablet by mouth daily.       CRESTOR  10 MG tablet Take 5 mg by mouth Daily.     Glucosamine 750 MG TABS Take 750 mg by mouth daily.       Lactobacillus Rhamnosus, GG, (RA PROBIOTIC DIGESTIVE CARE) CAPS Take 1 capsule by mouth 3 (three) times a week.     Misc Natural Products (JOINT SUPPORT COMPLEX PO) Take by mouth daily. [place] Joints-Cartilage-Bone Triple Action Joint Health; Undenatured Type II Collagen     Multiple Vitamin (MULTI-VITAMIN) tablet Take 1 tablet by mouth daily.     No current facility-administered medications for this encounter.     REVIEW OF SYSTEMS: On review of systems, the patient reports that she is doing well since her surgery. She reports her scalp is cold and fingertips are still numb, but otherwise she's feeling well. Her BP was repeated and was persistently elevated but she denies chest pain, shortness of breath, changes in speech or headache. No other complaints are verbalized.      PHYSICAL EXAM:  Wt Readings from Last 3 Encounters:  03/11/22 112 lb (50.8 kg)  03/03/22 117 lb (53.1 kg)  02/10/22 119 lb 4.8 oz (54.1 kg)   Temp Readings from Last 3 Encounters:  03/11/22 97.9 F (36.6 C) (Oral)  03/03/22 98 F (36.7 C) (Oral)  02/10/22 98.1 F (36.7 C) (Oral)   BP Readings from Last 3 Encounters:  03/11/22 (!) 157/93  03/03/22 132/78  02/10/22 (!) 140/88   Pulse Readings from Last 3 Encounters:  03/11/22 64  03/03/22 64  02/10/22 78    In general this is a well appearing caucasian female in no acute distress. She's alert and oriented x4 and appropriate throughout the examination. Cardiopulmonary assessment is negative for acute distress and she exhibits normal effort. The left breast incision in the upper outer quadrant was intact without erythema, separation, or drainage.     ECOG = 0  0 - Asymptomatic (Fully active, able to carry on all predisease activities without restriction)  1 - Symptomatic but completely ambulatory (Restricted in physically strenuous activity but  ambulatory and able to carry out work of a light or sedentary nature. For example, light housework, office work)  2 - Symptomatic, <50% in bed during the day (Ambulatory and capable of all self care but unable to carry out any work activities. Up and about more than 50% of waking hours)  3 - Symptomatic, >50% in bed, but not bedbound (Capable of only limited self-care, confined to bed or chair 50% or more of waking hours)  4 - Bedbound (Completely disabled. Cannot carry on any self-care. Totally confined to bed or chair)  5 - Death   Eustace Pen MM, Creech RH, Tormey DC, et al. [phone]). "Toxicity and response criteria of the [facility]". [place] Oncol. 5 (6): 649-55    LABORATORY DATA:  Lab Results  Component Value Date   WBC 6.1 02/10/2022   HGB 12.1 02/10/2022   HCT 35.8 (L) 02/10/2022   MCV 94.5 02/10/2022   PLT 217 02/10/2022   Lab Results  Component Value Date   NA 138 02/10/2022   K 4.2 02/10/2022   CL 106 02/10/2022   CO2 28 02/10/2022   Lab Results  Component Value Date   ALT 16 02/10/2022   AST 25 02/10/2022   ALKPHOS 42 02/10/2022   BILITOT 0.5 02/10/2022      RADIOGRAPHY: No results found.     IMPRESSION/PLAN: 1. Stage IA, cT1cN0M0 grade 3, ER positive, HER2 amplfied invasive lobular carcinoma of the left breast with complete response to systemic therapy. Dr. Lisbeth Renshaw has reviewed the patient's final pathology findings and I reviewed the nature of left breast disease. She has done well since completing surgery and systemic chemotherapy. She would benefit from external radiotherapy to the breast  to reduce risks of local recurrence followed by antiestrogen therapy. We discussed the risks, benefits, short, and long term effects of radiotherapy, as well as the curative intent, and the patient is interested in proceeding. We reviewed the delivery and logistics of radiotherapy and that Dr. Lisbeth Renshaw recommends 4 weeks of radiotherapy to the left breast  with deep inspiration breath hold technique. Written consent is obtained and placed in the chart, a copy was provided to the patient. She will simulate today. 2. Elevated BP. She will check her blood pressure at home today and report any persistent elevations to her PCP. We will follow this expectantly.   In a visit lasting 45 minutes, greater than 50% of the time was spent face to face reviewing her case, as well as in preparation of, discussing, and coordinating the patient's care.     Carola Rhine, [facility]    **Disclaimer: This note was dictated with voice recognition software. Similar sounding words can inadvertently be transcribed and this note may contain transcription errors which may not have been corrected upon publication of note.**

## 2022-03-10 ENCOUNTER — Encounter (HOSPITAL_COMMUNITY): Payer: Self-pay

## 2022-03-11 ENCOUNTER — Other Ambulatory Visit: Payer: Self-pay

## 2022-03-11 ENCOUNTER — Ambulatory Visit
Admission: RE | Admit: 2022-03-11 | Discharge: 2022-03-11 | Disposition: A | Discharge status: Home / Self Care | Payer: Medicare Other | Source: Ambulatory Visit | Attending: Radiation Oncology | Admitting: Radiation Oncology

## 2022-03-11 ENCOUNTER — Encounter: Payer: Self-pay | Admitting: Radiation Oncology

## 2022-03-11 VITALS — BP 157/93 | HR 64 | Temp 97.9°F | Resp 19 | Ht 61.0 in | Wt 112.0 lb

## 2022-03-11 DIAGNOSIS — Z170 Estrogen receptor positive status [ER+]: Secondary | ICD-10-CM | POA: Insufficient documentation

## 2022-03-11 DIAGNOSIS — C50412 Malignant neoplasm of upper-outer quadrant of left female breast: Secondary | ICD-10-CM | POA: Insufficient documentation

## 2022-03-11 DIAGNOSIS — E785 Hyperlipidemia, unspecified: Secondary | ICD-10-CM | POA: Insufficient documentation

## 2022-03-11 DIAGNOSIS — Z79899 Other long term (current) drug therapy: Secondary | ICD-10-CM | POA: Insufficient documentation

## 2022-03-11 DIAGNOSIS — Z510 Encounter for antineoplastic radiation therapy: Secondary | ICD-10-CM | POA: Insufficient documentation

## 2022-03-11 DIAGNOSIS — R030 Elevated blood-pressure reading, without diagnosis of hypertension: Secondary | ICD-10-CM | POA: Insufficient documentation

## 2022-03-11 DIAGNOSIS — Z5112 Encounter for antineoplastic immunotherapy: Secondary | ICD-10-CM | POA: Insufficient documentation

## 2022-03-11 DIAGNOSIS — M810 Age-related osteoporosis without current pathological fracture: Secondary | ICD-10-CM | POA: Insufficient documentation

## 2022-03-11 DIAGNOSIS — Z87891 Personal history of nicotine dependence: Secondary | ICD-10-CM | POA: Insufficient documentation

## 2022-03-11 NOTE — Progress Notes (Addendum)
Consult appointment. I verified patient's identity and began nursing interview. Patient states "She is having some moderate diarrhea due to her infusion medications." No LT breast discomfort/ issues reported at this time.  Meaningful use complete. Postmenopausal  BP (!) 157/88 (BP Location: Right Arm, Patient Position: Sitting, Cuff Size: Normal)   Pulse 72   Temp 97.9 F (36.6 C) (Oral)   Resp 19   Ht '5\' 1"'$  (1.549 m)   Wt 112 lb (50.8 kg)   SpO2 100%   BMI 21.16 kg/m

## 2022-03-17 DIAGNOSIS — Z5112 Encounter for antineoplastic immunotherapy: Secondary | ICD-10-CM | POA: Diagnosis not present

## 2022-03-18 ENCOUNTER — Encounter: Payer: Self-pay | Admitting: *Deleted

## 2022-03-23 ENCOUNTER — Ambulatory Visit
Admission: RE | Admit: 2022-03-23 | Discharge: 2022-03-23 | Disposition: A | Discharge status: Home / Self Care | Payer: Medicare Other | Source: Ambulatory Visit | Attending: Radiation Oncology | Admitting: Radiation Oncology

## 2022-03-23 ENCOUNTER — Other Ambulatory Visit: Payer: Self-pay

## 2022-03-23 DIAGNOSIS — Z5112 Encounter for antineoplastic immunotherapy: Secondary | ICD-10-CM | POA: Diagnosis not present

## 2022-03-23 LAB — RAD ONC ARIA SESSION SUMMARY
Course Elapsed Days: 0
Plan Fractions Treated to Date: 1
Plan Prescribed Dose Per Fraction: 2.66 Gy
Plan Total Fractions Prescribed: 16
Plan Total Prescribed Dose: 42.56 Gy
Reference Point Dosage Given to Date: 2.66 Gy
Reference Point Session Dosage Given: 2.66 Gy
Session Number: 1

## 2022-03-24 ENCOUNTER — Other Ambulatory Visit: Payer: Self-pay

## 2022-03-24 ENCOUNTER — Inpatient Hospital Stay: Payer: Medicare Other | Attending: Hematology

## 2022-03-24 ENCOUNTER — Ambulatory Visit
Admission: RE | Admit: 2022-03-24 | Discharge: 2022-03-24 | Disposition: A | Discharge status: Home / Self Care | Payer: Medicare Other | Source: Ambulatory Visit | Attending: Radiation Oncology | Admitting: Radiation Oncology

## 2022-03-24 ENCOUNTER — Other Ambulatory Visit: Payer: Self-pay | Admitting: Family Medicine

## 2022-03-24 VITALS — BP 141/78 | HR 61 | Temp 97.7°F | Resp 18 | Wt 114.1 lb

## 2022-03-24 DIAGNOSIS — Z5112 Encounter for antineoplastic immunotherapy: Secondary | ICD-10-CM | POA: Insufficient documentation

## 2022-03-24 DIAGNOSIS — C50412 Malignant neoplasm of upper-outer quadrant of left female breast: Secondary | ICD-10-CM | POA: Insufficient documentation

## 2022-03-24 DIAGNOSIS — M8000XD Age-related osteoporosis with current pathological fracture, unspecified site, subsequent encounter for fracture with routine healing: Secondary | ICD-10-CM

## 2022-03-24 DIAGNOSIS — Z170 Estrogen receptor positive status [ER+]: Secondary | ICD-10-CM | POA: Insufficient documentation

## 2022-03-24 LAB — RAD ONC ARIA SESSION SUMMARY
Course Elapsed Days: 1
Plan Fractions Treated to Date: 2
Plan Prescribed Dose Per Fraction: 2.66 Gy
Plan Total Fractions Prescribed: 16
Plan Total Prescribed Dose: 42.56 Gy
Reference Point Dosage Given to Date: 5.32 Gy
Reference Point Session Dosage Given: 2.66 Gy
Session Number: 2

## 2022-03-24 MED ORDER — HEPARIN SOD (PORK) LOCK FLUSH 100 UNIT/ML IV SOLN
500.0000 [IU] | Freq: Once | INTRAVENOUS | Status: AC | PRN
  Administered 2022-03-24: 500 [IU]

## 2022-03-24 MED ORDER — SODIUM CHLORIDE 0.9% FLUSH
10.0000 mL | INTRAVENOUS | Status: DC | PRN
  Administered 2022-03-24: 10 mL

## 2022-03-24 MED ORDER — DIPHENHYDRAMINE HCL 25 MG PO CAPS
25.0000 mg | ORAL_CAPSULE | Freq: Once | ORAL | Status: AC
  Administered 2022-03-24: 25 mg via ORAL
  Filled 2022-03-24: qty 1

## 2022-03-24 MED ORDER — ACETAMINOPHEN 325 MG PO TABS
650.0000 mg | ORAL_TABLET | Freq: Once | ORAL | Status: AC
  Administered 2022-03-24: 650 mg via ORAL
  Filled 2022-03-24: qty 2

## 2022-03-24 MED ORDER — TRASTUZUMAB-DKST CHEMO 150 MG IV SOLR
6.0000 mg/kg | Freq: Once | INTRAVENOUS | Status: AC
  Administered 2022-03-24: 315 mg via INTRAVENOUS
  Filled 2022-03-24: qty 15

## 2022-03-24 MED ORDER — SODIUM CHLORIDE 0.9 % IV SOLN: Freq: Once | INTRAVENOUS | Status: AC

## 2022-03-24 NOTE — Patient Instructions (Signed)
[facility] MEDICAL [facility]COLOGY  Discharge Instructions: Thank you for choosing [facility] to provide your oncology and hematology care.   If you have a lab appointment with the Cancer Center, please go directly to the Cancer Center and check in at the registration area.   Wear comfortable clothing and clothing appropriate for easy access to any Portacath or PICC line.   We strive to give you quality time with your provider. You may need to reschedule your appointment if you arrive late (15 or more minutes).  Arriving late affects you and other patients whose appointments are after yours.  Also, if you miss three or more appointments without notifying the office, you may be dismissed from the clinic at the provider's discretion.      For prescription refill requests, have your pharmacy contact our office and allow 72 hours for refills to be completed.    Today you received the following chemotherapy and/or immunotherapy agents: Ogivri      To help prevent nausea and vomiting after your treatment, we encourage you to take your nausea medication as directed.  BELOW ARE SYMPTOMS THAT SHOULD BE REPORTED IMMEDIATELY: *FEVER GREATER THAN 100.4 F (38 C) OR HIGHER *CHILLS OR SWEATING *NAUSEA AND VOMITING THAT IS NOT CONTROLLED WITH YOUR NAUSEA MEDICATION *UNUSUAL SHORTNESS OF BREATH *UNUSUAL BRUISING OR BLEEDING *URINARY PROBLEMS (pain or burning when urinating, or frequent urination) *BOWEL PROBLEMS (unusual diarrhea, constipation, pain near the anus) TENDERNESS IN MOUTH AND THROAT WITH OR WITHOUT PRESENCE OF ULCERS (sore throat, sores in mouth, or a toothache) UNUSUAL RASH, SWELLING OR PAIN  UNUSUAL VAGINAL DISCHARGE OR ITCHING   Items with * indicate a potential emergency and should be followed up as soon as possible or go to the Emergency Department if any problems should occur.  Please show the CHEMOTHERAPY ALERT CARD or IMMUNOTHERAPY ALERT CARD at check-in to the  Emergency Department and triage nurse.  Should you have questions after your visit or need to cancel or reschedule your appointment, please contact [facility]  Dept: [phone]  and follow the prompts.  Office hours are 8:00 a.m. to 4:30 p.m. Monday - Friday. Please note that voicemails left after 4:00 p.m. may not be returned until the following business day.  We are closed weekends and major holidays. You have access to a nurse at all times for urgent questions. Please call the main number to the clinic Dept: [phone] and follow the prompts.   For any non-urgent questions, you may also contact your provider using MyChart. We now offer e-Visits for anyone 18 and older to request care online for non-urgent symptoms. For details visit [email or web address].   Also download the MyChart app! Go to the app store, search "MyChart", open the app, select [facility], and log in with your MyChart username and password.  Masks are optional in the cancer centers. If you would like for your care team to wear a mask while they are taking care of you, please let them know. You may have one support person who is at least 74 years old accompany you for your appointments. 

## 2022-03-25 ENCOUNTER — Other Ambulatory Visit: Payer: Self-pay

## 2022-03-25 ENCOUNTER — Ambulatory Visit
Admission: RE | Admit: 2022-03-25 | Discharge: 2022-03-25 | Disposition: A | Discharge status: Home / Self Care | Payer: Medicare Other | Source: Ambulatory Visit | Attending: Radiation Oncology | Admitting: Radiation Oncology

## 2022-03-25 DIAGNOSIS — Z5112 Encounter for antineoplastic immunotherapy: Secondary | ICD-10-CM | POA: Diagnosis not present

## 2022-03-25 LAB — RAD ONC ARIA SESSION SUMMARY
Course Elapsed Days: 2
Plan Fractions Treated to Date: 3
Plan Prescribed Dose Per Fraction: 2.66 Gy
Plan Total Fractions Prescribed: 16
Plan Total Prescribed Dose: 42.56 Gy
Reference Point Dosage Given to Date: 7.98 Gy
Reference Point Session Dosage Given: 2.66 Gy
Session Number: 3

## 2022-03-25 NOTE — Progress Notes (Signed)
Pt here for patient teaching.  Pt given Radiation and You booklet, skin care instructions, alra deodorant and Radiaplex.    Reviewed areas of pertinence such as fatigue, hair loss, skin changes, breast tenderness, and breast swelling. Pt able to give teach back of to pat skin and use unscented/gentle soap,apply Radiaplex bid, avoid applying anything to skin within 4 hours of treatment, avoid wearing an under wire bra, and to use an electric razor if they must shave. Pt verbalizes understanding of information given and will contact nursing with any questions or concerns.    Elvira Langston M. Stellarose Cerny RN, BSN  

## 2022-03-26 ENCOUNTER — Ambulatory Visit
Admission: RE | Admit: 2022-03-26 | Discharge: 2022-03-26 | Disposition: A | Discharge status: Home / Self Care | Payer: Medicare Other | Source: Ambulatory Visit | Attending: Radiation Oncology | Admitting: Radiation Oncology

## 2022-03-26 ENCOUNTER — Other Ambulatory Visit: Payer: Self-pay

## 2022-03-26 DIAGNOSIS — Z5112 Encounter for antineoplastic immunotherapy: Secondary | ICD-10-CM | POA: Diagnosis not present

## 2022-03-26 DIAGNOSIS — Z170 Estrogen receptor positive status [ER+]: Secondary | ICD-10-CM

## 2022-03-26 LAB — RAD ONC ARIA SESSION SUMMARY
Course Elapsed Days: 3
Plan Fractions Treated to Date: 4
Plan Prescribed Dose Per Fraction: 2.66 Gy
Plan Total Fractions Prescribed: 16
Plan Total Prescribed Dose: 42.56 Gy
Reference Point Dosage Given to Date: 10.64 Gy
Reference Point Session Dosage Given: 2.66 Gy
Session Number: 4

## 2022-03-26 MED ORDER — RADIAPLEXRX EX GEL: Freq: Once | CUTANEOUS | Status: AC

## 2022-03-26 MED ORDER — ALRA NON-METALLIC DEODORANT (RAD-ONC)
1.0000 | Freq: Once | TOPICAL | Status: AC
  Administered 2022-03-26: 1 via TOPICAL

## 2022-03-29 ENCOUNTER — Other Ambulatory Visit: Payer: Self-pay

## 2022-03-29 ENCOUNTER — Ambulatory Visit
Admission: RE | Admit: 2022-03-29 | Discharge: 2022-03-29 | Disposition: A | Discharge status: Home / Self Care | Payer: Medicare Other | Source: Ambulatory Visit | Attending: Radiation Oncology | Admitting: Radiation Oncology

## 2022-03-29 DIAGNOSIS — Z5112 Encounter for antineoplastic immunotherapy: Secondary | ICD-10-CM | POA: Diagnosis not present

## 2022-03-29 LAB — RAD ONC ARIA SESSION SUMMARY
Course Elapsed Days: 6
Plan Fractions Treated to Date: 5
Plan Prescribed Dose Per Fraction: 2.66 Gy
Plan Total Fractions Prescribed: 16
Plan Total Prescribed Dose: 42.56 Gy
Reference Point Dosage Given to Date: 13.3 Gy
Reference Point Session Dosage Given: 2.66 Gy
Session Number: 5

## 2022-03-30 ENCOUNTER — Ambulatory Visit
Admission: RE | Admit: 2022-03-30 | Discharge: 2022-03-30 | Disposition: A | Discharge status: Home / Self Care | Payer: Medicare Other | Source: Ambulatory Visit | Attending: Radiation Oncology | Admitting: Radiation Oncology

## 2022-03-30 ENCOUNTER — Other Ambulatory Visit: Payer: Self-pay

## 2022-03-30 DIAGNOSIS — Z5112 Encounter for antineoplastic immunotherapy: Secondary | ICD-10-CM | POA: Diagnosis not present

## 2022-03-30 LAB — RAD ONC ARIA SESSION SUMMARY
Course Elapsed Days: 7
Plan Fractions Treated to Date: 6
Plan Prescribed Dose Per Fraction: 2.66 Gy
Plan Total Fractions Prescribed: 16
Plan Total Prescribed Dose: 42.56 Gy
Reference Point Dosage Given to Date: 15.96 Gy
Reference Point Session Dosage Given: 2.66 Gy
Session Number: 6

## 2022-03-31 ENCOUNTER — Ambulatory Visit
Admission: RE | Admit: 2022-03-31 | Discharge: 2022-03-31 | Disposition: A | Discharge status: Home / Self Care | Payer: Medicare Other | Source: Ambulatory Visit | Attending: Radiation Oncology | Admitting: Radiation Oncology

## 2022-03-31 ENCOUNTER — Other Ambulatory Visit: Payer: Self-pay

## 2022-03-31 DIAGNOSIS — Z5112 Encounter for antineoplastic immunotherapy: Secondary | ICD-10-CM | POA: Diagnosis not present

## 2022-03-31 LAB — RAD ONC ARIA SESSION SUMMARY
Course Elapsed Days: 8
Plan Fractions Treated to Date: 7
Plan Prescribed Dose Per Fraction: 2.66 Gy
Plan Total Fractions Prescribed: 16
Plan Total Prescribed Dose: 42.56 Gy
Reference Point Dosage Given to Date: 18.62 Gy
Reference Point Session Dosage Given: 2.66 Gy
Session Number: 7

## 2022-04-01 ENCOUNTER — Other Ambulatory Visit: Payer: Self-pay

## 2022-04-01 ENCOUNTER — Ambulatory Visit
Admission: RE | Admit: 2022-04-01 | Discharge: 2022-04-01 | Disposition: A | Discharge status: Home / Self Care | Payer: Medicare Other | Source: Ambulatory Visit | Attending: Radiation Oncology | Admitting: Radiation Oncology

## 2022-04-01 DIAGNOSIS — Z5112 Encounter for antineoplastic immunotherapy: Secondary | ICD-10-CM | POA: Diagnosis not present

## 2022-04-01 LAB — RAD ONC ARIA SESSION SUMMARY
Course Elapsed Days: 9
Plan Fractions Treated to Date: 8
Plan Prescribed Dose Per Fraction: 2.66 Gy
Plan Total Fractions Prescribed: 16
Plan Total Prescribed Dose: 42.56 Gy
Reference Point Dosage Given to Date: 21.28 Gy
Reference Point Session Dosage Given: 0.2643 Gy
Session Number: 8

## 2022-04-02 ENCOUNTER — Ambulatory Visit
Admission: RE | Admit: 2022-04-02 | Discharge: 2022-04-02 | Disposition: A | Discharge status: Home / Self Care | Payer: Medicare Other | Source: Ambulatory Visit | Attending: Radiation Oncology | Admitting: Radiation Oncology

## 2022-04-02 ENCOUNTER — Other Ambulatory Visit: Payer: Self-pay

## 2022-04-02 DIAGNOSIS — Z5112 Encounter for antineoplastic immunotherapy: Secondary | ICD-10-CM | POA: Diagnosis not present

## 2022-04-02 DIAGNOSIS — C50412 Malignant neoplasm of upper-outer quadrant of left female breast: Secondary | ICD-10-CM

## 2022-04-02 LAB — RAD ONC ARIA SESSION SUMMARY
Course Elapsed Days: 10
Plan Fractions Treated to Date: 9
Plan Prescribed Dose Per Fraction: 2.66 Gy
Plan Total Fractions Prescribed: 16
Plan Total Prescribed Dose: 42.56 Gy
Reference Point Dosage Given to Date: 23.94 Gy
Reference Point Session Dosage Given: 2.66 Gy
Session Number: 9

## 2022-04-02 MED ORDER — RADIAPLEXRX EX GEL: Freq: Once | CUTANEOUS | Status: AC

## 2022-04-05 ENCOUNTER — Ambulatory Visit
Admission: RE | Admit: 2022-04-05 | Discharge: 2022-04-05 | Disposition: A | Discharge status: Home / Self Care | Payer: Medicare Other | Source: Ambulatory Visit | Attending: Radiation Oncology | Admitting: Radiation Oncology

## 2022-04-05 ENCOUNTER — Other Ambulatory Visit: Payer: Self-pay

## 2022-04-05 ENCOUNTER — Other Ambulatory Visit: Payer: Self-pay | Admitting: Family Medicine

## 2022-04-05 DIAGNOSIS — R0989 Other specified symptoms and signs involving the circulatory and respiratory systems: Secondary | ICD-10-CM | POA: Diagnosis not present

## 2022-04-05 DIAGNOSIS — Z170 Estrogen receptor positive status [ER+]: Secondary | ICD-10-CM | POA: Insufficient documentation

## 2022-04-05 DIAGNOSIS — R197 Diarrhea, unspecified: Secondary | ICD-10-CM | POA: Diagnosis not present

## 2022-04-05 DIAGNOSIS — M810 Age-related osteoporosis without current pathological fracture: Secondary | ICD-10-CM | POA: Insufficient documentation

## 2022-04-05 DIAGNOSIS — C50412 Malignant neoplasm of upper-outer quadrant of left female breast: Secondary | ICD-10-CM | POA: Insufficient documentation

## 2022-04-05 DIAGNOSIS — Z808 Family history of malignant neoplasm of other organs or systems: Secondary | ICD-10-CM | POA: Diagnosis not present

## 2022-04-05 DIAGNOSIS — Z5112 Encounter for antineoplastic immunotherapy: Secondary | ICD-10-CM | POA: Diagnosis present

## 2022-04-05 DIAGNOSIS — N6489 Other specified disorders of breast: Secondary | ICD-10-CM | POA: Insufficient documentation

## 2022-04-05 DIAGNOSIS — Z79899 Other long term (current) drug therapy: Secondary | ICD-10-CM | POA: Diagnosis not present

## 2022-04-05 DIAGNOSIS — N6311 Unspecified lump in the right breast, upper outer quadrant: Secondary | ICD-10-CM | POA: Insufficient documentation

## 2022-04-05 DIAGNOSIS — G629 Polyneuropathy, unspecified: Secondary | ICD-10-CM | POA: Diagnosis not present

## 2022-04-05 DIAGNOSIS — Z1231 Encounter for screening mammogram for malignant neoplasm of breast: Secondary | ICD-10-CM

## 2022-04-05 LAB — RAD ONC ARIA SESSION SUMMARY
Course Elapsed Days: 13
Plan Fractions Treated to Date: 10
Plan Prescribed Dose Per Fraction: 2.66 Gy
Plan Total Fractions Prescribed: 16
Plan Total Prescribed Dose: 42.56 Gy
Reference Point Dosage Given to Date: 26.6 Gy
Reference Point Session Dosage Given: 2.66 Gy
Session Number: 10

## 2022-04-06 ENCOUNTER — Other Ambulatory Visit: Payer: Self-pay

## 2022-04-06 ENCOUNTER — Ambulatory Visit
Admission: RE | Admit: 2022-04-06 | Discharge: 2022-04-06 | Disposition: A | Discharge status: Home / Self Care | Payer: Medicare Other | Source: Ambulatory Visit | Attending: Radiation Oncology | Admitting: Radiation Oncology

## 2022-04-06 DIAGNOSIS — Z5112 Encounter for antineoplastic immunotherapy: Secondary | ICD-10-CM | POA: Diagnosis not present

## 2022-04-06 LAB — RAD ONC ARIA SESSION SUMMARY
Course Elapsed Days: 14
Plan Fractions Treated to Date: 11
Plan Prescribed Dose Per Fraction: 2.66 Gy
Plan Total Fractions Prescribed: 16
Plan Total Prescribed Dose: 42.56 Gy
Reference Point Dosage Given to Date: 29.26 Gy
Reference Point Session Dosage Given: 2.66 Gy
Session Number: 11

## 2022-04-07 ENCOUNTER — Other Ambulatory Visit: Payer: Self-pay

## 2022-04-07 ENCOUNTER — Ambulatory Visit
Admission: RE | Admit: 2022-04-07 | Discharge: 2022-04-07 | Disposition: A | Discharge status: Home / Self Care | Payer: Medicare Other | Source: Ambulatory Visit | Attending: Radiation Oncology | Admitting: Radiation Oncology

## 2022-04-07 DIAGNOSIS — Z5112 Encounter for antineoplastic immunotherapy: Secondary | ICD-10-CM | POA: Diagnosis not present

## 2022-04-07 LAB — RAD ONC ARIA SESSION SUMMARY
Course Elapsed Days: 15
Plan Fractions Treated to Date: 12
Plan Prescribed Dose Per Fraction: 2.66 Gy
Plan Total Fractions Prescribed: 16
Plan Total Prescribed Dose: 42.56 Gy
Reference Point Dosage Given to Date: 31.92 Gy
Reference Point Session Dosage Given: 2.66 Gy
Session Number: 12

## 2022-04-08 ENCOUNTER — Other Ambulatory Visit: Payer: Self-pay

## 2022-04-08 ENCOUNTER — Ambulatory Visit
Admission: RE | Admit: 2022-04-08 | Discharge: 2022-04-08 | Disposition: A | Discharge status: Home / Self Care | Payer: Medicare Other | Source: Ambulatory Visit | Attending: Radiation Oncology | Admitting: Radiation Oncology

## 2022-04-08 DIAGNOSIS — Z5112 Encounter for antineoplastic immunotherapy: Secondary | ICD-10-CM | POA: Diagnosis not present

## 2022-04-08 LAB — RAD ONC ARIA SESSION SUMMARY
Course Elapsed Days: 16
Plan Fractions Treated to Date: 13
Plan Prescribed Dose Per Fraction: 2.66 Gy
Plan Total Fractions Prescribed: 16
Plan Total Prescribed Dose: 42.56 Gy
Reference Point Dosage Given to Date: 34.58 Gy
Reference Point Session Dosage Given: 2.66 Gy
Session Number: 13

## 2022-04-09 ENCOUNTER — Other Ambulatory Visit: Payer: Self-pay

## 2022-04-09 ENCOUNTER — Ambulatory Visit
Admission: RE | Admit: 2022-04-09 | Discharge: 2022-04-09 | Disposition: A | Discharge status: Home / Self Care | Payer: Medicare Other | Source: Ambulatory Visit | Attending: Radiation Oncology | Admitting: Radiation Oncology

## 2022-04-09 ENCOUNTER — Ambulatory Visit: Payer: Medicare Other | Admitting: Radiation Oncology

## 2022-04-09 DIAGNOSIS — Z170 Estrogen receptor positive status [ER+]: Secondary | ICD-10-CM

## 2022-04-09 DIAGNOSIS — Z5112 Encounter for antineoplastic immunotherapy: Secondary | ICD-10-CM | POA: Diagnosis not present

## 2022-04-09 LAB — RAD ONC ARIA SESSION SUMMARY
Course Elapsed Days: 17
Plan Fractions Treated to Date: 14
Plan Prescribed Dose Per Fraction: 2.66 Gy
Plan Total Fractions Prescribed: 16
Plan Total Prescribed Dose: 42.56 Gy
Reference Point Dosage Given to Date: 37.24 Gy
Reference Point Session Dosage Given: 2.66 Gy
Session Number: 14

## 2022-04-09 MED ORDER — RADIAPLEXRX EX GEL: Freq: Once | CUTANEOUS | Status: AC

## 2022-04-12 ENCOUNTER — Other Ambulatory Visit: Payer: Self-pay

## 2022-04-12 ENCOUNTER — Ambulatory Visit
Admission: RE | Admit: 2022-04-12 | Discharge: 2022-04-12 | Disposition: A | Discharge status: Home / Self Care | Payer: Medicare Other | Source: Ambulatory Visit | Attending: Radiation Oncology | Admitting: Radiation Oncology

## 2022-04-12 DIAGNOSIS — Z5112 Encounter for antineoplastic immunotherapy: Secondary | ICD-10-CM | POA: Diagnosis not present

## 2022-04-12 LAB — RAD ONC ARIA SESSION SUMMARY
Course Elapsed Days: 20
Plan Fractions Treated to Date: 15
Plan Prescribed Dose Per Fraction: 2.66 Gy
Plan Total Fractions Prescribed: 16
Plan Total Prescribed Dose: 42.56 Gy
Reference Point Dosage Given to Date: 39.9 Gy
Reference Point Session Dosage Given: 2.66 Gy
Session Number: 15

## 2022-04-13 ENCOUNTER — Other Ambulatory Visit: Payer: Self-pay

## 2022-04-13 ENCOUNTER — Ambulatory Visit
Admission: RE | Admit: 2022-04-13 | Discharge: 2022-04-13 | Disposition: A | Discharge status: Home / Self Care | Payer: Medicare Other | Source: Ambulatory Visit | Attending: Radiation Oncology | Admitting: Radiation Oncology

## 2022-04-13 DIAGNOSIS — Z5112 Encounter for antineoplastic immunotherapy: Secondary | ICD-10-CM | POA: Diagnosis not present

## 2022-04-13 LAB — RAD ONC ARIA SESSION SUMMARY
Course Elapsed Days: 21
Plan Fractions Treated to Date: 16
Plan Prescribed Dose Per Fraction: 2.66 Gy
Plan Total Fractions Prescribed: 16
Plan Total Prescribed Dose: 42.56 Gy
Reference Point Dosage Given to Date: 42.56 Gy
Reference Point Session Dosage Given: 2.66 Gy
Session Number: 16

## 2022-04-14 ENCOUNTER — Encounter: Payer: Self-pay | Admitting: Hematology

## 2022-04-14 ENCOUNTER — Inpatient Hospital Stay (HOSPITAL_BASED_OUTPATIENT_CLINIC_OR_DEPARTMENT_OTHER): Payer: Medicare Other | Admitting: Hematology

## 2022-04-14 ENCOUNTER — Inpatient Hospital Stay: Payer: Medicare Other

## 2022-04-14 ENCOUNTER — Inpatient Hospital Stay: Payer: Medicare Other | Attending: Hematology

## 2022-04-14 ENCOUNTER — Ambulatory Visit
Admission: RE | Admit: 2022-04-14 | Discharge: 2022-04-14 | Disposition: A | Discharge status: Home / Self Care | Payer: Medicare Other | Source: Ambulatory Visit | Attending: Radiation Oncology | Admitting: Radiation Oncology

## 2022-04-14 ENCOUNTER — Other Ambulatory Visit: Payer: Self-pay

## 2022-04-14 VITALS — BP 164/81 | HR 69 | Temp 97.7°F | Resp 15 | Ht 61.0 in | Wt 114.4 lb

## 2022-04-14 VITALS — BP 153/84 | HR 53 | Temp 97.6°F | Resp 16

## 2022-04-14 DIAGNOSIS — E785 Hyperlipidemia, unspecified: Secondary | ICD-10-CM | POA: Diagnosis not present

## 2022-04-14 DIAGNOSIS — R197 Diarrhea, unspecified: Secondary | ICD-10-CM | POA: Insufficient documentation

## 2022-04-14 DIAGNOSIS — C50412 Malignant neoplasm of upper-outer quadrant of left female breast: Secondary | ICD-10-CM | POA: Diagnosis not present

## 2022-04-14 DIAGNOSIS — Z95828 Presence of other vascular implants and grafts: Secondary | ICD-10-CM

## 2022-04-14 DIAGNOSIS — Z5112 Encounter for antineoplastic immunotherapy: Secondary | ICD-10-CM | POA: Insufficient documentation

## 2022-04-14 DIAGNOSIS — Z170 Estrogen receptor positive status [ER+]: Secondary | ICD-10-CM | POA: Insufficient documentation

## 2022-04-14 DIAGNOSIS — N6311 Unspecified lump in the right breast, upper outer quadrant: Secondary | ICD-10-CM | POA: Insufficient documentation

## 2022-04-14 DIAGNOSIS — G629 Polyneuropathy, unspecified: Secondary | ICD-10-CM | POA: Insufficient documentation

## 2022-04-14 DIAGNOSIS — R0989 Other specified symptoms and signs involving the circulatory and respiratory systems: Secondary | ICD-10-CM | POA: Insufficient documentation

## 2022-04-14 DIAGNOSIS — Z79899 Other long term (current) drug therapy: Secondary | ICD-10-CM | POA: Insufficient documentation

## 2022-04-14 DIAGNOSIS — N6489 Other specified disorders of breast: Secondary | ICD-10-CM | POA: Insufficient documentation

## 2022-04-14 DIAGNOSIS — M810 Age-related osteoporosis without current pathological fracture: Secondary | ICD-10-CM | POA: Insufficient documentation

## 2022-04-14 DIAGNOSIS — Z808 Family history of malignant neoplasm of other organs or systems: Secondary | ICD-10-CM | POA: Insufficient documentation

## 2022-04-14 LAB — CBC WITH DIFFERENTIAL (CANCER CENTER ONLY)
Abs Immature Granulocytes: 0.01 10*3/uL (ref 0.00–0.07)
Basophils Absolute: 0 10*3/uL (ref 0.0–0.1)
Basophils Relative: 1 %
Eosinophils Absolute: 0.2 10*3/uL (ref 0.0–0.5)
Eosinophils Relative: 3 %
HCT: 40.1 % (ref 36.0–46.0)
Hemoglobin: 13.8 g/dL (ref 12.0–15.0)
Immature Granulocytes: 0 %
Lymphocytes Relative: 14 %
Lymphs Abs: 0.9 10*3/uL (ref 0.7–4.0)
MCH: 30.8 pg (ref 26.0–34.0)
MCHC: 34.4 g/dL (ref 30.0–36.0)
MCV: 89.5 fL (ref 80.0–100.0)
Monocytes Absolute: 0.6 10*3/uL (ref 0.1–1.0)
Monocytes Relative: 10 %
Neutro Abs: 4.5 10*3/uL (ref 1.7–7.7)
Neutrophils Relative %: 72 %
Platelet Count: 216 10*3/uL (ref 150–400)
RBC: 4.48 MIL/uL (ref 3.87–5.11)
RDW: 12.3 % (ref 11.5–15.5)
WBC Count: 6.1 10*3/uL (ref 4.0–10.5)
nRBC: 0 % (ref 0.0–0.2)

## 2022-04-14 LAB — CMP (CANCER CENTER ONLY)
ALT: 18 U/L (ref 0–44)
AST: 28 U/L (ref 15–41)
Albumin: 4.1 g/dL (ref 3.5–5.0)
Alkaline Phosphatase: 51 U/L (ref 38–126)
Anion gap: 7 (ref 5–15)
BUN: 16 mg/dL (ref 8–23)
CO2: 27 mmol/L (ref 22–32)
Calcium: 9.7 mg/dL (ref 8.9–10.3)
Chloride: 108 mmol/L (ref 98–111)
Creatinine: 0.59 mg/dL (ref 0.44–1.00)
GFR, Estimated: >60 mL/min (ref >60)
Glucose, Bld: 112 mg/dL — ABNORMAL HIGH (ref 70–99)
Potassium: 3.9 mmol/L (ref 3.5–5.1)
Sodium: 142 mmol/L (ref 135–145)
Total Bilirubin: 0.6 mg/dL (ref 0.3–1.2)
Total Protein: 6.4 g/dL — ABNORMAL LOW (ref 6.5–8.1)

## 2022-04-14 LAB — RAD ONC ARIA SESSION SUMMARY
Course Elapsed Days: 22
Plan Fractions Treated to Date: 1
Plan Prescribed Dose Per Fraction: 2 Gy
Plan Total Fractions Prescribed: 4
Plan Total Prescribed Dose: 8 Gy
Reference Point Dosage Given to Date: 2 Gy
Reference Point Session Dosage Given: 2 Gy
Session Number: 17

## 2022-04-14 MED ORDER — ACETAMINOPHEN 325 MG PO TABS
650.0000 mg | ORAL_TABLET | Freq: Once | ORAL | Status: AC
  Administered 2022-04-14: 650 mg via ORAL
  Filled 2022-04-14: qty 2

## 2022-04-14 MED ORDER — SODIUM CHLORIDE 0.9% FLUSH
10.0000 mL | Freq: Once | INTRAVENOUS | Status: AC
  Administered 2022-04-14: 10 mL

## 2022-04-14 MED ORDER — SODIUM CHLORIDE 0.9% FLUSH
10.0000 mL | INTRAVENOUS | Status: DC | PRN
  Administered 2022-04-14: 10 mL

## 2022-04-14 MED ORDER — TAMOXIFEN CITRATE 20 MG PO TABS: 20.0000 mg | ORAL_TABLET | Freq: Every day | ORAL | 3 refills | Status: DC

## 2022-04-14 MED ORDER — HEPARIN SOD (PORK) LOCK FLUSH 100 UNIT/ML IV SOLN
500.0000 [IU] | Freq: Once | INTRAVENOUS | Status: AC | PRN
  Administered 2022-04-14: 500 [IU]

## 2022-04-14 MED ORDER — SODIUM CHLORIDE 0.9 % IV SOLN: Freq: Once | INTRAVENOUS | Status: AC

## 2022-04-14 MED ORDER — TRASTUZUMAB-DKST CHEMO 150 MG IV SOLR
6.0000 mg/kg | Freq: Once | INTRAVENOUS | Status: AC
  Administered 2022-04-14: 315 mg via INTRAVENOUS
  Filled 2022-04-14: qty 15

## 2022-04-14 MED ORDER — DIPHENHYDRAMINE HCL 25 MG PO CAPS
25.0000 mg | ORAL_CAPSULE | Freq: Once | ORAL | Status: AC
  Administered 2022-04-14: 25 mg via ORAL
  Filled 2022-04-14: qty 1

## 2022-04-14 NOTE — Progress Notes (Signed)
Per Dr Burr Medico, ok to proceed with echo from July today

## 2022-04-14 NOTE — Progress Notes (Addendum)
Katherine Donovan   [phone] [phone]   Clinic Follow up Note   Patient Care Team: Nickola Major, MD as PCP - General (Family Medicine) Mauro Kaufmann, RN as Oncology Nurse Navigator Rockwell Germany, RN as Oncology Nurse Navigator [name], MD as Consulting Physician (General Surgery) [name], MD as Consulting Physician (Hematology) Kyung Rudd, MD as Consulting Physician (Radiation Oncology)  Date of Service:  04/14/2022  CHIEF COMPLAINT: f/u of left breast cancer  CURRENT THERAPY:  Maintenance trastuzumab, q21d, starting 10/07/21  ASSESSMENT & PLAN:  Katherine Donovan is a 76 y.o. post-menopausal female with   1. Malignant neoplasm of upper-outer quadrant of left breast, ILC, Stage IA, c(T1c, N0), ER+/PR-/HER2+, Grade 3  -found on screening mammogram. B/l MM and [place] on 09/01/21 showed a 2.9 cm left breast mass. Biopsy on 09/14/21 showed invasive lobular carcinoma, grade 2-3. -she completed 5 cycles neoadjuvant TCHP 10/07/21 - 12/30/21. Tolerated poorly overall with neuropathy, diarrhea, and low appetite. -left lumpectomy on 02/04/22 by Dr. Donne Hazel showed complete pathological response, no residual carcinoma. -she moved to maintenance trastuzumab on 02/10/22, plan to complete a year in end of 09/2022. She is tolerating well overall. -she is currently receiving radiation under Dr. Lisbeth Renshaw, 03/23/22 - 04/19/22. Tolerating well with mild skin burning. -we again discussed antiestrogen therapy. Given her low bone density, I recommend tamoxifen.  --The potential side effects, which includes but not limited to, hot flash, skin and vaginal dryness, slightly increased risk of cardiovascular disease and cataract, small risk of thrombosis and endometrial cancer, were discussed with her in great details. Preventive strategies for thrombosis, such as being physically active, using compression stocks, avoid cigarette smoking, etc., were reviewed with her. I  also recommend her to follow-up with her gynecologist once a year, and watch for vaginal spotting or bleeding, as a clinically sign of endometrial cancer, etc. She voiced good understanding, and agrees to proceed. Will start after she completes adjuvant breast radiation. -We also reviewed the breast cancer surveillance, including annual mammogram, and physical exam. She is scheduled for mammogram on 09/06/22.   2. 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 She notes the diarrhea has improved and is no longer daily, but she has not had a solid BM in a long time. I encouraged her to continue imodium -s/p C5, she has developed persistent numbness and tingling in her fingers. She is still experiencing numbness causing mild functional deficits. Will monitor; if she develops worsening tingling, I can call in gabapentin for her.   3. Osteoporosis -Her most recent DEXA was 05/14/20 showing T-score of -3.6.  -she reports being on Fosamax consistently for the past 10 years. -repeat scheduled for 09/06/22 with mammogram.   4. Genetics -she has no family history of breast cancer. She notes melanoma in her mother and a metastatic cancer (unknown primary) in a paternal uncle. -testing on 09/23/21 was negative, with [facility] in [place]:  -proceed with trastuzumab as scheduled -continue daily radiation through 10/16  -echo in next few weeks  -continue trastuzumab every 3 weeks -start tamoxifen in 3-4 weeks (2-3 weeks after completing radiation), I called in today  -Lab and survivorship in 9 weeks   No problem-specific Assessment & Plan notes found for this encounter.   SUMMARY OF ONCOLOGIC HISTORY: Oncology History Overview Note   Cancer Staging  Malignant neoplasm of upper-outer quadrant of left breast in female,  estrogen receptor positive ([place]) Staging form: Breast, AJCC 8th Edition - Clinical stage from 09/14/2021: Stage  IA (cT1c, cN0, cM0, G3, ER+, PR-, HER2+) - Signed by [name], MD on 09/22/2021    Malignant neoplasm of upper-outer quadrant of left breast in female, estrogen receptor positive ([place])  09/01/2021 Mammogram   CLINICAL DATA:  Bilateral screening recall for a right breast asymmetry and left breast mass.   EXAM: DIGITAL DIAGNOSTIC BILATERAL MAMMOGRAM WITH TOMOSYNTHESIS AND CAD; ULTRASOUND LEFT BREAST LIMITED; ULTRASOUND RIGHT BREAST LIMITED  IMPRESSION: 1. There is a highly suspicious ill-defined mass in the upper-outer left breast. The mass measures at least 2.9 cm, and possibly up to 3.8 cm. Accurate measurement is difficult due to its ill-defined insinuating appearance.   2.  No evidence of left axillary lymphadenopathy.   3. No persistent suspicious mammographic or targeted sonographic abnormalities in the right breast.   09/14/2021 Cancer Staging   Staging form: Breast, AJCC 8th Edition - Clinical stage from 09/14/2021: Stage IA (cT1c, cN0, cM0, G3, ER+, PR-, HER2+) - Signed by [name], MD on 09/22/2021 Stage prefix: Initial diagnosis Histologic grading system: 3 grade system   09/14/2021 Initial Biopsy   Diagnosis Breast, left, needle core biopsy, 1 o'clock, 4cmfn - INVASIVE MAMMARY CARCINOMA. SEE NOTE Diagnosis Note Carcinoma measures 1.3 cm in greatest linear dimension and appears grade 2-3.  Immunohistochemical stain for E-cadherin is negative, consistent with a lobular phenotype.  PROGNOSTIC INDICATORS Results: The tumor cells are POSITIVE for Her2 (3+). Estrogen Receptor: 50%, POSITIVE, MODERATE-WEAK STAINING INTENSITY Progesterone Receptor: 0%, NEGATIVE Proliferation Marker Ki67: 15%   09/18/2021 Initial Diagnosis   Malignant neoplasm of upper-outer quadrant of left breast in female, estrogen receptor positive ([place])   09/21/2021 Imaging   EXAM: BILATERAL BREAST MRI WITH AND WITHOUT CONTRAST  IMPRESSION: 1. 3 cm biopsy-proven malignancy within the UPPER-OUTER  LEFT breast. No evidence of multifocal, multicentric or contralateral malignancy. No abnormal lymph nodes.   10/07/2021 - 02/10/2022 Chemotherapy   Patient is on Treatment Plan : BREAST  Docetaxel + Carboplatin + Trastuzumab + Pertuzumab  (TCHP) q21d      10/16/2021 Genetic Testing   Negative genetic testing: no pathogenic variants detected in [facility] CancerNext-Expanded +RNAinsight Panel.  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 Report date is October 16, 2021.   The CancerNext-Expanded gene panel offered by [facility] and includes sequencing, rearrangement, and RNA analysis for the following 77 genes: AIP, ALK, APC, ATM, AXIN2, BAP1, BARD1, BLM, BMPR1A, BRCA1, BRCA2, BRIP1, CDC73, CDH1, CDK4, CDKN1B, CDKN2A, CHEK2, CTNNA1, DICER1, FANCC, FH, FLCN, GALNT12, KIF1B, LZTR1, MAX, MEN1, MET, MLH1, MSH2, MSH3, MSH6, MUTYH, NBN, NF1, NF2, NTHL1, PALB2, PHOX2B, PMS2, POT1, PRKAR1A, PTCH1, PTEN, RAD51C, RAD51D, RB1, RECQL, RET, SDHA, SDHAF2, SDHB, SDHC, SDHD, SMAD4, SMARCA4, SMARCB1, SMARCE1, STK11, SUFU, TMEM127, TP53, TSC1, TSC2, VHL and XRCC2 (sequencing and deletion/duplication); EGFR, EGLN1, HOXB13, KIT, MITF, PDGFRA, POLD1, and POLE (sequencing only); EPCAM and GREM1 (deletion/duplication only).   02/04/2022 Definitive Surgery   Lumpectomy by Dr. Georgiann Mccoy. BREAST, LEFT, LUMPECTOMY:  - Hyaline fibrosis and focal calcifications with no evidence of residual carcinoma - complete therapeutic response  - Mild fibrocystic change  - See comment  B. LYMPH NODE, LEFT AXILLA, SENTINEL, EXCISION:  - Lymph node, negative for carcinoma (0/1)  C. LYMPH NODE, LEFT AXILLA, SENTINEL, EXCISION:  - Lymph node, negative for carcinoma (0/1)  D. LYMPH NODE, LEFT AXILLA, SENTINEL, EXCISION:  - Lymph node, negative for carcinoma (0/1)  E. LYMPH NODE, LEFT  AXILLA, SENTINEL, EXCISION:  - Lymph node, negative for carcinoma (0/1)  F. LYMPH NODE, LEFT AXILLA, SENTINEL, EXCISION:  - Lymph node,  negative for carcinoma (0/1)  G. LYMPH NODE, LEFT AXILLA, SENTINEL, EXCISION:  - Lymph node, negative for carcinoma (0/1)  H. BREAST, LEFT INFERIOR MARGIN, EXCISION:  - Hyaline fibrosis with no evidence of residual carcinoma  - Mild fibrocystic change with calcification   COMMENT:  The appropriate pathologic stage is ypT0 ypN0    02/04/2022 Cancer Staging   Staging form: Breast, AJCC 8th Edition - Pathologic stage from 02/04/2022: No Stage Recommended (ypT0, pN0(sn), cM0) - Signed by Alla Feeling, NP on 02/10/2022 Stage prefix: Post-therapy Response to neoadjuvant therapy: Complete response Method of lymph node assessment: Sentinel lymph node biopsy   03/03/2022 -  Chemotherapy   Patient is on Treatment Plan : BREAST Trastuzumab IV (8/6) or SQ (600) D1 q21d        INTERVAL HISTORY:  Katherine Donovan is here for a follow up of breast cancer. She was last seen by NP Lacie on 02/10/22. She presents to the clinic alone. She reports she is doing well overall. She reports some persistent symptoms, with her most bothersome side effect being watery eyes and runny nose. She explains this began towards the end of chemo.  She also reports soft to watery stool, 3-4 times a day. She explains the diarrhea does not happen daily anymore. She also reports some worsening of her nails (hands and feet), with darkening to her toenails and peeling of her fingernails.  She also reports persistent numbness/tingling in her fingertips causing difficulty picking up small things/putting in her earrings. She also notes some tingling in her toes, but not as bad as her fingers.   All other systems were reviewed with the patient and are negative.  MEDICAL HISTORY:  Past Medical History:  Diagnosis Date   History of bunionectomy 09/02/2020   Hyperlipidemia    Hyperlipidemia    left breast ILC 09/15/21   Osteoporosis    Rosacea    to face    SURGICAL HISTORY: Past Surgical History:  Procedure Laterality Date    BREAST LUMPECTOMY WITH RADIOACTIVE SEED AND SENTINEL LYMPH NODE BIOPSY Left 02/04/2022   Procedure: LEFT BREAST SEED GUIDED LUMPECTOMY AND LEFT AXILLARY SENTINEL NODE BIOPSY;  Surgeon: [name], MD;  Location: [facility];  Service: General;  Laterality: Left;  LMA & PEC BLOCK   PORTACATH PLACEMENT Right 10/06/2021   Procedure: INSERTION PORT-A-CATH;  Surgeon: [name], MD;  Location: [facility];  Service: General;  Laterality: Right;   [place]      I have reviewed the social history and family history with the patient and they are unchanged from previous note.  ALLERGIES:  has No Known Allergies.  MEDICATIONS:  Current Outpatient Medications  Medication Sig Dispense Refill   tamoxifen (NOLVADEX) 20 MG tablet Take 1 tablet (20 mg total) by mouth daily. 30 tablet 3   alendronate (FOSAMAX) 70 MG tablet Take 70 mg by mouth once a week. Take with a full glass of water on an empty stomach.     Calcium Carbonate-Vitamin D 600-400 MG-UNIT tablet Take 1 tablet by mouth daily.       CRESTOR 10 MG tablet Take 5 mg by mouth Daily.     Glucosamine 750 MG TABS Take 750 mg by mouth daily.       Lactobacillus Rhamnosus, GG, (RA PROBIOTIC DIGESTIVE CARE)  CAPS Take 1 capsule by mouth 3 (three) times a week.     Misc Natural Products (JOINT SUPPORT COMPLEX PO) Take by mouth daily. [place] Joints-Cartilage-Bone Triple Action Joint Health; Undenatured Type II Collagen     Multiple Vitamin (MULTI-VITAMIN) tablet Take 1 tablet by mouth daily.     No current facility-administered medications for this visit.    PHYSICAL EXAMINATION: ECOG PERFORMANCE STATUS: 0 - Asymptomatic  Vitals:   04/14/22 1415  BP: (!) 164/81  Pulse: 69  Resp: 15  Temp: 97.7 F (36.5 C)  SpO2: 100%   Wt Readings from Last 3 Encounters:  04/14/22 114 lb 6.4 oz (51.9 kg)  03/24/22 114 lb 1.9 oz (51.8 kg)  03/11/22 112 lb (50.8 kg)     GENERAL:alert,  no distress and comfortable SKIN: skin color normal, no rashes or significant lesions EYES: normal, Conjunctiva are pink and non-injected, sclera clear  NEURO: alert & oriented x 3 with fluent speech  LABORATORY DATA:  I have reviewed the data as listed    Latest Ref Rng & Units 04/14/2022    1:44 PM 02/10/2022    9:18 AM 01/20/2022    9:19 AM  CBC  WBC 4.0 - 10.5 K/uL 6.1  6.1  9.7   Hemoglobin 12.0 - 15.0 g/dL 13.8  12.1  11.4   Hematocrit 36.0 - 46.0 % 40.1  35.8  34.6   Platelets 150 - 400 K/uL 216  217  245         Latest Ref Rng & Units 04/14/2022    1:44 PM 02/10/2022    9:18 AM 01/20/2022    9:19 AM  CMP  Glucose 70 - 99 mg/dL 112  88  94   BUN 8 - 23 mg/dL '16  18  17   ' Creatinine 0.44 - 1.00 mg/dL 0.59  0.69  0.56   Sodium 135 - 145 mmol/L 142  138  140   Potassium 3.5 - 5.1 mmol/L 3.9  4.2  4.3   Chloride 98 - 111 mmol/L 108  106  108   CO2 22 - 32 mmol/L '27  28  28   ' Calcium 8.9 - 10.3 mg/dL 9.7  9.5  9.8   Total Protein 6.5 - 8.1 g/dL 6.4  5.9  5.7   Total Bilirubin 0.3 - 1.2 mg/dL 0.6  0.5  0.3   Alkaline Phos 38 - 126 U/L 51  42  53   AST 15 - 41 U/L '28  25  24   ' ALT 0 - 44 U/L '18  16  15       ' RADIOGRAPHIC STUDIES: I have personally reviewed the radiological images as listed and agreed with the findings in the report. No results found.    No orders of the defined types were placed in this encounter.  All questions were answered. The patient knows to call the clinic with any problems, questions or concerns. No barriers to learning was detected. The total time spent in the appointment was 30 minutes.     [name], MD 04/14/2022   I, Wilburn Mylar, am acting as scribe for [name], MD.   I have reviewed the above documentation for accuracy and completeness, and I agree with the above.

## 2022-04-14 NOTE — Patient Instructions (Signed)
[facility] MEDICAL ONCOLOGY  Discharge Instructions: Thank you for choosing [facility] to provide your oncology and hematology care.   If you have a lab appointment with the Cancer Center, please go directly to the Cancer Center and check in at the registration area.   Wear comfortable clothing and clothing appropriate for easy access to any Portacath or PICC line.   We strive to give you quality time with your provider. You may need to reschedule your appointment if you arrive late (15 or more minutes).  Arriving late affects you and other patients whose appointments are after yours.  Also, if you miss three or more appointments without notifying the office, you may be dismissed from the clinic at the provider's discretion.      For prescription refill requests, have your pharmacy contact our office and allow 72 hours for refills to be completed.    Today you received the following chemotherapy and/or immunotherapy agents: Ogivri      To help prevent nausea and vomiting after your treatment, we encourage you to take your nausea medication as directed.  BELOW ARE SYMPTOMS THAT SHOULD BE REPORTED IMMEDIATELY: *FEVER GREATER THAN 100.4 F (38 C) OR HIGHER *CHILLS OR SWEATING *NAUSEA AND VOMITING THAT IS NOT CONTROLLED WITH YOUR NAUSEA MEDICATION *UNUSUAL SHORTNESS OF BREATH *UNUSUAL BRUISING OR BLEEDING *URINARY PROBLEMS (pain or burning when urinating, or frequent urination) *BOWEL PROBLEMS (unusual diarrhea, constipation, pain near the anus) TENDERNESS IN MOUTH AND THROAT WITH OR WITHOUT PRESENCE OF ULCERS (sore throat, sores in mouth, or a toothache) UNUSUAL RASH, SWELLING OR PAIN  UNUSUAL VAGINAL DISCHARGE OR ITCHING   Items with * indicate a potential emergency and should be followed up as soon as possible or go to the Emergency Department if any problems should occur.  Please show the CHEMOTHERAPY ALERT CARD or IMMUNOTHERAPY ALERT CARD at check-in to the  Emergency Department and triage nurse.  Should you have questions after your visit or need to cancel or reschedule your appointment, please contact [facility] MEDICAL ONCOLOG[facility]  Dept: [phone]  and follow the prompts.  Office hours are 8:00 a.m. to 4:30 p.m. Monday - Friday. Please note that voicemails left after 4:00 p.m. may not be returned until the following business day.  We are closed weekends and major holidays. You have access to a nurse at all times for urgent questions. Please call the main number to the clinic Dept: [phone] and follow the prompts.   For any non-urgent questions, you may also contact your provider using MyChart. We now offer e-Visits for anyone 18 and older to request care online for non-urgent symptoms. For details visit [email or web address].   Also download the MyChart app! Go to the app store, search "MyChart", open the app, select [facility], and log in with your MyChart username and password.  Masks are optional in the cancer centers. If you would like for your care team to wear a mask while they are taking care of you, please let them know. You may have one support person who is at least 74 years old accompany you for your appointments. 

## 2022-04-15 ENCOUNTER — Other Ambulatory Visit: Payer: Self-pay

## 2022-04-15 ENCOUNTER — Ambulatory Visit
Admission: RE | Admit: 2022-04-15 | Discharge: 2022-04-15 | Disposition: A | Discharge status: Home / Self Care | Payer: Medicare Other | Source: Ambulatory Visit | Attending: Radiation Oncology | Admitting: Radiation Oncology

## 2022-04-15 DIAGNOSIS — R9431 Abnormal electrocardiogram [ECG] [EKG]: Secondary | ICD-10-CM

## 2022-04-15 DIAGNOSIS — C50412 Malignant neoplasm of upper-outer quadrant of left female breast: Secondary | ICD-10-CM

## 2022-04-15 DIAGNOSIS — Z5112 Encounter for antineoplastic immunotherapy: Secondary | ICD-10-CM | POA: Diagnosis not present

## 2022-04-15 LAB — RAD ONC ARIA SESSION SUMMARY
Course Elapsed Days: 23
Plan Fractions Treated to Date: 2
Plan Prescribed Dose Per Fraction: 2 Gy
Plan Total Fractions Prescribed: 4
Plan Total Prescribed Dose: 8 Gy
Reference Point Dosage Given to Date: 4 Gy
Reference Point Session Dosage Given: 2 Gy
Session Number: 18

## 2022-04-16 ENCOUNTER — Other Ambulatory Visit: Payer: Self-pay

## 2022-04-16 ENCOUNTER — Ambulatory Visit
Admission: RE | Admit: 2022-04-16 | Discharge: 2022-04-16 | Disposition: A | Discharge status: Home / Self Care | Payer: Medicare Other | Source: Ambulatory Visit | Attending: Radiation Oncology | Admitting: Radiation Oncology

## 2022-04-16 DIAGNOSIS — Z5112 Encounter for antineoplastic immunotherapy: Secondary | ICD-10-CM | POA: Diagnosis not present

## 2022-04-16 LAB — RAD ONC ARIA SESSION SUMMARY
Course Elapsed Days: 24
Plan Fractions Treated to Date: 3
Plan Prescribed Dose Per Fraction: 2 Gy
Plan Total Fractions Prescribed: 4
Plan Total Prescribed Dose: 8 Gy
Reference Point Dosage Given to Date: 6 Gy
Reference Point Session Dosage Given: 2 Gy
Session Number: 19

## 2022-04-19 ENCOUNTER — Encounter: Payer: Self-pay | Admitting: Radiation Oncology

## 2022-04-19 ENCOUNTER — Other Ambulatory Visit: Payer: Self-pay

## 2022-04-19 ENCOUNTER — Ambulatory Visit
Admission: RE | Admit: 2022-04-19 | Discharge: 2022-04-19 | Disposition: A | Discharge status: Home / Self Care | Payer: Medicare Other | Source: Ambulatory Visit | Attending: Radiation Oncology | Admitting: Radiation Oncology

## 2022-04-19 DIAGNOSIS — Z5112 Encounter for antineoplastic immunotherapy: Secondary | ICD-10-CM | POA: Diagnosis not present

## 2022-04-19 LAB — RAD ONC ARIA SESSION SUMMARY
Course Elapsed Days: 27
Plan Fractions Treated to Date: 4
Plan Prescribed Dose Per Fraction: 2 Gy
Plan Total Fractions Prescribed: 4
Plan Total Prescribed Dose: 8 Gy
Reference Point Dosage Given to Date: 8 Gy
Reference Point Session Dosage Given: 2 Gy
Session Number: 20

## 2022-04-20 ENCOUNTER — Ambulatory Visit (HOSPITAL_COMMUNITY)
Admission: RE | Admit: 2022-04-20 | Discharge: 2022-04-20 | Disposition: A | Discharge status: Home / Self Care | Payer: Medicare Other | Source: Ambulatory Visit | Attending: Hematology | Admitting: Hematology

## 2022-04-20 DIAGNOSIS — R9431 Abnormal electrocardiogram [ECG] [EKG]: Secondary | ICD-10-CM | POA: Insufficient documentation

## 2022-04-20 DIAGNOSIS — Z170 Estrogen receptor positive status [ER+]: Secondary | ICD-10-CM | POA: Insufficient documentation

## 2022-04-20 DIAGNOSIS — C50412 Malignant neoplasm of upper-outer quadrant of left female breast: Secondary | ICD-10-CM

## 2022-04-20 DIAGNOSIS — Z0189 Encounter for other specified special examinations: Secondary | ICD-10-CM | POA: Diagnosis not present

## 2022-04-20 DIAGNOSIS — I351 Nonrheumatic aortic (valve) insufficiency: Secondary | ICD-10-CM | POA: Insufficient documentation

## 2022-04-20 LAB — ECHOCARDIOGRAM COMPLETE
AR max vel: 2.77 cm2
AV Area VTI: 2.67 cm2
AV Area mean vel: 2.56 cm2
AV Mean grad: 3 mmHg
AV Peak grad: 5.1 mmHg
Ao pk vel: 1.13 m/s
Area-P 1/2: 3.42 cm2
S' Lateral: 2.2 cm

## 2022-04-22 ENCOUNTER — Encounter: Payer: Self-pay | Admitting: *Deleted

## 2022-05-05 ENCOUNTER — Inpatient Hospital Stay: Payer: Medicare Other | Attending: Hematology

## 2022-05-05 VITALS — BP 160/88 | HR 74 | Temp 98.2°F | Resp 17 | Wt 112.0 lb

## 2022-05-05 DIAGNOSIS — R197 Diarrhea, unspecified: Secondary | ICD-10-CM | POA: Insufficient documentation

## 2022-05-05 DIAGNOSIS — M810 Age-related osteoporosis without current pathological fracture: Secondary | ICD-10-CM | POA: Insufficient documentation

## 2022-05-05 DIAGNOSIS — G629 Polyneuropathy, unspecified: Secondary | ICD-10-CM | POA: Insufficient documentation

## 2022-05-05 DIAGNOSIS — Z5112 Encounter for antineoplastic immunotherapy: Secondary | ICD-10-CM | POA: Insufficient documentation

## 2022-05-05 DIAGNOSIS — Z170 Estrogen receptor positive status [ER+]: Secondary | ICD-10-CM | POA: Insufficient documentation

## 2022-05-05 DIAGNOSIS — C50412 Malignant neoplasm of upper-outer quadrant of left female breast: Secondary | ICD-10-CM | POA: Diagnosis present

## 2022-05-05 DIAGNOSIS — Z79899 Other long term (current) drug therapy: Secondary | ICD-10-CM | POA: Diagnosis not present

## 2022-05-05 MED ORDER — ACETAMINOPHEN 325 MG PO TABS
650.0000 mg | ORAL_TABLET | Freq: Once | ORAL | Status: AC
  Administered 2022-05-05: 650 mg via ORAL
  Filled 2022-05-05: qty 2

## 2022-05-05 MED ORDER — SODIUM CHLORIDE 0.9 % IV SOLN: Freq: Once | INTRAVENOUS | Status: AC

## 2022-05-05 MED ORDER — SODIUM CHLORIDE 0.9% FLUSH
10.0000 mL | INTRAVENOUS | Status: DC | PRN
  Administered 2022-05-05: 10 mL

## 2022-05-05 MED ORDER — HEPARIN SOD (PORK) LOCK FLUSH 100 UNIT/ML IV SOLN
500.0000 [IU] | Freq: Once | INTRAVENOUS | Status: AC | PRN
  Administered 2022-05-05: 500 [IU]

## 2022-05-05 MED ORDER — DIPHENHYDRAMINE HCL 25 MG PO CAPS
25.0000 mg | ORAL_CAPSULE | Freq: Once | ORAL | Status: AC
  Administered 2022-05-05: 25 mg via ORAL
  Filled 2022-05-05: qty 1

## 2022-05-05 MED ORDER — TRASTUZUMAB-DKST CHEMO 150 MG IV SOLR
6.0000 mg/kg | Freq: Once | INTRAVENOUS | Status: AC
  Administered 2022-05-05: 315 mg via INTRAVENOUS
  Filled 2022-05-05: qty 15

## 2022-05-05 NOTE — Patient Instructions (Signed)
[facility] MEDICAL ONCOLOGY  Discharge Instructions: Thank you for choosing [facility] to provide your oncology and hematology care.   If you have a lab appointment with the Cancer Center, please go directly to the Cancer Center and check in at the registration area.   Wear comfortable clothing and clothing appropriate for easy access to any Portacath or PICC line.   We strive to give you quality time with your provider. You may need to reschedule your appointment if you arrive late (15 or more minutes).  Arriving late affects you and other patients whose appointments are after yours.  Also, if you miss three or more appointments without notifying the office, you may be dismissed from the clinic at the provider's discretion.      For prescription refill requests, have your pharmacy contact our office and allow 72 hours for refills to be completed.    Today you received the following chemotherapy and/or immunotherapy agents: Ogivri      To help prevent nausea and vomiting after your treatment, we encourage you to take your nausea medication as directed.  BELOW ARE SYMPTOMS THAT SHOULD BE REPORTED IMMEDIATELY: *FEVER GREATER THAN 100.4 F (38 C) OR HIGHER *CHILLS OR SWEATING *NAUSEA AND VOMITING THAT IS NOT CONTROLLED WITH YOUR NAUSEA MEDICATION *UNUSUAL SHORTNESS OF BREATH *UNUSUAL BRUISING OR BLEEDING *URINARY PROBLEMS (pain or burning when urinating, or frequent urination) *BOWEL PROBLEMS (unusual diarrhea, constipation, pain near the anus) TENDERNESS IN MOUTH AND THROAT WITH OR WITHOUT PRESENCE OF ULCERS (sore throat, sores in mouth, or a toothache) UNUSUAL RASH, SWELLING OR PAIN  UNUSUAL VAGINAL DISCHARGE OR ITCHING   Items with * indicate a potential emergency and should be followed up as soon as possible or go to the Emergency Department if any problems should occur.  Please show the CHEMOTHERAPY ALERT CARD or IMMUNOTHERAPY ALERT CARD at check-in to the  Emergency Department and triage nurse.  Should you have questions after your visit or need to cancel or reschedule your appointment, please contact [facility]ICAL ONCOL[facility]  Dept: [phone]  and follow the prompts.  Office hours are 8:00 a.m. to 4:30 p.m. Monday - Friday. Please note that voicemails left after 4:00 p.m. may not be returned until the following business day.  We are closed weekends and major holidays. You have access to a nurse at all times for urgent questions. Please call the main number to the clinic Dept: [phone] and follow the prompts.   For any non-urgent questions, you may also contact your provider using MyChart. We now offer e-Visits for anyone 18 and older to request care online for non-urgent symptoms. For details visit [email or web address].   Also download the MyChart app! Go to the app store, search "MyChart", open the app, select [facility], and log in with your MyChart username and password.  Masks are optional in the cancer centers. If you would like for your care team to wear a mask while they are taking care of you, please let them know. You may have one support Katherine Donovan who is at least 74 years old accompany you for your appointments. 

## 2022-05-10 ENCOUNTER — Ambulatory Visit: Payer: Medicare Other | Attending: General Surgery

## 2022-05-10 ENCOUNTER — Encounter: Payer: Self-pay | Admitting: Hematology

## 2022-05-10 VITALS — Wt 110.5 lb

## 2022-05-10 DIAGNOSIS — Z483 Aftercare following surgery for neoplasm: Secondary | ICD-10-CM | POA: Insufficient documentation

## 2022-05-10 NOTE — Progress Notes (Signed)
                                                                                                                                                             Patient Name: Katherine Donovan MRN: [id number] DOB: Nov 16, 1945 Referring Physician: Terrill Mohr Date of Service: 04/19/2022 [facility] [place]                                                        End Of Treatment Note  Diagnoses: C50.412-Malignant neoplasm of upper-outer quadrant of left female breast  Cancer Staging: Stage IA, cT1cN0M0 grade 3, ER positive, HER2 amplfied invasive lobular carcinoma of the left breast with complete response to systemic therapy   Intent: Curative  Radiation Treatment Dates: 03/23/2022 through 04/19/2022 Site Technique Total Dose (Gy) Dose per Fx (Gy) Completed Fx Beam Energies  Breast, Left: Breast_L 3D 42.56/42.56 2.66 16/16 6XFFF  Breast, Left: Breast_L_Bst 3D 8/8 2 4/4 6X   Narrative: The patient tolerated radiation therapy relatively well. She developed fatigue and anticipated skin changes in the treatment field.   Plan: The patient will receive a call in about one month from the radiation oncology department. She will continue follow up with Dr. Burr Medico as well.   ________________________________________________    Carola Rhine, [facility]

## 2022-05-10 NOTE — Therapy (Signed)
  OUTPATIENT PHYSICAL THERAPY SOZO SCREENING NOTE   Patient Name: Katherine Donovan MRN: [id number] DOB:1946/06/29, 76 y.o., female Today's Date: 05/10/2022  PCP: Nickola Major, MD REFERRING PROVIDER: [name], MD   PT End of Session - 05/10/22 [phone]     Visit Number 2   # unchanged due to screen only   PT Start Time 0935    PT Stop Time 0940    PT Time Calculation (min) 5 min    Activity Tolerance Patient tolerated treatment well    Behavior During Therapy [facility] for tasks assessed/performed             Past Medical History:  Diagnosis Date   History of bunionectomy 09/02/2020   Hyperlipidemia    Hyperlipidemia    left breast ILC 09/15/21   Osteoporosis    Rosacea    to face   Past Surgical History:  Procedure Laterality Date   BREAST LUMPECTOMY WITH RADIOACTIVE SEED AND SENTINEL LYMPH NODE BIOPSY Left 02/04/2022   Procedure: LEFT BREAST SEED GUIDED LUMPECTOMY AND LEFT AXILLARY SENTINEL NODE BIOPSY;  Surgeon: [name], MD;  Location: [place];  Service: General;  Laterality: Left;  LMA & PEC BLOCK   PORTACATH PLACEMENT Right 10/06/2021   Procedure: INSERTION PORT-A-CATH;  Surgeon: [name], MD;  Location: [place];  Service: General;  Laterality: Right;   [place]     Patient Active Problem List   Diagnosis Date Noted   Encounter for immunization 10/19/2021   Port-A-Cath in place 10/14/2021   Malignant neoplasm of upper-outer quadrant of left breast in female, estrogen receptor positive ([place]) 09/18/2021   Pure hypercholesterolemia 09/18/2015   Age-related osteoporosis with current pathological fracture with routine healing 09/18/2015    REFERRING DIAG: left breast cancer at risk for lymphedema  THERAPY DIAG: Aftercare following surgery for neoplasm  PERTINENT HISTORY: Patient was diagnosed on 08/21/2021 with 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 It  measures 3 cm and is located in the upper outer quadrant. It is ER positive, PR negative and HER2 positive with a Ki67 of 15%. Neoadjuvant chemotherapy TCHP. Left lumpectomy on 02/04/22 with 6 negative lymph nodes. Will be having radiation. Has osteoporosis.     PRECAUTIONS: left UE Lymphedema risk, None  SUBJECTIVE: Pt returns for her first 3 month L-Dex screen.   PAIN:  Are you having pain? No  SOZO SCREENING: Patient was assessed today using the SOZO machine to determine the lymphedema index score. This was compared to her baseline score. It was determined that she is within the recommended range when compared to her baseline and no further action is needed at this time. She will continue SOZO screenings. These are done every 3 months for 2 years post operatively followed by every 6 months for 2 years, and then annually.   L-DEX FLOWSHEETS - 05/10/22 0900       L-DEX LYMPHEDEMA SCREENING   Measurement Type Unilateral    L-DEX MEASUREMENT EXTREMITY Upper Extremity    POSITION  Standing    DOMINANT SIDE Right    At Risk Side Left    BASELINE SCORE (UNILATERAL) 2.6    L-DEX SCORE (UNILATERAL) 5.6    VALUE CHANGE (UNILAT) 3               Otelia Limes, PTA 05/10/2022, 9:40 AM

## 2022-05-26 ENCOUNTER — Inpatient Hospital Stay: Payer: Medicare Other

## 2022-05-26 ENCOUNTER — Other Ambulatory Visit: Payer: Self-pay

## 2022-05-26 VITALS — BP 132/71 | HR 69 | Temp 98.0°F | Resp 17 | Wt 113.0 lb

## 2022-05-26 DIAGNOSIS — Z5112 Encounter for antineoplastic immunotherapy: Secondary | ICD-10-CM | POA: Diagnosis not present

## 2022-05-26 DIAGNOSIS — Z170 Estrogen receptor positive status [ER+]: Secondary | ICD-10-CM

## 2022-05-26 MED ORDER — SODIUM CHLORIDE 0.9 % IV SOLN: Freq: Once | INTRAVENOUS | Status: AC

## 2022-05-26 MED ORDER — SODIUM CHLORIDE 0.9% FLUSH
10.0000 mL | INTRAVENOUS | Status: DC | PRN
  Administered 2022-05-26: 10 mL

## 2022-05-26 MED ORDER — HEPARIN SOD (PORK) LOCK FLUSH 100 UNIT/ML IV SOLN
500.0000 [IU] | Freq: Once | INTRAVENOUS | Status: AC | PRN
  Administered 2022-05-26: 500 [IU]

## 2022-05-26 MED ORDER — DIPHENHYDRAMINE HCL 25 MG PO CAPS
25.0000 mg | ORAL_CAPSULE | Freq: Once | ORAL | Status: AC
  Administered 2022-05-26: 25 mg via ORAL
  Filled 2022-05-26: qty 1

## 2022-05-26 MED ORDER — ACETAMINOPHEN 325 MG PO TABS
650.0000 mg | ORAL_TABLET | Freq: Once | ORAL | Status: AC
  Administered 2022-05-26: 650 mg via ORAL
  Filled 2022-05-26: qty 2

## 2022-05-26 MED ORDER — TRASTUZUMAB-DKST CHEMO 150 MG IV SOLR
6.0000 mg/kg | Freq: Once | INTRAVENOUS | Status: AC
  Administered 2022-05-26: 315 mg via INTRAVENOUS
  Filled 2022-05-26: qty 15

## 2022-05-26 NOTE — Patient Instructions (Signed)
[facility] MEDICAL [facility]COLOGY  Discharge Instructions: Thank you for choosing [facility] to provide your oncology and hematology care.   If you have a lab appointment with the Cancer Center, please go directly to the Cancer Center and check in at the registration area.   Wear comfortable clothing and clothing appropriate for easy access to any Portacath or PICC line.   We strive to give you quality time with your provider. You may need to reschedule your appointment if you arrive late (15 or more minutes).  Arriving late affects you and other patients whose appointments are after yours.  Also, if you miss three or more appointments without notifying the office, you may be dismissed from the clinic at the provider's discretion.      For prescription refill requests, have your pharmacy contact our office and allow 72 hours for refills to be completed.    Today you received the following chemotherapy and/or immunotherapy agents: Ogivri      To help prevent nausea and vomiting after your treatment, we encourage you to take your nausea medication as directed.  BELOW ARE SYMPTOMS THAT SHOULD BE REPORTED IMMEDIATELY: *FEVER GREATER THAN 100.4 F (38 C) OR HIGHER *CHILLS OR SWEATING *NAUSEA AND VOMITING THAT IS NOT CONTROLLED WITH YOUR NAUSEA MEDICATION *UNUSUAL SHORTNESS OF BREATH *UNUSUAL BRUISING OR BLEEDING *URINARY PROBLEMS (pain or burning when urinating, or frequent urination) *BOWEL PROBLEMS (unusual diarrhea, constipation, pain near the anus) TENDERNESS IN MOUTH AND THROAT WITH OR WITHOUT PRESENCE OF ULCERS (sore throat, sores in mouth, or a toothache) UNUSUAL RASH, SWELLING OR PAIN  UNUSUAL VAGINAL DISCHARGE OR ITCHING   Items with * indicate a potential emergency and should be followed up as soon as possible or go to the Emergency Department if any problems should occur.  Please show the CHEMOTHERAPY ALERT CARD or IMMUNOTHERAPY ALERT CARD at check-in to the  Emergency Department and triage nurse.  Should you have questions after your visit or need to cancel or reschedule your appointment, please contact [facility]  Dept: [phone]  and follow the prompts.  Office hours are 8:00 a.m. to 4:30 p.m. Monday - Friday. Please note that voicemails left after 4:00 p.m. may not be returned until the following business day.  We are closed weekends and major holidays. You have access to a nurse at all times for urgent questions. Please call the main number to the clinic Dept: [phone] and follow the prompts.   For any non-urgent questions, you may also contact your provider using MyChart. We now offer e-Visits for anyone 18 and older to request care online for non-urgent symptoms. For details visit [email or web address].   Also download the MyChart app! Go to the app store, search "MyChart", open the app, select [facility], and log in with your MyChart username and password.  Masks are optional in the cancer centers. If you would like for your care team to wear a mask while they are taking care of you, please let them know. You may have one support person who is at least 74 years old accompany you for your appointments. 

## 2022-05-31 ENCOUNTER — Ambulatory Visit
Admission: RE | Admit: 2022-05-31 | Discharge: 2022-05-31 | Disposition: A | Discharge status: Home / Self Care | Payer: Medicare Other | Source: Ambulatory Visit | Attending: Radiation Oncology | Admitting: Radiation Oncology

## 2022-05-31 NOTE — Progress Notes (Signed)
  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 A voicemail was left.  The patient was encouraged to avoid sun exposure in the area of prior treatment for up to one year following radiation with either sunscreen or by the style of clothing worn in the sun.  The patient has scheduled follow up with her medical oncologist Dr. Burr Medico for ongoing surveillance, and was encouraged to call if she develops concerns or questions regarding radiation.   This concludes the interview.   Leandra Kern, LPN

## 2022-06-14 NOTE — Progress Notes (Unsigned)
CLINIC:  Survivorship   REASON FOR VISIT:  Routine follow-up post-treatment for a recent history of breast cancer.  BRIEF ONCOLOGIC HISTORY:  Oncology History Overview Note   Cancer Staging  Malignant neoplasm of upper-outer quadrant of left breast in female, estrogen receptor positive ([place]) Staging form: Breast, AJCC 8th Edition - Clinical stage from 09/14/2021: Stage IA (cT1c, cN0, cM0, G3, ER+, PR-, HER2+) - Signed by [name], MD on 09/22/2021    Malignant neoplasm of upper-outer quadrant of left breast in female, estrogen receptor positive ([place])  09/01/2021 Mammogram   CLINICAL DATA:  Bilateral screening recall for a right breast asymmetry and left breast mass.   EXAM: DIGITAL DIAGNOSTIC BILATERAL MAMMOGRAM WITH TOMOSYNTHESIS AND CAD; ULTRASOUND LEFT BREAST LIMITED; ULTRASOUND RIGHT BREAST LIMITED  IMPRESSION: 1. There is a highly suspicious ill-defined mass in the upper-outer left breast. The mass measures at least 2.9 cm, and possibly up to 3.8 cm. Accurate measurement is difficult due to its ill-defined insinuating appearance.   2.  No evidence of left axillary lymphadenopathy.   3. No persistent suspicious mammographic or targeted sonographic abnormalities in the right breast.   09/14/2021 Cancer Staging   Staging form: Breast, AJCC 8th Edition - Clinical stage from 09/14/2021: Stage IA (cT1c, cN0, cM0, G3, ER+, PR-, HER2+) - Signed by [name], MD on 09/22/2021 Stage prefix: Initial diagnosis Histologic grading system: 3 grade system   09/14/2021 Initial Biopsy   Diagnosis Breast, left, needle core biopsy, 1 o'clock, 4cmfn - INVASIVE MAMMARY CARCINOMA. SEE NOTE Diagnosis Note Carcinoma measures 1.3 cm in greatest linear dimension and appears grade 2-3.  Immunohistochemical stain for E-cadherin is negative, consistent with a lobular phenotype.  PROGNOSTIC INDICATORS Results: The tumor cells are POSITIVE for Her2 (3+). Estrogen Receptor: 50%, POSITIVE,  MODERATE-WEAK STAINING INTENSITY Progesterone Receptor: 0%, NEGATIVE Proliferation Marker Ki67: 15%   09/18/2021 Initial Diagnosis   Malignant neoplasm of upper-outer quadrant of left breast in female, estrogen receptor positive ([place])   09/21/2021 Imaging   EXAM: BILATERAL BREAST MRI WITH AND WITHOUT CONTRAST  IMPRESSION: 1. 3 cm biopsy-proven malignancy within the UPPER-OUTER LEFT breast. No evidence of multifocal, multicentric or contralateral malignancy. No abnormal lymph nodes.   10/07/2021 - 02/10/2022 Chemotherapy   Patient is on Treatment Plan : BREAST  Docetaxel + Carboplatin + Trastuzumab + Pertuzumab  (TCHP) q21d      10/16/2021 Genetic Testing   Negative genetic testing: no pathogenic variants detected in [facility] CancerNext-Expanded +RNAinsight Panel.  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 Report date is October 16, 2021.   The CancerNext-Expanded gene panel offered by [facility] and includes sequencing, rearrangement, and RNA analysis for the following 77 genes: AIP, ALK, APC, ATM, AXIN2, BAP1, BARD1, BLM, BMPR1A, BRCA1, BRCA2, BRIP1, CDC73, CDH1, CDK4, CDKN1B, CDKN2A, CHEK2, CTNNA1, DICER1, FANCC, FH, FLCN, GALNT12, KIF1B, LZTR1, MAX, MEN1, MET, MLH1, MSH2, MSH3, MSH6, MUTYH, NBN, NF1, NF2, NTHL1, PALB2, PHOX2B, PMS2, POT1, PRKAR1A, PTCH1, PTEN, RAD51C, RAD51D, RB1, RECQL, RET, SDHA, SDHAF2, SDHB, SDHC, SDHD, SMAD4, SMARCA4, SMARCB1, SMARCE1, STK11, SUFU, TMEM127, TP53, TSC1, TSC2, VHL and XRCC2 (sequencing and deletion/duplication); EGFR, EGLN1, HOXB13, KIT, MITF, PDGFRA, POLD1, and POLE (sequencing only); EPCAM and GREM1 (deletion/duplication only).   02/04/2022 Definitive Surgery   Lumpectomy by Dr. Georgiann Mccoy. BREAST, LEFT, LUMPECTOMY:  - Hyaline fibrosis and focal calcifications with no evidence of residual carcinoma - complete therapeutic response  - Mild fibrocystic change  - See comment  B. LYMPH NODE, LEFT AXILLA, SENTINEL, EXCISION:  -  Lymph node, negative for carcinoma (0/1)  C. LYMPH NODE, LEFT AXILLA, SENTINEL, EXCISION:  - Lymph node, negative for carcinoma (0/1)  D. LYMPH NODE, LEFT AXILLA, SENTINEL, EXCISION:  - Lymph node, negative for carcinoma (0/1)  E. LYMPH NODE, LEFT AXILLA, SENTINEL, EXCISION:  - Lymph node, negative for carcinoma (0/1)  F. LYMPH NODE, LEFT AXILLA, SENTINEL, EXCISION:  - Lymph node, negative for carcinoma (0/1)  G. LYMPH NODE, LEFT AXILLA, SENTINEL, EXCISION:  - Lymph node, negative for carcinoma (0/1)  H. BREAST, LEFT INFERIOR MARGIN, EXCISION:  - Hyaline fibrosis with no evidence of residual carcinoma  - Mild fibrocystic change with calcification   COMMENT:  The appropriate pathologic stage is ypT0 ypN0    02/04/2022 Cancer Staging   Staging form: Breast, AJCC 8th Edition - Pathologic stage from 02/04/2022: No Stage Recommended (ypT0, pN0(sn), cM0) - Signed by Alla Feeling, NP on 02/10/2022 Stage prefix: Post-therapy Response to neoadjuvant therapy: Complete response Method of lymph node assessment: Sentinel lymph node biopsy   03/03/2022 -  Chemotherapy   Patient is on Treatment Plan : BREAST Trastuzumab IV (8/6) or SQ (600) D1 q21d       INTERVAL HISTORY:  Katherine Donovan presents to the [facility] today for our initial meeting to review her survivorship care plan detailing her treatment course for breast cancer, as well as monitoring long-term side effects of that treatment, education regarding health maintenance, screening, and overall wellness and health promotion.     Overall, Katherine Donovan reports feeling quite well since completing her radiation therapy approximately 3 months ago.  She ***    REVIEW OF SYSTEMS:  Review of Systems - Oncology Breast: Denies any new nodularity, masses, tenderness, nipple changes, or nipple discharge.      ONCOLOGY TREATMENT TEAM:  1. Surgeon:  Dr[facility] Marland Kitchen at [facility] 2. Medical Oncologist: Dr. Marland Kitchen  3. Radiation Oncologist:  Dr. Marland Kitchen    PAST MEDICAL/SURGICAL HISTORY:  Past Medical History:  Diagnosis Date   History of bunionectomy 09/02/2020   Hyperlipidemia    Hyperlipidemia    left breast ILC 09/15/21   Osteoporosis    Rosacea    to face   Past Surgical History:  Procedure Laterality Date   BREAST LUMPECTOMY WITH RADIOACTIVE SEED AND SENTINEL LYMPH NODE BIOPSY Left 02/04/2022   Procedure: LEFT BREAST SEED GUIDED LUMPECTOMY AND LEFT AXILLARY SENTINEL NODE BIOPSY;  Surgeon: [name], MD;  Location: [place];  Service: General;  Laterality: Left;  LMA & PEC BLOCK   PORTACATH PLACEMENT Right 10/06/2021   Procedure: INSERTION PORT-A-CATH;  Surgeon: [name], MD;  Location: [place];  Service: General;  Laterality: Right;   [place]:  No Known Allergies   CURRENT MEDICATIONS:  Outpatient Encounter Medications as of 06/16/2022  Medication Sig   alendronate (FOSAMAX) 70 MG tablet Take 70 mg by mouth once a week. Take with a full glass of water on an empty stomach.   Calcium Carbonate-Vitamin D 600-400 MG-UNIT tablet Take 1 tablet by mouth daily.     CRESTOR 10 MG tablet Take 5 mg by mouth Daily.   Glucosamine 750 MG TABS Take 750 mg by mouth daily.     Lactobacillus Rhamnosus, GG, (RA PROBIOTIC DIGESTIVE CARE) CAPS Take 1 capsule by mouth 3 (three) times a week.   Misc Natural Products (JOINT SUPPORT COMPLEX PO) Take by mouth daily. [employer]-Cartilage-Bone Triple Action Joint  Health; Undenatured Type II Collagen   Multiple Vitamin (MULTI-VITAMIN) tablet Take 1 tablet by mouth daily.   tamoxifen (NOLVADEX) 20 MG tablet Take 1 tablet (20 mg total) by mouth daily.   [DISCONTINUED] prochlorperazine (COMPAZINE) 10 MG tablet Take 1 tablet (10 mg total) by mouth every 6 (six) hours as needed (Nausea or vomiting).   No facility-administered encounter medications on file as of 06/16/2022.     ONCOLOGIC FAMILY  HISTORY:  Family History  Problem Relation Age of Onset   Melanoma Mother        or other skin cancer; dx after 74; no systemic therapy needed   Hypertension Father    Heart attack Father    Other Sister 45       benign breast mass   Obesity Sister    Other Sister 41       benign breast mass   Cancer Paternal Uncle        unknown type; mets; d. late 53s   Colon cancer Neg Hx      GENETIC COUNSELING/TESTING: ***  SOCIAL HISTORY:  PERSIA LINTNER is /single/married/divorced/widowed/separated and lives alone/with her spouse/family/friend in (city), [place].  She has (#) children and they live in (city).  Katherine Donovan is currently retired/disabled/working part-time/full-time as ***.  She denies any current or history of tobacco, alcohol, or illicit drug use.     PHYSICAL EXAMINATION:  Vital Signs:  There were no vitals filed for this visit. There were no vitals filed for this visit. General: Well-nourished, well-appearing female in no acute distress.  She is unaccompanied/accompanied in clinic by her ***** today.   HEENT: Head is normocephalic.  Pupils equal and reactive to light. Conjunctivae clear without exudate.  Sclerae anicteric. Oral mucosa is pink, moist.  Oropharynx is pink without lesions or erythema.  Lymph: No cervical, supraclavicular, or infraclavicular lymphadenopathy noted on palpation.  Cardiovascular: Regular rate and rhythm.Marland Kitchen Respiratory: Clear to auscultation bilaterally. Chest expansion symmetric; breathing non-labored.  GI: Abdomen soft and round; non-tender, non-distended. Bowel sounds normoactive.  GU: Deferred.  Neuro: No focal deficits. Steady gait.  Psych: Mood and affect normal and appropriate for situation.  Extremities: No edema. MSK: No focal spinal tenderness to palpation.  Full range of motion in bilateral upper extremities Skin: Warm and dry.  LABORATORY DATA:  None for this visit.  DIAGNOSTIC IMAGING:  None for this visit.       ASSESSMENT AND PLAN:  Ms.. Donovan is a pleasant 76 y.o. female with Stage *** right/left breast invasive ductal carcinoma, ER+/PR+/HER2-, diagnosed in (date), treated with lumpectomy, adjuvant radiation therapy, and anti-estrogen therapy with *** beginning in (date).  She presents to the Survivorship Clinic for our initial meeting and routine follow-up post-completion of treatment for breast cancer.    1. Stage *** right/left breast cancer:  Katherine Donovan is continuing to recover from definitive treatment for breast cancer. She will follow-up with her medical oncologist, Dr. Ross Ludwig in (month) /2017 with history and physical exam per surveillance protocol.  She will continue her anti-estrogen therapy with (drug). Thus far, she is tolerating the *** well, with minimal side effects. She was instructed to make Dr. Lindi Adie or myself aware if she begins to experience any worsening side effects of the medication and I could see her back in clinic to help manage those side effects, as needed. Though the incidence is low, there is an associated risk of endometrial cancer with anti-estrogen therapies like Tamoxifen.  Katherine Donovan was encouraged to contact  Dr. Carrington Clamp or myself with any vaginal bleeding while taking Tamoxifen. Other side effects of Tamoxifen were again reviewed with her as well. Today, a comprehensive survivorship care plan and treatment summary was reviewed with the patient today detailing her breast cancer diagnosis, treatment course, potential late/long-term effects of treatment, appropriate follow-up care with recommendations for the future, and patient education resources.  A copy of this summary, along with a letter will be sent to the patient's primary care provider via mail/fax/In Basket message after today's visit.    #. Problem(s) at Visit______________  #. Bone health:  Given Katherine Donovan's age/history of breast cancer and her current treatment regimen including  anti-estrogen therapy with _______, she is at risk for bone demineralization.  Her last DEXA scan was **/**/20**, which showed (results).***  In the meantime, she was encouraged to increase her consumption of foods rich in calcium, as well as increase her weight-bearing activities.  She was given education on specific activities to promote bone health.  #. Cancer screening:  Due to Katherine Donovan's history and her age, she should receive screening for skin cancers, colon cancer, and gynecologic cancers.  The information and recommendations are listed on the patient's comprehensive care plan/treatment summary and were reviewed in detail with the patient.    #. Health maintenance and wellness promotion: Katherine Donovan was encouraged to consume 5-7 servings of fruits and vegetables per day. We reviewed the "Nutrition Rainbow" handout, as well as the handout "Take Control of Your Health and Reduce Your Cancer Risk" from the [place].  She was also encouraged to engage in moderate to vigorous exercise for 30 minutes per day most days of the week. We discussed the [employer] fitness program, which is designed for cancer survivors to help them become more physically fit after cancer treatments.  She was instructed to limit her alcohol consumption and continue to abstain from tobacco use/***was encouraged stop smoking.     #. Support services/counseling: It is not uncommon for this period of the patient's cancer care trajectory to be one of many emotions and stressors.  We discussed an opportunity for her to participate in the next session of [facility] ("Finding Your New Normal") support group series designed for patients after they have completed treatment.   Katherine Donovan was encouraged to take advantage of our many other support services programs, support groups, and/or counseling in coping with her new life as a cancer survivor after completing anti-cancer treatment.  She was offered support today through  active listening and expressive supportive counseling.  She was given information regarding our available services and encouraged to contact me with any questions or for help enrolling in any of our support group/programs.    Dispo:   -Return to cancer center ***  -Mammogram due in *** -Follow up with surgery *** -She is welcome to return back to the Survivorship Clinic at any time; no additional follow-up needed at this time.  -Consider referral back to survivorship as a long-term survivor for continued surveillance  A total of (30) minutes of face-to-face time was spent with this patient with greater than 50% of that time in counseling and care-coordination.   Cira Rue, NP Survivorship Program [facility] [phone]   Note: PRIMARY CARE PROVIDER Nickola Major, [place] [phone]

## 2022-06-16 ENCOUNTER — Inpatient Hospital Stay (HOSPITAL_BASED_OUTPATIENT_CLINIC_OR_DEPARTMENT_OTHER): Payer: Medicare Other | Admitting: Nurse Practitioner

## 2022-06-16 ENCOUNTER — Inpatient Hospital Stay: Payer: Medicare Other | Attending: Hematology

## 2022-06-16 ENCOUNTER — Inpatient Hospital Stay: Payer: Medicare Other

## 2022-06-16 ENCOUNTER — Encounter: Payer: Self-pay | Admitting: Nurse Practitioner

## 2022-06-16 VITALS — BP 142/75 | HR 82 | Resp 16

## 2022-06-16 VITALS — BP 143/84 | HR 68 | Temp 97.8°F | Resp 16 | Ht 61.0 in | Wt 111.3 lb

## 2022-06-16 DIAGNOSIS — Z170 Estrogen receptor positive status [ER+]: Secondary | ICD-10-CM | POA: Insufficient documentation

## 2022-06-16 DIAGNOSIS — N6320 Unspecified lump in the left breast, unspecified quadrant: Secondary | ICD-10-CM | POA: Diagnosis not present

## 2022-06-16 DIAGNOSIS — Z79810 Long term (current) use of selective estrogen receptor modulators (SERMs): Secondary | ICD-10-CM | POA: Insufficient documentation

## 2022-06-16 DIAGNOSIS — Z79899 Other long term (current) drug therapy: Secondary | ICD-10-CM | POA: Diagnosis not present

## 2022-06-16 DIAGNOSIS — Z5112 Encounter for antineoplastic immunotherapy: Secondary | ICD-10-CM | POA: Diagnosis present

## 2022-06-16 DIAGNOSIS — C50412 Malignant neoplasm of upper-outer quadrant of left female breast: Secondary | ICD-10-CM | POA: Insufficient documentation

## 2022-06-16 DIAGNOSIS — I5020 Unspecified systolic (congestive) heart failure: Secondary | ICD-10-CM | POA: Diagnosis not present

## 2022-06-16 DIAGNOSIS — Z809 Family history of malignant neoplasm, unspecified: Secondary | ICD-10-CM | POA: Insufficient documentation

## 2022-06-16 DIAGNOSIS — Z808 Family history of malignant neoplasm of other organs or systems: Secondary | ICD-10-CM | POA: Insufficient documentation

## 2022-06-16 DIAGNOSIS — N6311 Unspecified lump in the right breast, upper outer quadrant: Secondary | ICD-10-CM | POA: Insufficient documentation

## 2022-06-16 DIAGNOSIS — R200 Anesthesia of skin: Secondary | ICD-10-CM | POA: Insufficient documentation

## 2022-06-16 DIAGNOSIS — N6489 Other specified disorders of breast: Secondary | ICD-10-CM | POA: Diagnosis not present

## 2022-06-16 DIAGNOSIS — Z8249 Family history of ischemic heart disease and other diseases of the circulatory system: Secondary | ICD-10-CM | POA: Diagnosis not present

## 2022-06-16 DIAGNOSIS — Z8349 Family history of other endocrine, nutritional and metabolic diseases: Secondary | ICD-10-CM | POA: Insufficient documentation

## 2022-06-16 DIAGNOSIS — Z95828 Presence of other vascular implants and grafts: Secondary | ICD-10-CM

## 2022-06-16 LAB — CBC WITH DIFFERENTIAL (CANCER CENTER ONLY)
Abs Immature Granulocytes: 0.01 10*3/uL (ref 0.00–0.07)
Basophils Absolute: 0 10*3/uL (ref 0.0–0.1)
Basophils Relative: 1 %
Eosinophils Absolute: 0.2 10*3/uL (ref 0.0–0.5)
Eosinophils Relative: 3 %
HCT: 40.2 % (ref 36.0–46.0)
Hemoglobin: 13.5 g/dL (ref 12.0–15.0)
Immature Granulocytes: 0 %
Lymphocytes Relative: 20 %
Lymphs Abs: 1.3 10*3/uL (ref 0.7–4.0)
MCH: 30.9 pg (ref 26.0–34.0)
MCHC: 33.6 g/dL (ref 30.0–36.0)
MCV: 92 fL (ref 80.0–100.0)
Monocytes Absolute: 0.7 10*3/uL (ref 0.1–1.0)
Monocytes Relative: 10 %
Neutro Abs: 4.4 10*3/uL (ref 1.7–7.7)
Neutrophils Relative %: 66 %
Platelet Count: 228 10*3/uL (ref 150–400)
RBC: 4.37 MIL/uL (ref 3.87–5.11)
RDW: 13.2 % (ref 11.5–15.5)
WBC Count: 6.6 10*3/uL (ref 4.0–10.5)
nRBC: 0 % (ref 0.0–0.2)

## 2022-06-16 LAB — CMP (CANCER CENTER ONLY)
ALT: 14 U/L (ref 0–44)
AST: 23 U/L (ref 15–41)
Albumin: 4 g/dL (ref 3.5–5.0)
Alkaline Phosphatase: 41 U/L (ref 38–126)
Anion gap: 5 (ref 5–15)
BUN: 17 mg/dL (ref 8–23)
CO2: 27 mmol/L (ref 22–32)
Calcium: 10 mg/dL (ref 8.9–10.3)
Chloride: 106 mmol/L (ref 98–111)
Creatinine: 0.69 mg/dL (ref 0.44–1.00)
GFR, Estimated: >60 mL/min (ref >60)
Glucose, Bld: 134 mg/dL — ABNORMAL HIGH (ref 70–99)
Potassium: 4 mmol/L (ref 3.5–5.1)
Sodium: 138 mmol/L (ref 135–145)
Total Bilirubin: 0.4 mg/dL (ref 0.3–1.2)
Total Protein: 5.9 g/dL — ABNORMAL LOW (ref 6.5–8.1)

## 2022-06-16 MED ORDER — SODIUM CHLORIDE 0.9 % IV SOLN: Freq: Once | INTRAVENOUS | Status: AC

## 2022-06-16 MED ORDER — SODIUM CHLORIDE 0.9% FLUSH
10.0000 mL | Freq: Once | INTRAVENOUS | Status: AC
  Administered 2022-06-16: 10 mL

## 2022-06-16 MED ORDER — DIPHENHYDRAMINE HCL 25 MG PO CAPS
25.0000 mg | ORAL_CAPSULE | Freq: Once | ORAL | Status: AC
  Administered 2022-06-16: 25 mg via ORAL
  Filled 2022-06-16: qty 1

## 2022-06-16 MED ORDER — SODIUM CHLORIDE 0.9% FLUSH
10.0000 mL | INTRAVENOUS | Status: DC | PRN
  Administered 2022-06-16: 10 mL

## 2022-06-16 MED ORDER — HEPARIN SOD (PORK) LOCK FLUSH 100 UNIT/ML IV SOLN
500.0000 [IU] | Freq: Once | INTRAVENOUS | Status: AC | PRN
  Administered 2022-06-16: 500 [IU]

## 2022-06-16 MED ORDER — ACETAMINOPHEN 325 MG PO TABS
650.0000 mg | ORAL_TABLET | Freq: Once | ORAL | Status: AC
  Administered 2022-06-16: 650 mg via ORAL
  Filled 2022-06-16: qty 2

## 2022-06-16 MED ORDER — TAMOXIFEN CITRATE 20 MG PO TABS: 20.0000 mg | ORAL_TABLET | Freq: Every day | ORAL | 3 refills | Status: DC

## 2022-06-16 MED ORDER — TRASTUZUMAB-DKST CHEMO 150 MG IV SOLR
6.0000 mg/kg | Freq: Once | INTRAVENOUS | Status: AC
  Administered 2022-06-16: 315 mg via INTRAVENOUS
  Filled 2022-06-16: qty 15

## 2022-06-16 NOTE — Patient Instructions (Addendum)
[facility]  Discharge Instructions: Thank you for choosing [facility] to provide your oncology and hematology care.   If you have a lab appointment with the Cancer Center, please go directly to the Cancer Center and check in at the registration area.   Wear comfortable clothing and clothing appropriate for easy access to any Portacath or PICC line.   We strive to give you quality time with your provider. You may need to reschedule your appointment if you arrive late (15 or more minutes).  Arriving late affects you and other patients whose appointments are after yours.  Also, if you miss three or more appointments without notifying the office, you may be dismissed from the clinic at the provider's discretion.      For prescription refill requests, have your pharmacy contact our office and allow 72 hours for refills to be completed.    Today you received the following chemotherapy and/or immunotherapy agents: Ogivri      To help prevent nausea and vomiting after your treatment, we encourage you to take your nausea medication as directed.  BELOW ARE SYMPTOMS THAT SHOULD BE REPORTED IMMEDIATELY: *FEVER GREATER THAN 100.4 F (38 C) OR HIGHER *CHILLS OR SWEATING *NAUSEA AND VOMITING THAT IS NOT CONTROLLED WITH YOUR NAUSEA MEDICATION *UNUSUAL SHORTNESS OF BREATH *UNUSUAL BRUISING OR BLEEDING *URINARY PROBLEMS (pain or burning when urinating, or frequent urination) *BOWEL PROBLEMS (unusual diarrhea, constipation, pain near the anus) TENDERNESS IN MOUTH AND THROAT WITH OR WITHOUT PRESENCE OF ULCERS (sore throat, sores in mouth, or a toothache) UNUSUAL RASH, SWELLING OR PAIN  UNUSUAL VAGINAL DISCHARGE OR ITCHING   Items with * indicate a potential emergency and should be followed up as soon as possible or go to the Emergency Department if any problems should occur.  Please show the CHEMOTHERAPY ALERT CARD or IMMUNOTHERAPY ALERT CARD at check-in to the  Emergency Department and triage nurse.  Should you have questions after your visit or need to cancel or reschedule your appointment, please contact [facility]  Dept: [phone]  and follow the prompts.  Office hours are 8:00 a.m. to 4:30 p.m. Monday - Friday. Please note that voicemails left after 4:00 p.m. may not be returned until the following business day.  We are closed weekends and major holidays. You have access to a nurse at all times for urgent questions. Please call the main number to the clinic Dept: [phone] and follow the prompts.   For any non-urgent questions, you may also contact your provider using MyChart. We now offer e-Visits for anyone 18 and older to request care online for non-urgent symptoms. For details visit [email or web address].   Also download the MyChart app! Go to the app store, search "MyChart", open the app, select [facility], and log in with your MyChart username and password.  Masks are optional in the cancer centers. If you would like for your care team to wear a mask while they are taking care of you, please let them know. You may have one support person who is at least 74 years old accompany you for your appointments. 

## 2022-06-17 ENCOUNTER — Encounter: Payer: Self-pay | Admitting: *Deleted

## 2022-06-17 ENCOUNTER — Telehealth: Payer: Self-pay | Admitting: Hematology

## 2022-06-17 NOTE — Telephone Encounter (Signed)
Called patient with new appointment information. Left voicemail with appointment details.. mailing calendar for patient

## 2022-06-18 ENCOUNTER — Other Ambulatory Visit: Payer: Self-pay

## 2022-06-20 ENCOUNTER — Other Ambulatory Visit: Payer: Self-pay

## 2022-06-21 ENCOUNTER — Other Ambulatory Visit: Payer: Self-pay

## 2022-07-07 ENCOUNTER — Inpatient Hospital Stay: Payer: Medicare Other

## 2022-07-07 ENCOUNTER — Inpatient Hospital Stay: Payer: Medicare Other | Attending: Hematology

## 2022-07-07 ENCOUNTER — Other Ambulatory Visit: Payer: Self-pay | Admitting: Hematology

## 2022-07-07 ENCOUNTER — Other Ambulatory Visit: Payer: Self-pay

## 2022-07-07 VITALS — BP 150/70 | HR 62 | Temp 97.8°F | Resp 16 | Wt 111.5 lb

## 2022-07-07 DIAGNOSIS — Z808 Family history of malignant neoplasm of other organs or systems: Secondary | ICD-10-CM | POA: Diagnosis not present

## 2022-07-07 DIAGNOSIS — Z79899 Other long term (current) drug therapy: Secondary | ICD-10-CM | POA: Insufficient documentation

## 2022-07-07 DIAGNOSIS — Z5112 Encounter for antineoplastic immunotherapy: Secondary | ICD-10-CM | POA: Diagnosis not present

## 2022-07-07 DIAGNOSIS — Z809 Family history of malignant neoplasm, unspecified: Secondary | ICD-10-CM | POA: Diagnosis not present

## 2022-07-07 DIAGNOSIS — Z170 Estrogen receptor positive status [ER+]: Secondary | ICD-10-CM

## 2022-07-07 DIAGNOSIS — C50412 Malignant neoplasm of upper-outer quadrant of left female breast: Secondary | ICD-10-CM | POA: Insufficient documentation

## 2022-07-07 DIAGNOSIS — Z8249 Family history of ischemic heart disease and other diseases of the circulatory system: Secondary | ICD-10-CM | POA: Insufficient documentation

## 2022-07-07 DIAGNOSIS — Z95828 Presence of other vascular implants and grafts: Secondary | ICD-10-CM

## 2022-07-07 DIAGNOSIS — Z8349 Family history of other endocrine, nutritional and metabolic diseases: Secondary | ICD-10-CM | POA: Insufficient documentation

## 2022-07-07 LAB — CBC WITH DIFFERENTIAL (CANCER CENTER ONLY)
Abs Immature Granulocytes: 0.02 10*3/uL (ref 0.00–0.07)
Basophils Absolute: 0 10*3/uL (ref 0.0–0.1)
Basophils Relative: 1 %
Eosinophils Absolute: 0.3 10*3/uL (ref 0.0–0.5)
Eosinophils Relative: 4 %
HCT: 38.5 % (ref 36.0–46.0)
Hemoglobin: 13.4 g/dL (ref 12.0–15.0)
Immature Granulocytes: 0 %
Lymphocytes Relative: 13 %
Lymphs Abs: 0.9 10*3/uL (ref 0.7–4.0)
MCH: 32.4 pg (ref 26.0–34.0)
MCHC: 34.8 g/dL (ref 30.0–36.0)
MCV: 93 fL (ref 80.0–100.0)
Monocytes Absolute: 0.7 10*3/uL (ref 0.1–1.0)
Monocytes Relative: 10 %
Neutro Abs: 5.1 10*3/uL (ref 1.7–7.7)
Neutrophils Relative %: 72 %
Platelet Count: 208 10*3/uL (ref 150–400)
RBC: 4.14 MIL/uL (ref 3.87–5.11)
RDW: 12.9 % (ref 11.5–15.5)
WBC Count: 7 10*3/uL (ref 4.0–10.5)
nRBC: 0 % (ref 0.0–0.2)

## 2022-07-07 LAB — CMP (CANCER CENTER ONLY)
ALT: 14 U/L (ref 0–44)
AST: 23 U/L (ref 15–41)
Albumin: 3.9 g/dL (ref 3.5–5.0)
Alkaline Phosphatase: 39 U/L (ref 38–126)
Anion gap: 4 — ABNORMAL LOW (ref 5–15)
BUN: 20 mg/dL (ref 8–23)
CO2: 28 mmol/L (ref 22–32)
Calcium: 9.6 mg/dL (ref 8.9–10.3)
Chloride: 108 mmol/L (ref 98–111)
Creatinine: 0.7 mg/dL (ref 0.44–1.00)
GFR, Estimated: >60 mL/min (ref >60)
Glucose, Bld: 87 mg/dL (ref 70–99)
Potassium: 4.1 mmol/L (ref 3.5–5.1)
Sodium: 140 mmol/L (ref 135–145)
Total Bilirubin: 0.4 mg/dL (ref 0.3–1.2)
Total Protein: 5.7 g/dL — ABNORMAL LOW (ref 6.5–8.1)

## 2022-07-07 MED ORDER — SODIUM CHLORIDE 0.9% FLUSH
10.0000 mL | INTRAVENOUS | Status: DC | PRN
  Administered 2022-07-07: 10 mL

## 2022-07-07 MED ORDER — DIPHENHYDRAMINE HCL 25 MG PO CAPS
25.0000 mg | ORAL_CAPSULE | Freq: Once | ORAL | Status: AC
  Administered 2022-07-07: 25 mg via ORAL
  Filled 2022-07-07: qty 1

## 2022-07-07 MED ORDER — SODIUM CHLORIDE 0.9 % IV SOLN: Freq: Once | INTRAVENOUS | Status: AC

## 2022-07-07 MED ORDER — SODIUM CHLORIDE 0.9% FLUSH
10.0000 mL | Freq: Once | INTRAVENOUS | Status: AC
  Administered 2022-07-07: 10 mL

## 2022-07-07 MED ORDER — HEPARIN SOD (PORK) LOCK FLUSH 100 UNIT/ML IV SOLN
500.0000 [IU] | Freq: Once | INTRAVENOUS | Status: AC | PRN
  Administered 2022-07-07: 500 [IU]

## 2022-07-07 MED ORDER — ACETAMINOPHEN 325 MG PO TABS
650.0000 mg | ORAL_TABLET | Freq: Once | ORAL | Status: AC
  Administered 2022-07-07: 650 mg via ORAL
  Filled 2022-07-07: qty 2

## 2022-07-07 MED ORDER — TRASTUZUMAB-DKST CHEMO 150 MG IV SOLR
6.0000 mg/kg | Freq: Once | INTRAVENOUS | Status: AC
  Administered 2022-07-07: 315 mg via INTRAVENOUS
  Filled 2022-07-07: qty 15

## 2022-07-07 NOTE — Patient Instructions (Signed)
[facility]  Discharge Instructions: Thank you for choosing [facility] to provide your oncology and hematology care.   If you have a lab appointment with the [facility], please go directly to the [facility] and check in at the registration area.   Wear comfortable clothing and clothing appropriate for easy access to any Portacath or PICC line.   We strive to give you quality time with your provider. You may need to reschedule your appointment if you arrive late (15 or more minutes).  Arriving late affects you and other patients whose appointments are after yours.  Also, if you miss three or more appointments without notifying the office, you may be dismissed from the clinic at the provider's discretion.      For prescription refill requests, have your pharmacy contact our office and allow 72 hours for refills to be completed.    Today you received the following chemotherapy and/or immunotherapy agents: trastuzumab-dkst Madalyn Rob)      To help prevent nausea and vomiting after your treatment, we encourage you to take your nausea medication as directed.  BELOW ARE SYMPTOMS THAT SHOULD BE REPORTED IMMEDIATELY: *FEVER GREATER THAN 100.4 F (38 C) OR HIGHER *CHILLS OR SWEATING *NAUSEA AND VOMITING THAT IS NOT CONTROLLED WITH YOUR NAUSEA MEDICATION *UNUSUAL SHORTNESS OF BREATH *UNUSUAL BRUISING OR BLEEDING *URINARY PROBLEMS (pain or burning when urinating, or frequent urination) *BOWEL PROBLEMS (unusual diarrhea, constipation, pain near the anus) TENDERNESS IN MOUTH AND THROAT WITH OR WITHOUT PRESENCE OF ULCERS (sore throat, sores in mouth, or a toothache) UNUSUAL RASH, SWELLING OR PAIN  UNUSUAL VAGINAL DISCHARGE OR ITCHING   Items with * indicate a potential emergency and should be followed up as soon as possible or go to the Emergency Department if any problems should occur.  Please show the CHEMOTHERAPY ALERT CARD or IMMUNOTHERAPY ALERT CARD  at check-in to the Emergency Department and triage nurse.  Should you have questions after your visit or need to cancel or reschedule your appointment, please contact [facility]  Dept: [phone]  and follow the prompts.  Office hours are 8:00 a.m. to 4:30 p.m. Monday - Friday. Please note that voicemails left after 4:00 p.m. may not be returned until the following business day.  We are closed weekends and major holidays. You have access to a nurse at all times for urgent questions. Please call the main number to the clinic Dept: [phone] and follow the prompts.   For any non-urgent questions, you may also contact your provider using MyChart. We now offer e-Visits for anyone 3 and older to request care online for non-urgent symptoms. For details visit [email or web address].   Also download the MyChart app! Go to the app store, search "MyChart", open the app, select [facility], and log in with your MyChart username and password.

## 2022-07-22 ENCOUNTER — Ambulatory Visit (HOSPITAL_COMMUNITY)
Admission: RE | Admit: 2022-07-22 | Discharge: 2022-07-22 | Disposition: A | Discharge status: Home / Self Care | Payer: Medicare Other | Source: Ambulatory Visit | Attending: Nurse Practitioner | Admitting: Nurse Practitioner

## 2022-07-22 DIAGNOSIS — Z170 Estrogen receptor positive status [ER+]: Secondary | ICD-10-CM | POA: Insufficient documentation

## 2022-07-22 DIAGNOSIS — E785 Hyperlipidemia, unspecified: Secondary | ICD-10-CM | POA: Diagnosis not present

## 2022-07-22 DIAGNOSIS — I5020 Unspecified systolic (congestive) heart failure: Secondary | ICD-10-CM | POA: Insufficient documentation

## 2022-07-22 DIAGNOSIS — C50412 Malignant neoplasm of upper-outer quadrant of left female breast: Secondary | ICD-10-CM

## 2022-07-22 DIAGNOSIS — Z0189 Encounter for other specified special examinations: Secondary | ICD-10-CM

## 2022-07-22 LAB — ECHOCARDIOGRAM COMPLETE
Area-P 1/2: 3.08 cm2
Calc EF: 66.5 %
S' Lateral: 2.2 cm
Single Plane A2C EF: 68.8 %
Single Plane A4C EF: 64.5 %

## 2022-07-22 NOTE — Progress Notes (Signed)
Echocardiogram 2D Echocardiogram has been performed.  Katherine Donovan 07/22/2022, 1:32 PM

## 2022-07-28 ENCOUNTER — Inpatient Hospital Stay: Payer: Medicare Other

## 2022-07-28 VITALS — BP 157/78 | HR 62 | Temp 97.6°F | Resp 18 | Wt 113.0 lb

## 2022-07-28 DIAGNOSIS — Z170 Estrogen receptor positive status [ER+]: Secondary | ICD-10-CM

## 2022-07-28 DIAGNOSIS — Z5112 Encounter for antineoplastic immunotherapy: Secondary | ICD-10-CM | POA: Diagnosis not present

## 2022-07-28 DIAGNOSIS — Z95828 Presence of other vascular implants and grafts: Secondary | ICD-10-CM

## 2022-07-28 MED ORDER — SODIUM CHLORIDE 0.9% FLUSH
10.0000 mL | INTRAVENOUS | Status: DC | PRN
  Administered 2022-07-28: 10 mL

## 2022-07-28 MED ORDER — HEPARIN SOD (PORK) LOCK FLUSH 100 UNIT/ML IV SOLN
500.0000 [IU] | Freq: Once | INTRAVENOUS | Status: AC | PRN
  Administered 2022-07-28: 500 [IU]

## 2022-07-28 MED ORDER — HEPARIN SOD (PORK) LOCK FLUSH 100 UNIT/ML IV SOLN: 500.0000 [IU] | Freq: Once | INTRAVENOUS | Status: DC

## 2022-07-28 MED ORDER — DIPHENHYDRAMINE HCL 25 MG PO CAPS
25.0000 mg | ORAL_CAPSULE | Freq: Once | ORAL | Status: AC
  Administered 2022-07-28: 25 mg via ORAL
  Filled 2022-07-28: qty 1

## 2022-07-28 MED ORDER — SODIUM CHLORIDE 0.9% FLUSH
10.0000 mL | Freq: Once | INTRAVENOUS | Status: AC
  Administered 2022-07-28: 10 mL

## 2022-07-28 MED ORDER — SODIUM CHLORIDE 0.9 % IV SOLN: Freq: Once | INTRAVENOUS | Status: AC

## 2022-07-28 MED ORDER — TRASTUZUMAB-DKST CHEMO 150 MG IV SOLR
6.0000 mg/kg | Freq: Once | INTRAVENOUS | Status: AC
  Administered 2022-07-28: 300 mg via INTRAVENOUS
  Filled 2022-07-28: qty 14.29

## 2022-07-28 MED ORDER — ACETAMINOPHEN 325 MG PO TABS
650.0000 mg | ORAL_TABLET | Freq: Once | ORAL | Status: AC
  Administered 2022-07-28: 650 mg via ORAL
  Filled 2022-07-28: qty 2

## 2022-07-28 NOTE — Patient Instructions (Signed)
[facility]  Discharge Instructions: Thank you for choosing [facility] to provide your oncology and hematology care.   If you have a lab appointment with the Cancer Center, please go directly to the Cancer Center and check in at the registration area.   Wear comfortable clothing and clothing appropriate for easy access to any Portacath or PICC line.   We strive to give you quality time with your provider. You may need to reschedule your appointment if you arrive late (15 or more minutes).  Arriving late affects you and other patients whose appointments are after yours.  Also, if you miss three or more appointments without notifying the office, you may be dismissed from the clinic at the provider's discretion.      For prescription refill requests, have your pharmacy contact our office and allow 72 hours for refills to be completed.    Today you received the following chemotherapy and/or immunotherapy agents: Trastuzumab.       To help prevent nausea and vomiting after your treatment, we encourage you to take your nausea medication as directed.  BELOW ARE SYMPTOMS THAT SHOULD BE REPORTED IMMEDIATELY: *FEVER GREATER THAN 100.4 F (38 C) OR HIGHER *CHILLS OR SWEATING *NAUSEA AND VOMITING THAT IS NOT CONTROLLED WITH YOUR NAUSEA MEDICATION *UNUSUAL SHORTNESS OF BREATH *UNUSUAL BRUISING OR BLEEDING *URINARY PROBLEMS (pain or burning when urinating, or frequent urination) *BOWEL PROBLEMS (unusual diarrhea, constipation, pain near the anus) TENDERNESS IN MOUTH AND THROAT WITH OR WITHOUT PRESENCE OF ULCERS (sore throat, sores in mouth, or a toothache) UNUSUAL RASH, SWELLING OR PAIN  UNUSUAL VAGINAL DISCHARGE OR ITCHING   Items with * indicate a potential emergency and should be followed up as soon as possible or go to the Emergency Department if any problems should occur.  Please show the CHEMOTHERAPY ALERT CARD or IMMUNOTHERAPY ALERT CARD at  check-in to the Emergency Department and triage nurse.  Should you have questions after your visit or need to cancel or reschedule your appointment, please contact [facility]  Dept: [phone]  and follow the prompts.  Office hours are 8:00 a.m. to 4:30 p.m. Monday - Friday. Please note that voicemails left after 4:00 p.m. may not be returned until the following business day.  We are closed weekends and major holidays. You have access to a nurse at all times for urgent questions. Please call the main number to the clinic Dept: [phone] and follow the prompts.   For any non-urgent questions, you may also contact your provider using MyChart. We now offer e-Visits for anyone 18 and older to request care online for non-urgent symptoms. For details visit [email or web address].   Also download the MyChart app! Go to the app store, search "MyChart", open the app, select [facility], and log in with your MyChart username and password.   

## 2022-08-16 ENCOUNTER — Ambulatory Visit: Payer: Medicare Other

## 2022-08-16 ENCOUNTER — Ambulatory Visit: Payer: Medicare Other | Attending: General Surgery

## 2022-08-16 VITALS — Wt 112.0 lb

## 2022-08-16 DIAGNOSIS — Z483 Aftercare following surgery for neoplasm: Secondary | ICD-10-CM

## 2022-08-16 NOTE — Therapy (Addendum)
  OUTPATIENT PHYSICAL THERAPY SOZO SCREENING NOTE   Patient Name: Katherine Donovan MRN: [id number] DOB:10-Aug-1945, 77 y.o., female Today's Date: 08/16/2022  PCP: Nickola Major, MD REFERRING PROVIDER: [name], MD   PT End of Session - 08/16/22 [id number]     Visit Number 2   # unchanged due to screen only   PT Start Time 0804    PT Stop Time 0810    PT Time Calculation (min) 6 min    Activity Tolerance Patient tolerated treatment well    Behavior During Therapy [facility] for tasks assessed/performed             Past Medical History:  Diagnosis Date   History of bunionectomy 09/02/2020   Hyperlipidemia    Hyperlipidemia    left breast ILC 09/15/21   Osteoporosis    Rosacea    to face   Past Surgical History:  Procedure Laterality Date   BREAST LUMPECTOMY WITH RADIOACTIVE SEED AND SENTINEL LYMPH NODE BIOPSY Left 02/04/2022   Procedure: LEFT BREAST SEED GUIDED LUMPECTOMY AND LEFT AXILLARY SENTINEL NODE BIOPSY;  Surgeon: [name], MD;  Location: [facility];  Service: General;  Laterality: Left;  LMA & PEC BLOCK   PORTACATH PLACEMENT Right 10/06/2021   Procedure: INSERTION PORT-A-CATH;  Surgeon: [name], MD;  Location: [facility];  Service: General;  Laterality: Right;   [place]     Patient Active Problem List   Diagnosis Date Noted   Encounter for immunization 10/19/2021   Port-A-Cath in place 10/14/2021   Malignant neoplasm of upper-outer quadrant of left breast in female, estrogen receptor positive ([place]) 09/18/2021   Pure hypercholesterolemia 09/18/2015   Age-related osteoporosis with current pathological fracture with routine healing 09/18/2015    REFERRING DIAG: left breast cancer at risk for lymphedema  THERAPY DIAG: Aftercare following surgery for neoplasm  PERTINENT HISTORY: Patient was diagnosed on 08/21/2021 with 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 It  measures 3 cm and is located in the upper outer quadrant. It is ER positive, PR negative and HER2 positive with a Ki67 of 15%. Neoadjuvant chemotherapy TCHP. Left lumpectomy on 02/04/22 with 6 negative lymph nodes. Will be having radiation. Has osteoporosis.     PRECAUTIONS: left UE Lymphedema risk, None  SUBJECTIVE: Pt returns for her first 3 month L-Dex screen.   PAIN:  Are you having pain? No  SOZO SCREENING: Patient was assessed today using the SOZO machine to determine the lymphedema index score. This was compared to her baseline score. It was determined that she is within the recommended range when compared to her baseline and no further action is needed at this time. She will continue SOZO screenings. These are done every 3 months for 2 years post operatively followed by every 6 months for 2 years, and then annually.   L-DEX FLOWSHEETS - 08/16/22 0800       L-DEX LYMPHEDEMA SCREENING   Measurement Type Unilateral    L-DEX MEASUREMENT EXTREMITY Upper Extremity    POSITION  Standing    DOMINANT SIDE Right    At Risk Side Left    BASELINE SCORE (UNILATERAL) 2.6    L-DEX SCORE (UNILATERAL) 5    VALUE CHANGE (UNILAT) 2.4               Otelia Limes, PTA 08/16/2022, 11:01 AM

## 2022-08-17 ENCOUNTER — Other Ambulatory Visit: Payer: Self-pay

## 2022-08-19 ENCOUNTER — Inpatient Hospital Stay: Payer: Medicare Other | Attending: Hematology

## 2022-08-19 ENCOUNTER — Inpatient Hospital Stay: Payer: Medicare Other

## 2022-08-19 VITALS — BP 160/74 | HR 56 | Temp 98.1°F | Resp 16 | Wt 112.8 lb

## 2022-08-19 DIAGNOSIS — Z808 Family history of malignant neoplasm of other organs or systems: Secondary | ICD-10-CM | POA: Insufficient documentation

## 2022-08-19 DIAGNOSIS — Z170 Estrogen receptor positive status [ER+]: Secondary | ICD-10-CM

## 2022-08-19 DIAGNOSIS — Z79899 Other long term (current) drug therapy: Secondary | ICD-10-CM | POA: Insufficient documentation

## 2022-08-19 DIAGNOSIS — Z809 Family history of malignant neoplasm, unspecified: Secondary | ICD-10-CM | POA: Diagnosis not present

## 2022-08-19 DIAGNOSIS — Z95828 Presence of other vascular implants and grafts: Secondary | ICD-10-CM

## 2022-08-19 DIAGNOSIS — C50412 Malignant neoplasm of upper-outer quadrant of left female breast: Secondary | ICD-10-CM | POA: Diagnosis present

## 2022-08-19 DIAGNOSIS — Z8249 Family history of ischemic heart disease and other diseases of the circulatory system: Secondary | ICD-10-CM | POA: Diagnosis not present

## 2022-08-19 DIAGNOSIS — Z5112 Encounter for antineoplastic immunotherapy: Secondary | ICD-10-CM | POA: Insufficient documentation

## 2022-08-19 DIAGNOSIS — Z8349 Family history of other endocrine, nutritional and metabolic diseases: Secondary | ICD-10-CM | POA: Diagnosis not present

## 2022-08-19 LAB — CBC WITH DIFFERENTIAL (CANCER CENTER ONLY)
Abs Immature Granulocytes: 0.03 10*3/uL (ref 0.00–0.07)
Basophils Absolute: 0 10*3/uL (ref 0.0–0.1)
Basophils Relative: 1 %
Eosinophils Absolute: 0.2 10*3/uL (ref 0.0–0.5)
Eosinophils Relative: 3 %
HCT: 39.1 % (ref 36.0–46.0)
Hemoglobin: 13.3 g/dL (ref 12.0–15.0)
Immature Granulocytes: 1 %
Lymphocytes Relative: 17 %
Lymphs Abs: 1.1 10*3/uL (ref 0.7–4.0)
MCH: 31.7 pg (ref 26.0–34.0)
MCHC: 34 g/dL (ref 30.0–36.0)
MCV: 93.1 fL (ref 80.0–100.0)
Monocytes Absolute: 0.6 10*3/uL (ref 0.1–1.0)
Monocytes Relative: 9 %
Neutro Abs: 4.7 10*3/uL (ref 1.7–7.7)
Neutrophils Relative %: 69 %
Platelet Count: 184 10*3/uL (ref 150–400)
RBC: 4.2 MIL/uL (ref 3.87–5.11)
RDW: 12.4 % (ref 11.5–15.5)
WBC Count: 6.7 10*3/uL (ref 4.0–10.5)
nRBC: 0 % (ref 0.0–0.2)

## 2022-08-19 LAB — CMP (CANCER CENTER ONLY)
ALT: 13 U/L (ref 0–44)
AST: 27 U/L (ref 15–41)
Albumin: 3.2 g/dL — ABNORMAL LOW (ref 3.5–5.0)
Alkaline Phosphatase: 36 U/L — ABNORMAL LOW (ref 38–126)
Anion gap: 10 (ref 5–15)
BUN: 18 mg/dL (ref 8–23)
CO2: 26 mmol/L (ref 22–32)
Calcium: 9.8 mg/dL (ref 8.9–10.3)
Chloride: 104 mmol/L (ref 98–111)
Creatinine: 0.76 mg/dL (ref 0.44–1.00)
GFR, Estimated: >60 mL/min (ref >60)
Glucose, Bld: 87 mg/dL (ref 70–99)
Potassium: 4 mmol/L (ref 3.5–5.1)
Sodium: 140 mmol/L (ref 135–145)
Total Bilirubin: 0.4 mg/dL (ref 0.3–1.2)
Total Protein: 5.5 g/dL — ABNORMAL LOW (ref 6.5–8.1)

## 2022-08-19 MED ORDER — HEPARIN SOD (PORK) LOCK FLUSH 100 UNIT/ML IV SOLN
500.0000 [IU] | Freq: Once | INTRAVENOUS | Status: AC | PRN
  Administered 2022-08-19: 500 [IU]

## 2022-08-19 MED ORDER — DIPHENHYDRAMINE HCL 25 MG PO CAPS
25.0000 mg | ORAL_CAPSULE | Freq: Once | ORAL | Status: AC
  Administered 2022-08-19: 25 mg via ORAL
  Filled 2022-08-19: qty 1

## 2022-08-19 MED ORDER — SODIUM CHLORIDE 0.9% FLUSH
10.0000 mL | Freq: Once | INTRAVENOUS | Status: AC
  Administered 2022-08-19: 10 mL

## 2022-08-19 MED ORDER — SODIUM CHLORIDE 0.9 % IV SOLN: Freq: Once | INTRAVENOUS | Status: AC

## 2022-08-19 MED ORDER — TRASTUZUMAB-DKST CHEMO 150 MG IV SOLR
6.0000 mg/kg | Freq: Once | INTRAVENOUS | Status: AC
  Administered 2022-08-19: 300 mg via INTRAVENOUS
  Filled 2022-08-19: qty 14.29

## 2022-08-19 MED ORDER — SODIUM CHLORIDE 0.9% FLUSH
10.0000 mL | INTRAVENOUS | Status: DC | PRN
  Administered 2022-08-19: 10 mL

## 2022-08-19 MED ORDER — ACETAMINOPHEN 325 MG PO TABS
650.0000 mg | ORAL_TABLET | Freq: Once | ORAL | Status: AC
  Administered 2022-08-19: 650 mg via ORAL
  Filled 2022-08-19: qty 2

## 2022-08-19 NOTE — Patient Instructions (Signed)
[facility]  Discharge Instructions: Thank you for choosing [facility] to provide your oncology and hematology care.   If you have a lab appointment with the [facility], please go directly to the [facility] and check in at the registration area.   Wear comfortable clothing and clothing appropriate for easy access to any Portacath or PICC line.   We strive to give you quality time with your provider. You may need to reschedule your appointment if you arrive late (15 or more minutes).  Arriving late affects you and other patients whose appointments are after yours.  Also, if you miss three or more appointments without notifying the office, you may be dismissed from the clinic at the provider's discretion.      For prescription refill requests, have your pharmacy contact our office and allow 72 hours for refills to be completed.    Today you received the following chemotherapy and/or immunotherapy agents: trastuzumab-dkst Madalyn Rob)      To help prevent nausea and vomiting after your treatment, we encourage you to take your nausea medication as directed.  BELOW ARE SYMPTOMS THAT SHOULD BE REPORTED IMMEDIATELY: *FEVER GREATER THAN 100.4 F (38 C) OR HIGHER *CHILLS OR SWEATING *NAUSEA AND VOMITING THAT IS NOT CONTROLLED WITH YOUR NAUSEA MEDICATION *UNUSUAL SHORTNESS OF BREATH *UNUSUAL BRUISING OR BLEEDING *URINARY PROBLEMS (pain or burning when urinating, or frequent urination) *BOWEL PROBLEMS (unusual diarrhea, constipation, pain near the anus) TENDERNESS IN MOUTH AND THROAT WITH OR WITHOUT PRESENCE OF ULCERS (sore throat, sores in mouth, or a toothache) UNUSUAL RASH, SWELLING OR PAIN  UNUSUAL VAGINAL DISCHARGE OR ITCHING   Items with * indicate a potential emergency and should be followed up as soon as possible or go to the Emergency Department if any problems should occur.  Please show the CHEMOTHERAPY ALERT CARD or IMMUNOTHERAPY  ALERT CARD at check-in to the Emergency Department and triage nurse.  Should you have questions after your visit or need to cancel or reschedule your appointment, please contact [facility]  Dept: [phone]  and follow the prompts.  Office hours are 8:00 a.m. to 4:30 p.m. Monday - Friday. Please note that voicemails left after 4:00 p.m. may not be returned until the following business day.  We are closed weekends and major holidays. You have access to a nurse at all times for urgent questions. Please call the main number to the clinic Dept: [phone] and follow the prompts.   For any non-urgent questions, you may also contact your provider using MyChart. We now offer e-Visits for anyone 6 and older to request care online for non-urgent symptoms. For details visit [email or web address].   Also download the MyChart app! Go to the app store, search "MyChart", open the app, select [facility], and log in with your MyChart username and password.

## 2022-09-02 NOTE — Progress Notes (Signed)
The following biosimilar Ontruzant (trastuzumab-dttb) has been selected for use in this patient.  Kennith Center, Pharm.D., CPP 09/02/2022'@1'$ :28 PM

## 2022-09-06 ENCOUNTER — Other Ambulatory Visit: Payer: Self-pay | Admitting: Nurse Practitioner

## 2022-09-06 ENCOUNTER — Ambulatory Visit
Admission: RE | Admit: 2022-09-06 | Discharge: 2022-09-06 | Disposition: A | Discharge status: Home / Self Care | Payer: Medicare Other | Source: Ambulatory Visit | Attending: Family Medicine | Admitting: Family Medicine

## 2022-09-06 ENCOUNTER — Ambulatory Visit
Admission: RE | Admit: 2022-09-06 | Discharge: 2022-09-06 | Disposition: A | Discharge status: Home / Self Care | Payer: Medicare Other | Source: Ambulatory Visit | Attending: Nurse Practitioner | Admitting: Nurse Practitioner

## 2022-09-06 DIAGNOSIS — Z853 Personal history of malignant neoplasm of breast: Secondary | ICD-10-CM

## 2022-09-06 DIAGNOSIS — Z1231 Encounter for screening mammogram for malignant neoplasm of breast: Secondary | ICD-10-CM

## 2022-09-06 DIAGNOSIS — M8000XD Age-related osteoporosis with current pathological fracture, unspecified site, subsequent encounter for fracture with routine healing: Secondary | ICD-10-CM

## 2022-09-06 HISTORY — DX: Personal history of antineoplastic chemotherapy: Z92.21

## 2022-09-06 HISTORY — DX: Personal history of irradiation: Z92.3

## 2022-09-07 ENCOUNTER — Telehealth: Payer: Self-pay | Admitting: *Deleted

## 2022-09-07 NOTE — Telephone Encounter (Signed)
Appt review

## 2022-09-08 ENCOUNTER — Other Ambulatory Visit: Payer: Self-pay | Admitting: Hematology

## 2022-09-08 ENCOUNTER — Other Ambulatory Visit: Payer: Self-pay | Admitting: Hematology and Oncology

## 2022-09-08 DIAGNOSIS — Z170 Estrogen receptor positive status [ER+]: Secondary | ICD-10-CM

## 2022-09-08 NOTE — Assessment & Plan Note (Signed)
ILC, Stage IA, c(T1c, N0), ER+/PR-/HER2+, Grade 3  -found on screening mammogram. B/l MM and [place] on 09/01/21 showed a 2.9 cm left breast mass. Biopsy on 09/14/21 showed invasive lobular carcinoma, grade 2-3. -she completed 5 cycles neoadjuvant TCHP 10/07/21 - 12/30/21. Tolerated poorly overall with neuropathy, diarrhea, and low appetite. -left lumpectomy on 02/04/22 by Dr. Donne Hazel showed complete pathological response, no residual carcinoma. -she moved to maintenance trastuzumab on 02/10/22, plan to complete a year in end of 09/2022. She is tolerating well overall. -she has completed adjuvant RT -she is on Tamoxifen now

## 2022-09-08 NOTE — Progress Notes (Signed)
ON PATHWAY REGIMEN - Breast  No Change  Continue With Treatment as Ordered.  Original Decision Date/Time: 02/10/2022 22:08     A cycle is every 21 days:     Trastuzumab-xxxx   **Always confirm dose/schedule in your pharmacy ordering system**  Patient Characteristics: Post-Neoadjuvant Therapy and Resection, HER2 Positive, ER Positive, No Residual Disease, Adjuvant Targeted Therapy After Neoadjuvant Chemo/Targeted Therapy Therapeutic Status: Post-Neoadjuvant Therapy and Resection Residual Invasive Disease Post-Neoadjuvant Therapy<= No ER Status: Positive (+) HER2 Status: Positive (+) PR Status: Negative (-) Intent of Therapy: Curative Intent, Discussed with Patient

## 2022-09-08 NOTE — Assessment & Plan Note (Addendum)
-  Her previous DEXA on 05/14/20 showing T-score of -3.6. repeated DEXA on 09/06/22 showed stable t-score -3.6 in right hip, I reviewed with her  -she reports being on Fosamax consistently for past 10 years  -she is on tamoxifen now which may improve her bone density

## 2022-09-08 NOTE — Progress Notes (Unsigned)
[facility]   [phone] [phone]   Clinic Follow up Note   Patient Care Team: Nickola Major, MD as PCP - General (Family Medicine) Mauro Kaufmann, RN as Oncology Nurse Navigator Rockwell Germany, RN as Oncology Nurse Navigator [name], MD as Consulting Physician (General Surgery) [name], MD as Consulting Physician (Hematology) Kyung Rudd, MD as Consulting Physician (Radiation Oncology) Alla Feeling, NP as Nurse Practitioner (Nurse Practitioner)  Date of Service:  09/09/2022  CHIEF COMPLAINT: f/u of breast cancer.   CURRENT THERAPY: Trastuzumab IV    ASSESSMENT:  Katherine Donovan is a 77 y.o. female with   Malignant neoplasm of upper-outer quadrant of left breast in female, estrogen receptor positive ([place]) ILC, Stage IA, c(T1c, N0), ER+/PR-/HER2+, Grade 3  -found on screening mammogram. B/l MM and [place] on 09/01/21 showed a 2.9 cm left breast mass. Biopsy on 09/14/21 showed invasive lobular carcinoma, grade 2-3. -she completed 5 cycles neoadjuvant TCHP 10/07/21 - 12/30/21. Tolerated poorly overall with neuropathy, diarrhea, and low appetite. -left lumpectomy on 02/04/22 by Dr. Donne Hazel showed complete pathological response, no residual carcinoma. -she moved to maintenance trastuzumab on 02/10/22, plan to complete a year in end of 09/2022. She is tolerating well overall. -she has completed adjuvant RT -she is on Tamoxifen now, she is tolerating very well without significant side effects -Given node-negative disease, no significant benefit of adjuvant neratinib, and I do not recommend it.  Age-related osteoporosis with current pathological fracture with routine healing -Her previous DEXA on 05/14/20 showing T-score of -3.6. repeated DEXA on 09/06/22 showed stable t-score -3.6 in right hip, I reviewed with her  -she reports being on Fosamax consistently for past 10 years, okay to come off now. -she is on tamoxifen now which may improve  her bone density      PLAN: -I reviewed her recent DEXA scan results, and recommend her to discontinue Flosamax -lab reviewed -proceed with Herceptin today; last dose 3/28 -f/u in 3 months -She will call her surgeon and to have her port removed in the next few months.  SUMMARY OF ONCOLOGIC HISTORY: Oncology History Overview Note   Cancer Staging  Malignant neoplasm of upper-outer quadrant of left breast in female, estrogen receptor positive ([place]) Staging form: Breast, AJCC 8th Edition - Clinical stage from 09/14/2021: Stage IA (cT1c, cN0, cM0, G3, ER+, PR-, HER2+) - Signed by [name], MD on 09/22/2021    Malignant neoplasm of upper-outer quadrant of left breast in female, estrogen receptor positive ([place])  09/01/2021 Mammogram   CLINICAL DATA:  Bilateral screening recall for a right breast asymmetry and left breast mass.   EXAM: DIGITAL DIAGNOSTIC BILATERAL MAMMOGRAM WITH TOMOSYNTHESIS AND CAD; ULTRASOUND LEFT BREAST LIMITED; ULTRASOUND RIGHT BREAST LIMITED  IMPRESSION: 1. There is a highly suspicious ill-defined mass in the upper-outer left breast. The mass measures at least 2.9 cm, and possibly up to 3.8 cm. Accurate measurement is difficult due to its ill-defined insinuating appearance.   2.  No evidence of left axillary lymphadenopathy.   3. No persistent suspicious mammographic or targeted sonographic abnormalities in the right breast.   09/14/2021 Cancer Staging   Staging form: Breast, AJCC 8th Edition - Clinical stage from 09/14/2021: Stage IA (cT1c, cN0, cM0, G3, ER+, PR-, HER2+) - Signed by [name], MD on 09/22/2021 Stage prefix: Initial diagnosis Histologic grading system: 3 grade system   09/14/2021 Initial Biopsy   Diagnosis Breast, left, needle core biopsy, 1 o'clock, 4cmfn - INVASIVE MAMMARY CARCINOMA.  SEE NOTE Diagnosis Note Carcinoma measures 1.3 cm in greatest linear dimension and appears grade 2-3.  Immunohistochemical stain for E-cadherin is  negative, consistent with a lobular phenotype.  PROGNOSTIC INDICATORS Results: The tumor cells are POSITIVE for Her2 (3+). Estrogen Receptor: 50%, POSITIVE, MODERATE-WEAK STAINING INTENSITY Progesterone Receptor: 0%, NEGATIVE Proliferation Marker Ki67: 15%   09/18/2021 Initial Diagnosis   Malignant neoplasm of upper-outer quadrant of left breast in female, estrogen receptor positive ([place])   09/21/2021 Imaging   EXAM: BILATERAL BREAST MRI WITH AND WITHOUT CONTRAST  IMPRESSION: 1. 3 cm biopsy-proven malignancy within the UPPER-OUTER LEFT breast. No evidence of multifocal, multicentric or contralateral malignancy. No abnormal lymph nodes.   10/07/2021 - 02/10/2022 Chemotherapy   Patient is on Treatment Plan : BREAST  Docetaxel + Carboplatin + Trastuzumab + Pertuzumab  (TCHP) q21d      10/16/2021 Genetic Testing   Negative genetic testing: no pathogenic variants detected in [facility] CancerNext-Expanded +RNAinsight Panel.  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 Report date is October 16, 2021.   The CancerNext-Expanded gene panel offered by [facility] and includes sequencing, rearrangement, and RNA analysis for the following 77 genes: AIP, ALK, APC, ATM, AXIN2, BAP1, BARD1, BLM, BMPR1A, BRCA1, BRCA2, BRIP1, CDC73, CDH1, CDK4, CDKN1B, CDKN2A, CHEK2, CTNNA1, DICER1, FANCC, FH, FLCN, GALNT12, KIF1B, LZTR1, MAX, MEN1, MET, MLH1, MSH2, MSH3, MSH6, MUTYH, NBN, NF1, NF2, NTHL1, PALB2, PHOX2B, PMS2, POT1, PRKAR1A, PTCH1, PTEN, RAD51C, RAD51D, RB1, RECQL, RET, SDHA, SDHAF2, SDHB, SDHC, SDHD, SMAD4, SMARCA4, SMARCB1, SMARCE1, STK11, SUFU, TMEM127, TP53, TSC1, TSC2, VHL and XRCC2 (sequencing and deletion/duplication); EGFR, EGLN1, HOXB13, KIT, MITF, PDGFRA, POLD1, and POLE (sequencing only); EPCAM and GREM1 (deletion/duplication only).   02/04/2022 Definitive Surgery   Lumpectomy by Dr. Georgiann Mccoy. BREAST, LEFT, LUMPECTOMY:  - Hyaline fibrosis and focal calcifications with no  evidence of residual carcinoma - complete therapeutic response  - Mild fibrocystic change  - See comment  B. LYMPH NODE, LEFT AXILLA, SENTINEL, EXCISION:  - Lymph node, negative for carcinoma (0/1)  C. LYMPH NODE, LEFT AXILLA, SENTINEL, EXCISION:  - Lymph node, negative for carcinoma (0/1)  D. LYMPH NODE, LEFT AXILLA, SENTINEL, EXCISION:  - Lymph node, negative for carcinoma (0/1)  E. LYMPH NODE, LEFT AXILLA, SENTINEL, EXCISION:  - Lymph node, negative for carcinoma (0/1)  F. LYMPH NODE, LEFT AXILLA, SENTINEL, EXCISION:  - Lymph node, negative for carcinoma (0/1)  G. LYMPH NODE, LEFT AXILLA, SENTINEL, EXCISION:  - Lymph node, negative for carcinoma (0/1)  H. BREAST, LEFT INFERIOR MARGIN, EXCISION:  - Hyaline fibrosis with no evidence of residual carcinoma  - Mild fibrocystic change with calcification   COMMENT:  The appropriate pathologic stage is ypT0 ypN0    02/04/2022 Cancer Staging   Staging form: Breast, AJCC 8th Edition - Pathologic stage from 02/04/2022: No Stage Recommended (ypT0, pN0(sn), cM0) - Signed by Alla Feeling, NP on 02/10/2022 Stage prefix: Post-therapy Response to neoadjuvant therapy: Complete response Method of lymph node assessment: Sentinel lymph node biopsy   03/03/2022 - 08/19/2022 Chemotherapy   Patient is on Treatment Plan : BREAST Trastuzumab IV (8/6) or SQ (600) D1 q21d     03/23/2022 - 04/19/2022 Radiation Therapy   Intent: Curative Radiation Treatment Dates: 03/23/2022 through 04/19/2022 Site Technique Total Dose (Gy) Dose per Fx (Gy) Completed Fx Beam Energies  Breast, Left: Breast_L 3D 42.56/42.56 2.66 16/16 6XFFF  Breast, Left: Breast_L_Bst 3D 8/8 2 4/4 6X      04/2022 -  Anti-estrogen oral therapy  Began adjuvant Tamoxifen   06/16/2022 Survivorship   SCP delivered by Cira Rue, NP   09/09/2022 -  Chemotherapy   Patient is on Treatment Plan : BREAST MAINTENANCE Trastuzumab IV (6) or SQ (600) D1 q21d X 11 Cycles        INTERVAL HISTORY:   Katherine Donovan is here for a follow up of breast cancer.  She was last seen by NP Lacie on 06/17/2023 She presents to the clinic alone. Pt state she is doing well on Tamoxifen some warm days but not hot flashes and also some clear discharge.Pt state she had some numbness and tingling in her hands and some joint pain.      All other systems were reviewed with the patient and are negative.  MEDICAL HISTORY:  Past Medical History:  Diagnosis Date   History of bunionectomy 09/02/2020   Hyperlipidemia    Hyperlipidemia    left breast ILC 09/15/21   Osteoporosis    Personal history of chemotherapy    Personal history of radiation therapy    Rosacea    to face    SURGICAL HISTORY: Past Surgical History:  Procedure Laterality Date   BREAST LUMPECTOMY Left 2023   BREAST LUMPECTOMY WITH RADIOACTIVE SEED AND SENTINEL LYMPH NODE BIOPSY Left 02/04/2022   Procedure: LEFT BREAST SEED GUIDED LUMPECTOMY AND LEFT AXILLARY SENTINEL NODE BIOPSY;  Surgeon: [name], MD;  Location: [facility];  Service: General;  Laterality: Left;  LMA & PEC BLOCK   PORTACATH PLACEMENT Right 10/06/2021   Procedure: INSERTION PORT-A-CATH;  Surgeon: [name], MD;  Location: [facility];  Service: General;  Laterality: Right;   SIGMOIDOSCOPY  07/05/1997   TUBAL LIGATION      I have reviewed the social history and family history with the patient and they are unchanged from previous note.  ALLERGIES:  has No Known Allergies.  MEDICATIONS:  Current Outpatient Medications  Medication Sig Dispense Refill   Calcium Carbonate-Vitamin D 600-400 MG-UNIT tablet Take 1 tablet by mouth daily.       CRESTOR 10 MG tablet Take 5 mg by mouth Daily.     Glucosamine 750 MG TABS Take 750 mg by mouth daily.       Lactobacillus Rhamnosus, GG, (RA PROBIOTIC DIGESTIVE CARE) CAPS Take 1 capsule by mouth 3 (three) times a week.     Misc Natural Products (JOINT SUPPORT COMPLEX PO) Take  by mouth daily. [place] Joints-Cartilage-Bone Triple Action Joint Health; Undenatured Type II Collagen     Multiple Vitamin (MULTI-VITAMIN) tablet Take 1 tablet by mouth daily.     tamoxifen (NOLVADEX) 20 MG tablet Take 1 tablet (20 mg total) by mouth daily. 90 tablet 3   No current facility-administered medications for this visit.   Facility-Administered Medications Ordered in Other Visits  Medication Dose Route Frequency Provider Last Rate Last Admin   sodium chloride flush (NS) 0.9 % injection 10 mL  10 mL Intracatheter PRN [name], MD   10 mL at 09/09/22 1019    PHYSICAL EXAMINATION: ECOG PERFORMANCE STATUS: 0 - Asymptomatic  Vitals:   09/09/22 0825  BP: (!) 166/74  Pulse: 65  Resp: 18  Temp: 98.2 F (36.8 C)  SpO2: 100%   Wt Readings from Last 3 Encounters:  09/09/22 111 lb 12.8 oz (50.7 kg)  08/19/22 112 lb 12 oz (51.1 kg)  08/16/22 112 lb (50.8 kg)     GENERAL:alert, no distress and comfortable SKIN: skin color normal, no rashes or significant  lesions EYES: normal, Conjunctiva are pink and non-injected, sclera clear  NEURO: alert & oriented x 3 with fluent speech  LABORATORY DATA:  I have reviewed the data as listed    Latest Ref Rng & Units 09/09/2022    8:05 AM 08/19/2022    8:11 AM 07/07/2022    7:52 AM  CBC  WBC 4.0 - 10.5 K/uL 5.7  6.7  7.0   Hemoglobin 12.0 - 15.0 g/dL 13.3  13.3  13.4   Hematocrit 36.0 - 46.0 % 39.1  39.1  38.5   Platelets 150 - 400 K/uL 185  184  208         Latest Ref Rng & Units 09/09/2022    8:05 AM 08/19/2022    8:11 AM 07/07/2022    7:52 AM  CMP  Glucose 70 - 99 mg/dL 85  87  87   BUN 8 - 23 mg/dL '17  18  20   '$ Creatinine 0.44 - 1.00 mg/dL 0.75  0.76  0.70   Sodium 135 - 145 mmol/L 141  140  140   Potassium 3.5 - 5.1 mmol/L 4.2  4.0  4.1   Chloride 98 - 111 mmol/L 107  104  108   CO2 22 - 32 mmol/L '27  26  28   '$ Calcium 8.9 - 10.3 mg/dL 9.6  9.8  9.6   Total Protein 6.5 - 8.1 g/dL 6.1  5.5  5.7   Total Bilirubin 0.3 - 1.2  mg/dL 0.6  0.4  0.4   Alkaline Phos 38 - 126 U/L 32  36  39   AST 15 - 41 U/L '25  27  23   '$ ALT 0 - 44 U/L '15  13  14       '$ RADIOGRAPHIC STUDIES: I have personally reviewed the radiological images as listed and agreed with the findings in the report. No results found.    Orders Placed This Encounter  Procedures   ECHOCARDIOGRAM COMPLETE    Standing Status:   Future    Standing Expiration Date:   09/09/2023    Order Specific Question:   Where should this test be performed    Answer:   [facility]    Order Specific Question:   Perflutren DEFINITY (image enhancing agent) should be administered unless hypersensitivity or allergy exist    Answer:   Administer Perflutren    Order Specific Question:   Is a special reader required? (athlete or structural heart)    Answer:   No    Order Specific Question:   Does this study need to be read by the Structural team/Level 3 readers?    Answer:   No    Order Specific Question:   Reason for exam-Echo    Answer:   Chemo  Z09   All questions were answered. The patient knows to call the clinic with any problems, questions or concerns. No barriers to learning was detected. The total time spent in the appointment was 30 minutes.     [name], MD 09/09/2022   Felicity Coyer, CMA, am acting as scribe for [name], MD.   I have reviewed the above documentation for accuracy and completeness, and I agree with the above.

## 2022-09-09 ENCOUNTER — Inpatient Hospital Stay: Payer: Medicare Other

## 2022-09-09 ENCOUNTER — Inpatient Hospital Stay: Payer: Medicare Other | Attending: Hematology | Admitting: Hematology

## 2022-09-09 ENCOUNTER — Encounter: Payer: Self-pay | Admitting: Hematology

## 2022-09-09 VITALS — BP 150/70 | HR 74 | Resp 17

## 2022-09-09 VITALS — BP 166/74 | HR 65 | Temp 98.2°F | Resp 18 | Ht 61.0 in | Wt 111.8 lb

## 2022-09-09 DIAGNOSIS — Z170 Estrogen receptor positive status [ER+]: Secondary | ICD-10-CM | POA: Diagnosis not present

## 2022-09-09 DIAGNOSIS — M8000XD Age-related osteoporosis with current pathological fracture, unspecified site, subsequent encounter for fracture with routine healing: Secondary | ICD-10-CM | POA: Diagnosis not present

## 2022-09-09 DIAGNOSIS — Z9221 Personal history of antineoplastic chemotherapy: Secondary | ICD-10-CM | POA: Diagnosis not present

## 2022-09-09 DIAGNOSIS — N6311 Unspecified lump in the right breast, upper outer quadrant: Secondary | ICD-10-CM | POA: Diagnosis not present

## 2022-09-09 DIAGNOSIS — N6489 Other specified disorders of breast: Secondary | ICD-10-CM | POA: Insufficient documentation

## 2022-09-09 DIAGNOSIS — G629 Polyneuropathy, unspecified: Secondary | ICD-10-CM | POA: Diagnosis not present

## 2022-09-09 DIAGNOSIS — C50412 Malignant neoplasm of upper-outer quadrant of left female breast: Secondary | ICD-10-CM | POA: Insufficient documentation

## 2022-09-09 DIAGNOSIS — M80051A Age-related osteoporosis with current pathological fracture, right femur, initial encounter for fracture: Secondary | ICD-10-CM | POA: Diagnosis not present

## 2022-09-09 DIAGNOSIS — M2550 Pain in unspecified joint: Secondary | ICD-10-CM | POA: Diagnosis not present

## 2022-09-09 DIAGNOSIS — Z5112 Encounter for antineoplastic immunotherapy: Secondary | ICD-10-CM | POA: Insufficient documentation

## 2022-09-09 DIAGNOSIS — Z79899 Other long term (current) drug therapy: Secondary | ICD-10-CM | POA: Insufficient documentation

## 2022-09-09 DIAGNOSIS — Z95828 Presence of other vascular implants and grafts: Secondary | ICD-10-CM

## 2022-09-09 DIAGNOSIS — Z9851 Tubal ligation status: Secondary | ICD-10-CM | POA: Insufficient documentation

## 2022-09-09 DIAGNOSIS — I10 Essential (primary) hypertension: Secondary | ICD-10-CM | POA: Diagnosis not present

## 2022-09-09 DIAGNOSIS — Z923 Personal history of irradiation: Secondary | ICD-10-CM | POA: Diagnosis not present

## 2022-09-09 DIAGNOSIS — Z79810 Long term (current) use of selective estrogen receptor modulators (SERMs): Secondary | ICD-10-CM | POA: Diagnosis not present

## 2022-09-09 LAB — CMP (CANCER CENTER ONLY)
ALT: 15 U/L (ref 0–44)
AST: 25 U/L (ref 15–41)
Albumin: 3.9 g/dL (ref 3.5–5.0)
Alkaline Phosphatase: 32 U/L — ABNORMAL LOW (ref 38–126)
Anion gap: 7 (ref 5–15)
BUN: 17 mg/dL (ref 8–23)
CO2: 27 mmol/L (ref 22–32)
Calcium: 9.6 mg/dL (ref 8.9–10.3)
Chloride: 107 mmol/L (ref 98–111)
Creatinine: 0.75 mg/dL (ref 0.44–1.00)
GFR, Estimated: >60 mL/min (ref >60)
Glucose, Bld: 85 mg/dL (ref 70–99)
Potassium: 4.2 mmol/L (ref 3.5–5.1)
Sodium: 141 mmol/L (ref 135–145)
Total Bilirubin: 0.6 mg/dL (ref 0.3–1.2)
Total Protein: 6.1 g/dL — ABNORMAL LOW (ref 6.5–8.1)

## 2022-09-09 LAB — CBC WITH DIFFERENTIAL (CANCER CENTER ONLY)
Abs Immature Granulocytes: 0.02 10*3/uL (ref 0.00–0.07)
Basophils Absolute: 0 10*3/uL (ref 0.0–0.1)
Basophils Relative: 1 %
Eosinophils Absolute: 0.2 10*3/uL (ref 0.0–0.5)
Eosinophils Relative: 4 %
HCT: 39.1 % (ref 36.0–46.0)
Hemoglobin: 13.3 g/dL (ref 12.0–15.0)
Immature Granulocytes: 0 %
Lymphocytes Relative: 18 %
Lymphs Abs: 1.1 10*3/uL (ref 0.7–4.0)
MCH: 31.7 pg (ref 26.0–34.0)
MCHC: 34 g/dL (ref 30.0–36.0)
MCV: 93.3 fL (ref 80.0–100.0)
Monocytes Absolute: 0.5 10*3/uL (ref 0.1–1.0)
Monocytes Relative: 9 %
Neutro Abs: 3.9 10*3/uL (ref 1.7–7.7)
Neutrophils Relative %: 68 %
Platelet Count: 185 10*3/uL (ref 150–400)
RBC: 4.19 MIL/uL (ref 3.87–5.11)
RDW: 12.2 % (ref 11.5–15.5)
WBC Count: 5.7 10*3/uL (ref 4.0–10.5)
nRBC: 0 % (ref 0.0–0.2)

## 2022-09-09 MED ORDER — SODIUM CHLORIDE 0.9% FLUSH
10.0000 mL | INTRAVENOUS | Status: DC | PRN
  Administered 2022-09-09: 10 mL

## 2022-09-09 MED ORDER — SODIUM CHLORIDE 0.9% FLUSH
10.0000 mL | Freq: Once | INTRAVENOUS | Status: AC
  Administered 2022-09-09: 10 mL

## 2022-09-09 MED ORDER — TRASTUZUMAB-DTTB CHEMO 150 MG IV SOLR
6.0000 mg/kg | Freq: Once | INTRAVENOUS | Status: AC
  Administered 2022-09-09: 300 mg via INTRAVENOUS
  Filled 2022-09-09: qty 14.29

## 2022-09-09 MED ORDER — ACETAMINOPHEN 325 MG PO TABS
650.0000 mg | ORAL_TABLET | Freq: Once | ORAL | Status: AC
  Administered 2022-09-09: 650 mg via ORAL
  Filled 2022-09-09: qty 2

## 2022-09-09 MED ORDER — SODIUM CHLORIDE 0.9 % IV SOLN: Freq: Once | INTRAVENOUS | Status: AC

## 2022-09-09 MED ORDER — HEPARIN SOD (PORK) LOCK FLUSH 100 UNIT/ML IV SOLN
500.0000 [IU] | Freq: Once | INTRAVENOUS | Status: AC | PRN
  Administered 2022-09-09: 500 [IU]

## 2022-09-09 MED ORDER — DIPHENHYDRAMINE HCL 25 MG PO CAPS
25.0000 mg | ORAL_CAPSULE | Freq: Once | ORAL | Status: AC
  Administered 2022-09-09: 25 mg via ORAL
  Filled 2022-09-09: qty 1

## 2022-09-09 NOTE — Patient Instructions (Signed)
[facility]  Discharge Instructions: Thank you for choosing [facility] to provide your oncology and hematology care.   If you have a lab appointment with the [facility], please go directly to the [facility] and check in at the registration area.   Wear comfortable clothing and clothing appropriate for easy access to any Portacath or PICC line.   We strive to give you quality time with your provider. You may need to reschedule your appointment if you arrive late (15 or more minutes).  Arriving late affects you and other patients whose appointments are after yours.  Also, if you miss three or more appointments without notifying the office, you may be dismissed from the clinic at the provider's discretion.      For prescription refill requests, have your pharmacy contact our office and allow 72 hours for refills to be completed.    Today you received the following chemotherapy and/or immunotherapy agents : Trastuzumab / Ontruzant      To help prevent nausea and vomiting after your treatment, we encourage you to take your nausea medication as directed.  BELOW ARE SYMPTOMS THAT SHOULD BE REPORTED IMMEDIATELY: *FEVER GREATER THAN 100.4 F (38 C) OR HIGHER *CHILLS OR SWEATING *NAUSEA AND VOMITING THAT IS NOT CONTROLLED WITH YOUR NAUSEA MEDICATION *UNUSUAL SHORTNESS OF BREATH *UNUSUAL BRUISING OR BLEEDING *URINARY PROBLEMS (pain or burning when urinating, or frequent urination) *BOWEL PROBLEMS (unusual diarrhea, constipation, pain near the anus) TENDERNESS IN MOUTH AND THROAT WITH OR WITHOUT PRESENCE OF ULCERS (sore throat, sores in mouth, or a toothache) UNUSUAL RASH, SWELLING OR PAIN  UNUSUAL VAGINAL DISCHARGE OR ITCHING   Items with * indicate a potential emergency and should be followed up as soon as possible or go to the Emergency Department if any problems should occur.  Please show the CHEMOTHERAPY ALERT CARD or IMMUNOTHERAPY  ALERT CARD at check-in to the Emergency Department and triage nurse.  Should you have questions after your visit or need to cancel or reschedule your appointment, please contact [facility]  Dept: [phone]  and follow the prompts.  Office hours are 8:00 a.m. to 4:30 p.m. Monday - Friday. Please note that voicemails left after 4:00 p.m. may not be returned until the following business day.  We are closed weekends and major holidays. You have access to a nurse at all times for urgent questions. Please call the main number to the clinic Dept: [phone] and follow the prompts.   For any non-urgent questions, you may also contact your provider using MyChart. We now offer e-Visits for anyone 16 and older to request care online for non-urgent symptoms. For details visit [email or web address].   Also download the MyChart app! Go to the app store, search "MyChart", open the app, select [facility], and log in with your MyChart username and password.

## 2022-09-23 ENCOUNTER — Encounter: Payer: Self-pay | Admitting: *Deleted

## 2022-09-30 ENCOUNTER — Inpatient Hospital Stay: Payer: Medicare Other

## 2022-09-30 VITALS — BP 170/92 | HR 66 | Temp 98.0°F | Resp 16 | Wt 110.2 lb

## 2022-09-30 DIAGNOSIS — Z170 Estrogen receptor positive status [ER+]: Secondary | ICD-10-CM

## 2022-09-30 DIAGNOSIS — Z95828 Presence of other vascular implants and grafts: Secondary | ICD-10-CM

## 2022-09-30 DIAGNOSIS — Z5112 Encounter for antineoplastic immunotherapy: Secondary | ICD-10-CM | POA: Diagnosis not present

## 2022-09-30 LAB — CMP (CANCER CENTER ONLY)
ALT: 17 U/L (ref 0–44)
AST: 26 U/L (ref 15–41)
Albumin: 4 g/dL (ref 3.5–5.0)
Alkaline Phosphatase: 30 U/L — ABNORMAL LOW (ref 38–126)
Anion gap: 6 (ref 5–15)
BUN: 18 mg/dL (ref 8–23)
CO2: 27 mmol/L (ref 22–32)
Calcium: 9.8 mg/dL (ref 8.9–10.3)
Chloride: 107 mmol/L (ref 98–111)
Creatinine: 0.82 mg/dL (ref 0.44–1.00)
GFR, Estimated: >60 mL/min (ref >60)
Glucose, Bld: 85 mg/dL (ref 70–99)
Potassium: 4.4 mmol/L (ref 3.5–5.1)
Sodium: 140 mmol/L (ref 135–145)
Total Bilirubin: 0.5 mg/dL (ref 0.3–1.2)
Total Protein: 6.3 g/dL — ABNORMAL LOW (ref 6.5–8.1)

## 2022-09-30 LAB — CBC WITH DIFFERENTIAL (CANCER CENTER ONLY)
Abs Immature Granulocytes: 0.01 10*3/uL (ref 0.00–0.07)
Basophils Absolute: 0 10*3/uL (ref 0.0–0.1)
Basophils Relative: 1 %
Eosinophils Absolute: 0.1 10*3/uL (ref 0.0–0.5)
Eosinophils Relative: 2 %
HCT: 42.4 % (ref 36.0–46.0)
Hemoglobin: 14.1 g/dL (ref 12.0–15.0)
Immature Granulocytes: 0 %
Lymphocytes Relative: 14 %
Lymphs Abs: 0.9 10*3/uL (ref 0.7–4.0)
MCH: 30.9 pg (ref 26.0–34.0)
MCHC: 33.3 g/dL (ref 30.0–36.0)
MCV: 92.8 fL (ref 80.0–100.0)
Monocytes Absolute: 0.6 10*3/uL (ref 0.1–1.0)
Monocytes Relative: 9 %
Neutro Abs: 4.7 10*3/uL (ref 1.7–7.7)
Neutrophils Relative %: 74 %
Platelet Count: 208 10*3/uL (ref 150–400)
RBC: 4.57 MIL/uL (ref 3.87–5.11)
RDW: 12.2 % (ref 11.5–15.5)
WBC Count: 6.4 10*3/uL (ref 4.0–10.5)
nRBC: 0 % (ref 0.0–0.2)

## 2022-09-30 MED ORDER — SODIUM CHLORIDE 0.9% FLUSH
10.0000 mL | INTRAVENOUS | Status: DC | PRN
  Administered 2022-09-30: 10 mL

## 2022-09-30 MED ORDER — SODIUM CHLORIDE 0.9 % IV SOLN: Freq: Once | INTRAVENOUS | Status: AC

## 2022-09-30 MED ORDER — HEPARIN SOD (PORK) LOCK FLUSH 100 UNIT/ML IV SOLN
500.0000 [IU] | Freq: Once | INTRAVENOUS | Status: AC | PRN
  Administered 2022-09-30: 500 [IU]

## 2022-09-30 MED ORDER — SODIUM CHLORIDE 0.9% FLUSH
10.0000 mL | Freq: Once | INTRAVENOUS | Status: AC
  Administered 2022-09-30: 10 mL

## 2022-09-30 MED ORDER — ACETAMINOPHEN 325 MG PO TABS
650.0000 mg | ORAL_TABLET | Freq: Once | ORAL | Status: AC
  Administered 2022-09-30: 650 mg via ORAL
  Filled 2022-09-30: qty 2

## 2022-09-30 MED ORDER — DIPHENHYDRAMINE HCL 25 MG PO CAPS
25.0000 mg | ORAL_CAPSULE | Freq: Once | ORAL | Status: AC
  Administered 2022-09-30: 25 mg via ORAL
  Filled 2022-09-30: qty 1

## 2022-09-30 MED ORDER — TRASTUZUMAB-DTTB CHEMO 150 MG IV SOLR
6.0000 mg/kg | Freq: Once | INTRAVENOUS | Status: AC
  Administered 2022-09-30: 300 mg via INTRAVENOUS
  Filled 2022-09-30: qty 14.29

## 2022-09-30 NOTE — Patient Instructions (Signed)
[facility]  Discharge Instructions: Thank you for choosing [facility] to provide your oncology and hematology care.   If you have a lab appointment with the Cancer Center, please go directly to the Cancer Center and check in at the registration area.   Wear comfortable clothing and clothing appropriate for easy access to any Portacath or PICC line.   We strive to give you quality time with your provider. You may need to reschedule your appointment if you arrive late (15 or more minutes).  Arriving late affects you and other patients whose appointments are after yours.  Also, if you miss three or more appointments without notifying the office, you may be dismissed from the clinic at the provider's discretion.      For prescription refill requests, have your pharmacy contact our office and allow 72 hours for refills to be completed.    Today you received the following chemotherapy and/or immunotherapy agents : Trastuzumab / Ontruzant      To help prevent nausea and vomiting after your treatment, we encourage you to take your nausea medication as directed.  BELOW ARE SYMPTOMS THAT SHOULD BE REPORTED IMMEDIATELY: *FEVER GREATER THAN 100.4 F (38 C) OR HIGHER *CHILLS OR SWEATING *NAUSEA AND VOMITING THAT IS NOT CONTROLLED WITH YOUR NAUSEA MEDICATION *UNUSUAL SHORTNESS OF BREATH *UNUSUAL BRUISING OR BLEEDING *URINARY PROBLEMS (pain or burning when urinating, or frequent urination) *BOWEL PROBLEMS (unusual diarrhea, constipation, pain near the anus) TENDERNESS IN MOUTH AND THROAT WITH OR WITHOUT PRESENCE OF ULCERS (sore throat, sores in mouth, or a toothache) UNUSUAL RASH, SWELLING OR PAIN  UNUSUAL VAGINAL DISCHARGE OR ITCHING   Items with * indicate a potential emergency and should be followed up as soon as possible or go to the Emergency Department if any problems should occur.  Please show the CHEMOTHERAPY ALERT CARD or IMMUNOTHERAPY  ALERT CARD at check-in to the Emergency Department and triage nurse.  Should you have questions after your visit or need to cancel or reschedule your appointment, please contact [facility]  Dept: [phone]  and follow the prompts.  Office hours are 8:00 a.m. to 4:30 p.m. Monday - Friday. Please note that voicemails left after 4:00 p.m. may not be returned until the following business day.  We are closed weekends and major holidays. You have access to a nurse at all times for urgent questions. Please call the main number to the clinic Dept: [phone] and follow the prompts.   For any non-urgent questions, you may also contact your provider using MyChart. We now offer e-Visits for anyone 18 and older to request care online for non-urgent symptoms. For details visit [email or web address].   Also download the MyChart app! Go to the app store, search "MyChart", open the app, select [facility], and log in with your MyChart username and password.   

## 2022-10-05 ENCOUNTER — Other Ambulatory Visit: Payer: Self-pay

## 2022-10-06 ENCOUNTER — Ambulatory Visit (HOSPITAL_COMMUNITY)
Admission: RE | Admit: 2022-10-06 | Discharge: 2022-10-06 | Disposition: A | Discharge status: Home / Self Care | Payer: Medicare Other | Source: Ambulatory Visit | Attending: Hematology | Admitting: Hematology

## 2022-10-06 DIAGNOSIS — I10 Essential (primary) hypertension: Secondary | ICD-10-CM | POA: Diagnosis not present

## 2022-10-06 DIAGNOSIS — Z0189 Encounter for other specified special examinations: Secondary | ICD-10-CM | POA: Diagnosis not present

## 2022-10-06 DIAGNOSIS — Z01818 Encounter for other preprocedural examination: Secondary | ICD-10-CM | POA: Insufficient documentation

## 2022-10-06 LAB — ECHOCARDIOGRAM COMPLETE
AR max vel: 2.36 cm2
AV Area VTI: 2.39 cm2
AV Area mean vel: 2.18 cm2
AV Mean grad: 3 mmHg
AV Peak grad: 6.4 mmHg
Ao pk vel: 1.26 m/s
Area-P 1/2: 3.45 cm2
S' Lateral: 2.5 cm
Single Plane A4C EF: 69.6 %

## 2022-10-06 NOTE — Progress Notes (Signed)
  Echocardiogram 2D Echocardiogram has been performed.  Katherine Donovan Renold Don 10/06/2022, 8:29 AM

## 2022-10-23 ENCOUNTER — Other Ambulatory Visit: Payer: Self-pay

## 2022-11-08 ENCOUNTER — Telehealth: Payer: Self-pay | Admitting: *Deleted

## 2022-11-08 NOTE — Telephone Encounter (Signed)
Patient called to reschedule June 3rd appt. She can come in on 6/17 or 6/24 anytime in the morning. Patient will notate changes in her MyChart account. Message sent to scheduler.

## 2022-11-10 ENCOUNTER — Other Ambulatory Visit: Payer: Self-pay

## 2022-11-15 ENCOUNTER — Ambulatory Visit: Payer: Medicare Other | Attending: General Surgery

## 2022-11-15 VITALS — Wt 111.1 lb

## 2022-11-15 DIAGNOSIS — Z483 Aftercare following surgery for neoplasm: Secondary | ICD-10-CM

## 2022-11-15 NOTE — Therapy (Signed)
OUTPATIENT PHYSICAL THERAPY SOZO SCREENING NOTE   Patient Name: Katherine Donovan MRN: [id number] DOB:01-20-1946, 77 y.o., female Today's Date: 11/15/2022  PCP: [name], MD REFERRING PROVIDER: Emelia Loron, MD   PT End of Session - 11/15/22 [id number]     Visit Number 2   # unchanged due to screen only   PT Start Time 0805    PT Stop Time 0809    PT Time Calculation (min) 4 min    Activity Tolerance Patient tolerated treatment well    Behavior During Therapy [facility] for tasks assessed/performed             Past Medical History:  Diagnosis Date   History of bunionectomy 09/02/2020   Hyperlipidemia    Hyperlipidemia    left breast ILC 09/15/21   Osteoporosis    Personal history of chemotherapy    Personal history of radiation therapy    Rosacea    to face   Past Surgical History:  Procedure Laterality Date   BREAST LUMPECTOMY Left 2023   BREAST LUMPECTOMY WITH RADIOACTIVE SEED AND SENTINEL LYMPH NODE BIOPSY Left 02/04/2022   Procedure: LEFT BREAST SEED GUIDED LUMPECTOMY AND LEFT AXILLARY SENTINEL NODE BIOPSY;  Surgeon: Emelia Loron, MD;  Location: [facility];  Service: General;  Laterality: Left;  LMA & PEC BLOCK   PORTACATH PLACEMENT Right 10/06/2021   Procedure: INSERTION PORT-A-CATH;  Surgeon: Emelia Loron, MD;  Location: [facility];  Service: General;  Laterality: Right;   SIGMOIDOSCOPY  07/05/1997   TUBAL LIGATION     Patient Active Problem List   Diagnosis Date Noted   Encounter for immunization 10/19/2021   Port-A-Cath in place 10/14/2021   Malignant neoplasm of upper-outer quadrant of left breast in female, estrogen receptor positive ([facility]) 09/18/2021   Pure hypercholesterolemia 09/18/2015   Age-related osteoporosis with current pathological fracture with routine healing 09/18/2015    REFERRING DIAG: left breast cancer at risk for lymphedema  THERAPY DIAG: Aftercare following surgery for  neoplasm  PERTINENT HISTORY: Patient was diagnosed on 08/21/2021 with 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 It measures 3 cm and is located in the upper outer quadrant. It is ER positive, PR negative and HER2 positive with a Ki67 of 15%. Neoadjuvant chemotherapy TCHP. Left lumpectomy on 02/04/22 with 6 negative lymph nodes. Will be having radiation. Has osteoporosis.     PRECAUTIONS: left UE Lymphedema risk, None  SUBJECTIVE: Pt returns for her 3 month L-Dex screen.   PAIN:  Are you having pain? No  SOZO SCREENING: Patient was assessed today using the SOZO machine to determine the lymphedema index score. This was compared to her baseline score. It was determined that she is within the recommended range when compared to her baseline and no further action is needed at this time. She will continue SOZO screenings. These are done every 3 months for 2 years post operatively followed by every 6 months for 2 years, and then annually.   L-DEX FLOWSHEETS - 11/15/22 0800       L-DEX LYMPHEDEMA SCREENING   Measurement Type Unilateral    L-DEX MEASUREMENT EXTREMITY Upper Extremity    POSITION  Standing    DOMINANT SIDE Right    At Risk Side Left    BASELINE SCORE (UNILATERAL) 2.6    L-DEX SCORE (UNILATERAL) 2.1    VALUE CHANGE (UNILAT) -0.5               Hermenia Bers, PTA 11/15/2022, 8:11 AM

## 2022-11-16 ENCOUNTER — Other Ambulatory Visit: Payer: Self-pay

## 2022-12-06 ENCOUNTER — Other Ambulatory Visit: Payer: Medicare Other

## 2022-12-06 ENCOUNTER — Ambulatory Visit: Payer: Medicare Other | Admitting: Hematology

## 2022-12-19 NOTE — Assessment & Plan Note (Signed)
ILC, Stage IA, c(T1c, N0), ER+/PR-/HER2+, Grade 3  -found on screening mammogram. B/l MM and [place] on 09/01/21 showed a 2.9 cm left breast mass. Biopsy on 09/14/21 showed invasive lobular carcinoma, grade 2-3. -she completed 5 cycles neoadjuvant TCHP 10/07/21 - 12/30/21. Tolerated poorly overall with neuropathy, diarrhea, and low appetite. -left lumpectomy on 02/04/22 by Dr. Dwain Sarna showed complete pathological response, no residual carcinoma. -she completed maintenance trastuzumab in 09/2022.  -she has completed adjuvant RT -she is on Tamoxifen now, she is tolerating very well without significant side effects -Given node-negative disease, no significant benefit of adjuvant neratinib, and I do not recommend it. -She is clinically doing well, lab reviewed, exam was unremarkable, there is no clinical concern for recurrence -Continue cancer surveillance.

## 2022-12-19 NOTE — Assessment & Plan Note (Signed)
-  Her previous DEXA on 05/14/20 showing T-score of -3.6. repeated DEXA on 09/06/22 showed stable t-score -3.6 in right hip, I reviewed with her  -she reports being on Fosamax consistently for past 10 years, she stopped in early 2024. -she is on tamoxifen now which may improve her bone density

## 2022-12-20 ENCOUNTER — Inpatient Hospital Stay: Payer: Medicare Other | Attending: Hematology

## 2022-12-20 ENCOUNTER — Other Ambulatory Visit: Payer: Self-pay

## 2022-12-20 ENCOUNTER — Inpatient Hospital Stay (HOSPITAL_BASED_OUTPATIENT_CLINIC_OR_DEPARTMENT_OTHER): Payer: Medicare Other | Admitting: Hematology

## 2022-12-20 ENCOUNTER — Encounter: Payer: Self-pay | Admitting: Hematology

## 2022-12-20 VITALS — BP 134/97 | HR 67 | Temp 97.7°F | Wt 112.9 lb

## 2022-12-20 DIAGNOSIS — Z923 Personal history of irradiation: Secondary | ICD-10-CM | POA: Insufficient documentation

## 2022-12-20 DIAGNOSIS — G629 Polyneuropathy, unspecified: Secondary | ICD-10-CM | POA: Diagnosis not present

## 2022-12-20 DIAGNOSIS — Z170 Estrogen receptor positive status [ER+]: Secondary | ICD-10-CM | POA: Diagnosis not present

## 2022-12-20 DIAGNOSIS — Z9221 Personal history of antineoplastic chemotherapy: Secondary | ICD-10-CM | POA: Diagnosis not present

## 2022-12-20 DIAGNOSIS — M8000XD Age-related osteoporosis with current pathological fracture, unspecified site, subsequent encounter for fracture with routine healing: Secondary | ICD-10-CM | POA: Diagnosis not present

## 2022-12-20 DIAGNOSIS — Z9851 Tubal ligation status: Secondary | ICD-10-CM | POA: Diagnosis not present

## 2022-12-20 DIAGNOSIS — R197 Diarrhea, unspecified: Secondary | ICD-10-CM | POA: Diagnosis not present

## 2022-12-20 DIAGNOSIS — R232 Flushing: Secondary | ICD-10-CM | POA: Insufficient documentation

## 2022-12-20 DIAGNOSIS — Z79810 Long term (current) use of selective estrogen receptor modulators (SERMs): Secondary | ICD-10-CM | POA: Insufficient documentation

## 2022-12-20 DIAGNOSIS — Z79899 Other long term (current) drug therapy: Secondary | ICD-10-CM | POA: Diagnosis not present

## 2022-12-20 DIAGNOSIS — M810 Age-related osteoporosis without current pathological fracture: Secondary | ICD-10-CM | POA: Insufficient documentation

## 2022-12-20 DIAGNOSIS — C50412 Malignant neoplasm of upper-outer quadrant of left female breast: Secondary | ICD-10-CM | POA: Insufficient documentation

## 2022-12-20 LAB — CMP (CANCER CENTER ONLY)
ALT: 17 U/L (ref 0–44)
AST: 29 U/L (ref 15–41)
Albumin: 3.9 g/dL (ref 3.5–5.0)
Alkaline Phosphatase: 32 U/L — ABNORMAL LOW (ref 38–126)
Anion gap: 7 (ref 5–15)
BUN: 18 mg/dL (ref 8–23)
CO2: 25 mmol/L (ref 22–32)
Calcium: 10.2 mg/dL (ref 8.9–10.3)
Chloride: 106 mmol/L (ref 98–111)
Creatinine: 0.73 mg/dL (ref 0.44–1.00)
GFR, Estimated: >60 mL/min (ref >60)
Glucose, Bld: 93 mg/dL (ref 70–99)
Potassium: 4.4 mmol/L (ref 3.5–5.1)
Sodium: 138 mmol/L (ref 135–145)
Total Bilirubin: 0.6 mg/dL (ref 0.3–1.2)
Total Protein: 6.2 g/dL — ABNORMAL LOW (ref 6.5–8.1)

## 2022-12-20 LAB — CBC WITH DIFFERENTIAL (CANCER CENTER ONLY)
Abs Immature Granulocytes: 0.01 10*3/uL (ref 0.00–0.07)
Basophils Absolute: 0 10*3/uL (ref 0.0–0.1)
Basophils Relative: 1 %
Eosinophils Absolute: 0.1 10*3/uL (ref 0.0–0.5)
Eosinophils Relative: 1 %
HCT: 46 % (ref 36.0–46.0)
Hemoglobin: 15.6 g/dL — ABNORMAL HIGH (ref 12.0–15.0)
Immature Granulocytes: 0 %
Lymphocytes Relative: 19 %
Lymphs Abs: 1.4 10*3/uL (ref 0.7–4.0)
MCH: 30.9 pg (ref 26.0–34.0)
MCHC: 33.9 g/dL (ref 30.0–36.0)
MCV: 91.1 fL (ref 80.0–100.0)
Monocytes Absolute: 0.6 10*3/uL (ref 0.1–1.0)
Monocytes Relative: 9 %
Neutro Abs: 5 10*3/uL (ref 1.7–7.7)
Neutrophils Relative %: 70 %
Platelet Count: 225 10*3/uL (ref 150–400)
RBC: 5.05 MIL/uL (ref 3.87–5.11)
RDW: 12.5 % (ref 11.5–15.5)
WBC Count: 7.1 10*3/uL (ref 4.0–10.5)
nRBC: 0 % (ref 0.0–0.2)

## 2022-12-20 MED ORDER — TAMOXIFEN CITRATE 20 MG PO TABS: 20.0000 mg | ORAL_TABLET | Freq: Every day | ORAL | 3 refills | Status: DC

## 2022-12-20 NOTE — Progress Notes (Signed)
[facility]   [phone] [phone]   Clinic Follow up Note   Patient Care Team: [name], MD as PCP - General (Family Medicine) Pershing Proud, RN as Oncology Nurse Navigator Donnelly Angelica, RN as Oncology Nurse Navigator Emelia Loron, MD as Consulting Physician (General Surgery) Malachy Mood, MD as Consulting Physician (Hematology) Dorothy Puffer, MD as Consulting Physician (Radiation Oncology) Pollyann Samples, NP as Nurse Practitioner (Nurse Practitioner)  Date of Service:  12/20/2022  CHIEF COMPLAINT: f/u of left breast cancer  CURRENT THERAPY:   Tamoxifen started 04/2022    ASSESSMENT:  Katherine Donovan is a 77 y.o. female with   Malignant neoplasm of upper-outer quadrant of left breast in female, estrogen receptor positive (HCC) ILC, Stage IA, c(T1c, N0), ER+/PR-/HER2+, Grade 3  -found on screening mammogram. B/l MM and [place] on 09/01/21 showed a 2.9 cm left breast mass. Biopsy on 09/14/21 showed invasive lobular carcinoma, grade 2-3. -she completed 5 cycles neoadjuvant TCHP 10/07/21 - 12/30/21. Tolerated poorly overall with neuropathy, diarrhea, and low appetite. -left lumpectomy on 02/04/22 by Dr. Dwain Sarna showed complete pathological response, no residual carcinoma. -she completed maintenance trastuzumab in 09/2022.  -she has completed adjuvant RT -she is on Tamoxifen now, she is tolerating very well without significant side effects -Given node-negative disease, no significant benefit of adjuvant neratinib, and I do not recommend it. -She is clinically doing well, lab reviewed, exam was unremarkable, there is no clinical concern for recurrence -Continue cancer surveillance.  Age-related osteoporosis with current pathological fracture with routine healing -Her previous DEXA on 05/14/20 showing T-score of -3.6. repeated DEXA on 09/06/22 showed stable t-score -3.6 in right hip, I reviewed with her  -she reports being on Fosamax  consistently for past 10 years, she stopped in early 2024. -she is on tamoxifen now which may improve her bone density         PLAN: -lab reviewed -lab and f/u in 6 months      SUMMARY OF ONCOLOGIC HISTORY: Oncology History Overview Note   Cancer Staging  Malignant neoplasm of upper-outer quadrant of left breast in female, estrogen receptor positive (HCC) Staging form: Breast, AJCC 8th Edition - Clinical stage from 09/14/2021: Stage IA (cT1c, cN0, cM0, G3, ER+, PR-, HER2+) - Signed by Malachy Mood, MD on 09/22/2021    Malignant neoplasm of upper-outer quadrant of left breast in female, estrogen receptor positive (HCC)  09/01/2021 Mammogram   CLINICAL DATA:  Bilateral screening recall for a right breast asymmetry and left breast mass.   EXAM: DIGITAL DIAGNOSTIC BILATERAL MAMMOGRAM WITH TOMOSYNTHESIS AND CAD; ULTRASOUND LEFT BREAST LIMITED; ULTRASOUND RIGHT BREAST LIMITED  IMPRESSION: 1. There is a highly suspicious ill-defined mass in the upper-outer left breast. The mass measures at least 2.9 cm, and possibly up to 3.8 cm. Accurate measurement is difficult due to its ill-defined insinuating appearance.   2.  No evidence of left axillary lymphadenopathy.   3. No persistent suspicious mammographic or targeted sonographic abnormalities in the right breast.   09/14/2021 Cancer Staging   Staging form: Breast, AJCC 8th Edition - Clinical stage from 09/14/2021: Stage IA (cT1c, cN0, cM0, G3, ER+, PR-, HER2+) - Signed by Malachy Mood, MD on 09/22/2021 Stage prefix: Initial diagnosis Histologic grading system: 3 grade system   09/14/2021 Initial Biopsy   Diagnosis Breast, left, needle core biopsy, 1 o'clock, 4cmfn - INVASIVE MAMMARY CARCINOMA. SEE NOTE Diagnosis Note Carcinoma measures 1.3 cm in greatest linear dimension and appears grade 2-3.  Immunohistochemical  stain for E-cadherin is negative, consistent with a lobular phenotype.  PROGNOSTIC INDICATORS Results: The tumor cells  are POSITIVE for Her2 (3+). Estrogen Receptor: 50%, POSITIVE, MODERATE-WEAK STAINING INTENSITY Progesterone Receptor: 0%, NEGATIVE Proliferation Marker Ki67: 15%   09/18/2021 Initial Diagnosis   Malignant neoplasm of upper-outer quadrant of left breast in female, estrogen receptor positive (HCC)   09/21/2021 Imaging   EXAM: BILATERAL BREAST MRI WITH AND WITHOUT CONTRAST  IMPRESSION: 1. 3 cm biopsy-proven malignancy within the UPPER-OUTER LEFT breast. No evidence of multifocal, multicentric or contralateral malignancy. No abnormal lymph nodes.   10/07/2021 - 02/10/2022 Chemotherapy   Patient is on Treatment Plan : BREAST  Docetaxel + Carboplatin + Trastuzumab + Pertuzumab  (TCHP) q21d      10/16/2021 Genetic Testing   Negative genetic testing: no pathogenic variants detected in [facility] CancerNext-Expanded +RNAinsight Panel.  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 Report date is October 16, 2021.   The CancerNext-Expanded gene panel offered by [facility] and includes sequencing, rearrangement, and RNA analysis for the following 77 genes: AIP, ALK, APC, ATM, AXIN2, BAP1, BARD1, BLM, BMPR1A, BRCA1, BRCA2, BRIP1, CDC73, CDH1, CDK4, CDKN1B, CDKN2A, CHEK2, CTNNA1, DICER1, FANCC, FH, FLCN, GALNT12, KIF1B, LZTR1, MAX, MEN1, MET, MLH1, MSH2, MSH3, MSH6, MUTYH, NBN, NF1, NF2, NTHL1, PALB2, PHOX2B, PMS2, POT1, PRKAR1A, PTCH1, PTEN, RAD51C, RAD51D, RB1, RECQL, RET, SDHA, SDHAF2, SDHB, SDHC, SDHD, SMAD4, SMARCA4, SMARCB1, SMARCE1, STK11, SUFU, TMEM127, TP53, TSC1, TSC2, VHL and XRCC2 (sequencing and deletion/duplication); EGFR, EGLN1, HOXB13, KIT, MITF, PDGFRA, POLD1, and POLE (sequencing only); EPCAM and GREM1 (deletion/duplication only).   02/04/2022 Definitive Surgery   Lumpectomy by Dr. Othelia Pulling. BREAST, LEFT, LUMPECTOMY:  - Hyaline fibrosis and focal calcifications with no evidence of residual carcinoma - complete therapeutic response  - Mild fibrocystic change  - See  comment  B. LYMPH NODE, LEFT AXILLA, SENTINEL, EXCISION:  - Lymph node, negative for carcinoma (0/1)  C. LYMPH NODE, LEFT AXILLA, SENTINEL, EXCISION:  - Lymph node, negative for carcinoma (0/1)  D. LYMPH NODE, LEFT AXILLA, SENTINEL, EXCISION:  - Lymph node, negative for carcinoma (0/1)  E. LYMPH NODE, LEFT AXILLA, SENTINEL, EXCISION:  - Lymph node, negative for carcinoma (0/1)  F. LYMPH NODE, LEFT AXILLA, SENTINEL, EXCISION:  - Lymph node, negative for carcinoma (0/1)  G. LYMPH NODE, LEFT AXILLA, SENTINEL, EXCISION:  - Lymph node, negative for carcinoma (0/1)  H. BREAST, LEFT INFERIOR MARGIN, EXCISION:  - Hyaline fibrosis with no evidence of residual carcinoma  - Mild fibrocystic change with calcification   COMMENT:  The appropriate pathologic stage is ypT0 ypN0    02/04/2022 Cancer Staging   Staging form: Breast, AJCC 8th Edition - Pathologic stage from 02/04/2022: No Stage Recommended (ypT0, pN0(sn), cM0) - Signed by Pollyann Samples, NP on 02/10/2022 Stage prefix: Post-therapy Response to neoadjuvant therapy: Complete response Method of lymph node assessment: Sentinel lymph node biopsy   03/03/2022 - 08/19/2022 Chemotherapy   Patient is on Treatment Plan : BREAST Trastuzumab IV (8/6) or SQ (600) D1 q21d     03/23/2022 - 04/19/2022 Radiation Therapy   Intent: Curative Radiation Treatment Dates: 03/23/2022 through 04/19/2022 Site Technique Total Dose (Gy) Dose per Fx (Gy) Completed Fx Beam Energies  Breast, Left: Breast_L 3D 42.56/42.56 2.66 16/16 6XFFF  Breast, Left: Breast_L_Bst 3D 8/8 2 4/4 6X      04/2022 -  Anti-estrogen oral therapy   Began adjuvant Tamoxifen   06/16/2022 Survivorship   SCP delivered by Santiago Glad, NP   09/09/2022 -  Chemotherapy   Patient is on Treatment Plan : BREAST MAINTENANCE Trastuzumab IV (6) or SQ (600) D1 q21d X 11 Cycles        INTERVAL HISTORY:  Katherine Donovan is here for a follow up of breast cancer.  She was last seen by me on 09/09/2022.  She presents to the clinic alone. Pt is taking Tamoxifen . She has some hot flashes and some intermittent discharge that is clear. Pt state that her appetite is good.      All other systems were reviewed with the patient and are negative.  MEDICAL HISTORY:  Past Medical History:  Diagnosis Date   History of bunionectomy 09/02/2020   Hyperlipidemia    Hyperlipidemia    left breast ILC 09/15/21   Osteoporosis    Personal history of chemotherapy    Personal history of radiation therapy    Rosacea    to face    SURGICAL HISTORY: Past Surgical History:  Procedure Laterality Date   BREAST LUMPECTOMY Left 2023   BREAST LUMPECTOMY WITH RADIOACTIVE SEED AND SENTINEL LYMPH NODE BIOPSY Left 02/04/2022   Procedure: LEFT BREAST SEED GUIDED LUMPECTOMY AND LEFT AXILLARY SENTINEL NODE BIOPSY;  Surgeon: Emelia Loron, MD;  Location: [facility];  Service: General;  Laterality: Left;  LMA & PEC BLOCK   PORTACATH PLACEMENT Right 10/06/2021   Procedure: INSERTION PORT-A-CATH;  Surgeon: Emelia Loron, MD;  Location: [facility];  Service: General;  Laterality: Right;   SIGMOIDOSCOPY  07/05/1997   TUBAL LIGATION      I have reviewed the social history and family history with the patient and they are unchanged from previous note.  ALLERGIES:  has No Known Allergies.  MEDICATIONS:  Current Outpatient Medications  Medication Sig Dispense Refill   Calcium Carbonate-Vitamin D 600-400 MG-UNIT tablet Take 1 tablet by mouth daily.       CRESTOR 10 MG tablet Take 5 mg by mouth Daily.     Glucosamine 750 MG TABS Take 750 mg by mouth daily.       Lactobacillus Rhamnosus, GG, (RA PROBIOTIC DIGESTIVE CARE) CAPS Take 1 capsule by mouth 3 (three) times a week.     Misc Natural Products (JOINT SUPPORT COMPLEX PO) Take by mouth daily. [place] Joints-Cartilage-Bone Triple Action Joint Health; Undenatured Type II Collagen     Multiple Vitamin (MULTI-VITAMIN) tablet Take 1  tablet by mouth daily.     tamoxifen (NOLVADEX) 20 MG tablet Take 1 tablet (20 mg total) by mouth daily. 90 tablet 3   No current facility-administered medications for this visit.    PHYSICAL EXAMINATION: ECOG PERFORMANCE STATUS: 0 - Asymptomatic  Vitals:   12/20/22 1107 12/20/22 1117  BP: (!) 173/96 (!) 134/97  Pulse: 67   Temp: 97.7 F (36.5 C)   SpO2: 99%    Wt Readings from Last 3 Encounters:  12/20/22 112 lb 14.4 oz (51.2 kg)  11/15/22 118 lb (53.5 kg)  09/30/22 110 lb 4 oz (50 kg)     GENERAL:alert, no distress and comfortable SKIN: skin color normal, no rashes or significant lesions EYES: normal, Conjunctiva are pink and non-injected, sclera clear  NEURO: alert & oriented x 3 with fluent speech NECK: (-)supple, thyroid normal size, non-tender, without nodularity LYMPH:(-)  no palpable lymphadenopathy in the cervical, axillary  BREAST: RT breast  no palpable mass, LT breast a firm, minimum scar tissues. No palpable  mass breast exam benign.   LABORATORY DATA:  I have reviewed the data as listed  Latest Ref Rng & Units 12/20/2022   10:10 AM 09/30/2022    7:43 AM 09/09/2022    8:05 AM  CBC  WBC 4.0 - 10.5 K/uL 7.1  6.4  5.7   Hemoglobin 12.0 - 15.0 g/dL 63.0  16.0  10.9   Hematocrit 36.0 - 46.0 % 46.0  42.4  39.1   Platelets 150 - 400 K/uL 225  208  185         Latest Ref Rng & Units 12/20/2022   10:10 AM 09/30/2022    7:43 AM 09/09/2022    8:05 AM  CMP  Glucose 70 - 99 mg/dL 93  85  85   BUN 8 - 23 mg/dL 18  18  17    Creatinine 0.44 - 1.00 mg/dL 3.23  5.57  3.22   Sodium 135 - 145 mmol/L 138  140  141   Potassium 3.5 - 5.1 mmol/L 4.4  4.4  4.2   Chloride 98 - 111 mmol/L 106  107  107   CO2 22 - 32 mmol/L 25  27  27    Calcium 8.9 - 10.3 mg/dL 02.5  9.8  9.6   Total Protein 6.5 - 8.1 g/dL 6.2  6.3  6.1   Total Bilirubin 0.3 - 1.2 mg/dL 0.6  0.5  0.6   Alkaline Phos 38 - 126 U/L 32  30  32   AST 15 - 41 U/L 29  26  25    ALT 0 - 44 U/L 17  17  15         RADIOGRAPHIC STUDIES: I have personally reviewed the radiological images as listed and agreed with the findings in the report. No results found.    No orders of the defined types were placed in this encounter.  All questions were answered. The patient knows to call the clinic with any problems, questions or concerns. No barriers to learning was detected. The total time spent in the appointment was 25 minutes.     Malachy Mood, MD 12/20/2022   Carolin Coy, CMA, am acting as scribe for Malachy Mood, MD.   I have reviewed the above documentation for accuracy and completeness, and I agree with the above.

## 2022-12-21 ENCOUNTER — Other Ambulatory Visit: Payer: Self-pay

## 2022-12-24 ENCOUNTER — Other Ambulatory Visit: Payer: Self-pay

## 2022-12-31 NOTE — Progress Notes (Signed)
Requested Treating Physician Report completed by this Nurse on 12/27/22. Provider is unavailable to review and sign form at this time. Provider will return 01/05/23. Patient notified.

## 2023-02-21 ENCOUNTER — Ambulatory Visit: Payer: Medicare Other | Attending: General Surgery

## 2023-02-21 VITALS — Wt 114.1 lb

## 2023-02-21 DIAGNOSIS — Z483 Aftercare following surgery for neoplasm: Secondary | ICD-10-CM | POA: Insufficient documentation

## 2023-02-21 NOTE — Therapy (Signed)
OUTPATIENT PHYSICAL THERAPY SOZO SCREENING NOTE   Patient Name: Katherine Donovan MRN: [id number] DOB:1946-04-10, 77 y.o., female Today's Date: 02/21/2023  PCP: [name], MD REFERRING PROVIDER: Emelia Loron, MD   PT End of Session - 02/21/23 [phone]     Visit Number 2   # unchanged due to screen only   PT Start Time 0826    PT Stop Time 0830    PT Time Calculation (min) 4 min    Activity Tolerance Patient tolerated treatment well    Behavior During Therapy [facility] for tasks assessed/performed             Past Medical History:  Diagnosis Date   History of bunionectomy 09/02/2020   Hyperlipidemia    Hyperlipidemia    left breast ILC 09/15/21   Osteoporosis    Personal history of chemotherapy    Personal history of radiation therapy    Rosacea    to face   Past Surgical History:  Procedure Laterality Date   BREAST LUMPECTOMY Left 2023   BREAST LUMPECTOMY WITH RADIOACTIVE SEED AND SENTINEL LYMPH NODE BIOPSY Left 02/04/2022   Procedure: LEFT BREAST SEED GUIDED LUMPECTOMY AND LEFT AXILLARY SENTINEL NODE BIOPSY;  Surgeon: Emelia Loron, MD;  Location: [facility];  Service: General;  Laterality: Left;  LMA & PEC BLOCK   PORTACATH PLACEMENT Right 10/06/2021   Procedure: INSERTION PORT-A-CATH;  Surgeon: Emelia Loron, MD;  Location: [facility];  Service: General;  Laterality: Right;   SIGMOIDOSCOPY  07/05/1997   TUBAL LIGATION     Patient Active Problem List   Diagnosis Date Noted   Encounter for immunization 10/19/2021   Port-A-Cath in place 10/14/2021   Malignant neoplasm of upper-outer quadrant of left breast in female, estrogen receptor positive (HCC) 09/18/2021   Pure hypercholesterolemia 09/18/2015   Age-related osteoporosis with current pathological fracture with routine healing 09/18/2015    REFERRING DIAG: left breast cancer at risk for lymphedema  THERAPY DIAG: Aftercare following surgery for  neoplasm  PERTINENT HISTORY: Patient was diagnosed on 08/21/2021 with 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 It measures 3 cm and is located in the upper outer quadrant. It is ER positive, PR negative and HER2 positive with a Ki67 of 15%. Neoadjuvant chemotherapy TCHP. Left lumpectomy on 02/04/22 with 6 negative lymph nodes. Will be having radiation. Has osteoporosis.     PRECAUTIONS: left UE Lymphedema risk, None  SUBJECTIVE: Pt returns for her 3 month L-Dex screen.   PAIN:  Are you having pain? No  SOZO SCREENING: Patient was assessed today using the SOZO machine to determine the lymphedema index score. This was compared to her baseline score. It was determined that she is within the recommended range when compared to her baseline and no further action is needed at this time. She will continue SOZO screenings. These are done every 3 months for 2 years post operatively followed by every 6 months for 2 years, and then annually.   L-DEX FLOWSHEETS - 02/21/23 0800       L-DEX LYMPHEDEMA SCREENING   Measurement Type Unilateral    L-DEX MEASUREMENT EXTREMITY Upper Extremity    POSITION  Standing    DOMINANT SIDE Right    At Risk Side Left    BASELINE SCORE (UNILATERAL) 2.6    L-DEX SCORE (UNILATERAL) 6.9    VALUE CHANGE (UNILAT) 4.3               Hermenia Bers, PTA 02/21/2023, 8:31 AM

## 2023-04-22 ENCOUNTER — Emergency Department (HOSPITAL_COMMUNITY): Payer: Medicare Other

## 2023-04-22 ENCOUNTER — Inpatient Hospital Stay (HOSPITAL_COMMUNITY)
Admission: EM | Admit: 2023-04-22 | Discharge: 2023-04-24 | DRG: 083 | Disposition: A | Discharge status: Home / Self Care | Payer: Medicare Other | Attending: General Surgery | Admitting: General Surgery

## 2023-04-22 ENCOUNTER — Encounter (HOSPITAL_COMMUNITY): Payer: Self-pay | Admitting: Emergency Medicine

## 2023-04-22 ENCOUNTER — Other Ambulatory Visit: Payer: Self-pay

## 2023-04-22 ENCOUNTER — Inpatient Hospital Stay (HOSPITAL_COMMUNITY): Payer: Medicare Other

## 2023-04-22 DIAGNOSIS — S02113A Unspecified occipital condyle fracture, initial encounter for closed fracture: Secondary | ICD-10-CM | POA: Diagnosis present

## 2023-04-22 DIAGNOSIS — S0990XA Unspecified injury of head, initial encounter: Secondary | ICD-10-CM

## 2023-04-22 DIAGNOSIS — S065X1A Traumatic subdural hemorrhage with loss of consciousness of 30 minutes or less, initial encounter: Principal | ICD-10-CM | POA: Diagnosis present

## 2023-04-22 DIAGNOSIS — Z87891 Personal history of nicotine dependence: Secondary | ICD-10-CM | POA: Diagnosis not present

## 2023-04-22 DIAGNOSIS — I951 Orthostatic hypotension: Secondary | ICD-10-CM | POA: Diagnosis present

## 2023-04-22 DIAGNOSIS — R55 Syncope and collapse: Principal | ICD-10-CM | POA: Diagnosis present

## 2023-04-22 DIAGNOSIS — W01198A Fall on same level from slipping, tripping and stumbling with subsequent striking against other object, initial encounter: Secondary | ICD-10-CM | POA: Diagnosis present

## 2023-04-22 DIAGNOSIS — I609 Nontraumatic subarachnoid hemorrhage, unspecified: Secondary | ICD-10-CM

## 2023-04-22 DIAGNOSIS — M4854XA Collapsed vertebra, not elsewhere classified, thoracic region, initial encounter for fracture: Secondary | ICD-10-CM | POA: Diagnosis present

## 2023-04-22 DIAGNOSIS — R040 Epistaxis: Secondary | ICD-10-CM | POA: Diagnosis present

## 2023-04-22 DIAGNOSIS — D72828 Other elevated white blood cell count: Secondary | ICD-10-CM | POA: Diagnosis present

## 2023-04-22 DIAGNOSIS — K920 Hematemesis: Secondary | ICD-10-CM | POA: Diagnosis present

## 2023-04-22 DIAGNOSIS — W19XXXA Unspecified fall, initial encounter: Secondary | ICD-10-CM

## 2023-04-22 DIAGNOSIS — Z853 Personal history of malignant neoplasm of breast: Secondary | ICD-10-CM | POA: Diagnosis not present

## 2023-04-22 DIAGNOSIS — E785 Hyperlipidemia, unspecified: Secondary | ICD-10-CM | POA: Diagnosis present

## 2023-04-22 DIAGNOSIS — S066XAA Traumatic subarachnoid hemorrhage with loss of consciousness status unknown, initial encounter: Secondary | ICD-10-CM | POA: Diagnosis present

## 2023-04-22 DIAGNOSIS — S065XAA Traumatic subdural hemorrhage with loss of consciousness status unknown, initial encounter: Principal | ICD-10-CM | POA: Diagnosis present

## 2023-04-22 DIAGNOSIS — Z8249 Family history of ischemic heart disease and other diseases of the circulatory system: Secondary | ICD-10-CM | POA: Diagnosis not present

## 2023-04-22 DIAGNOSIS — Y92000 Kitchen of unspecified non-institutional (private) residence as  the place of occurrence of the external cause: Secondary | ICD-10-CM

## 2023-04-22 DIAGNOSIS — Z923 Personal history of irradiation: Secondary | ICD-10-CM | POA: Diagnosis not present

## 2023-04-22 DIAGNOSIS — Z9221 Personal history of antineoplastic chemotherapy: Secondary | ICD-10-CM

## 2023-04-22 DIAGNOSIS — S0291XA Unspecified fracture of skull, initial encounter for closed fracture: Secondary | ICD-10-CM

## 2023-04-22 DIAGNOSIS — E872 Acidosis, unspecified: Secondary | ICD-10-CM | POA: Diagnosis present

## 2023-04-22 DIAGNOSIS — Z79810 Long term (current) use of selective estrogen receptor modulators (SERMs): Secondary | ICD-10-CM

## 2023-04-22 DIAGNOSIS — M810 Age-related osteoporosis without current pathological fracture: Secondary | ICD-10-CM | POA: Diagnosis present

## 2023-04-22 LAB — CBC WITH DIFFERENTIAL/PLATELET
Abs Immature Granulocytes: 0.06 10*3/uL (ref 0.00–0.07)
Basophils Absolute: 0 10*3/uL (ref 0.0–0.1)
Basophils Relative: 0 %
Eosinophils Absolute: 0 10*3/uL (ref 0.0–0.5)
Eosinophils Relative: 0 %
HCT: 48.7 % — ABNORMAL HIGH (ref 36.0–46.0)
Hemoglobin: 16.1 g/dL — ABNORMAL HIGH (ref 12.0–15.0)
Immature Granulocytes: 0 %
Lymphocytes Relative: 4 %
Lymphs Abs: 0.6 10*3/uL — ABNORMAL LOW (ref 0.7–4.0)
MCH: 31.4 pg (ref 26.0–34.0)
MCHC: 33.1 g/dL (ref 30.0–36.0)
MCV: 94.9 fL (ref 80.0–100.0)
Monocytes Absolute: 0.6 10*3/uL (ref 0.1–1.0)
Monocytes Relative: 4 %
Neutro Abs: 14.2 10*3/uL — ABNORMAL HIGH (ref 1.7–7.7)
Neutrophils Relative %: 92 %
Platelets: 198 10*3/uL (ref 150–400)
RBC: 5.13 MIL/uL — ABNORMAL HIGH (ref 3.87–5.11)
RDW: 13.1 % (ref 11.5–15.5)
WBC: 15.6 10*3/uL — ABNORMAL HIGH (ref 4.0–10.5)
nRBC: 0 % (ref 0.0–0.2)

## 2023-04-22 LAB — COMPREHENSIVE METABOLIC PANEL
ALT: 21 U/L (ref 0–44)
AST: 41 U/L (ref 15–41)
Albumin: 3.8 g/dL (ref 3.5–5.0)
Alkaline Phosphatase: 33 U/L — ABNORMAL LOW (ref 38–126)
Anion gap: 13 (ref 5–15)
BUN: 16 mg/dL (ref 8–23)
CO2: 21 mmol/L — ABNORMAL LOW (ref 22–32)
Calcium: 9.3 mg/dL (ref 8.9–10.3)
Chloride: 101 mmol/L (ref 98–111)
Creatinine, Ser: 0.93 mg/dL (ref 0.44–1.00)
GFR, Estimated: >60 mL/min (ref >60)
Glucose, Bld: 155 mg/dL — ABNORMAL HIGH (ref 70–99)
Potassium: 4.3 mmol/L (ref 3.5–5.1)
Sodium: 135 mmol/L (ref 135–145)
Total Bilirubin: 0.8 mg/dL (ref 0.3–1.2)
Total Protein: 6.3 g/dL — ABNORMAL LOW (ref 6.5–8.1)

## 2023-04-22 LAB — TROPONIN I (HIGH SENSITIVITY)
Troponin I (High Sensitivity): 4 ng/L (ref <18)
Troponin I (High Sensitivity): 9 ng/L (ref <18)

## 2023-04-22 LAB — I-STAT CG4 LACTIC ACID, ED
Lactic Acid, Venous: 2.6 mmol/L (ref 0.5–1.9)
Lactic Acid, Venous: 1.9 mmol/L (ref 0.5–1.9)

## 2023-04-22 MED ORDER — TRAMADOL HCL 50 MG PO TABS: 50.0000 mg | ORAL_TABLET | Freq: Four times a day (QID) | ORAL | Status: DC | PRN

## 2023-04-22 MED ORDER — SODIUM CHLORIDE 0.9 % IV BOLUS
500.0000 mL | Freq: Once | INTRAVENOUS | Status: AC
  Administered 2023-04-22: 500 mL via INTRAVENOUS

## 2023-04-22 MED ORDER — ONDANSETRON 4 MG PO TBDP: 4.0000 mg | ORAL_TABLET | Freq: Four times a day (QID) | ORAL | Status: DC | PRN

## 2023-04-22 MED ORDER — METHOCARBAMOL 500 MG PO TABS
500.0000 mg | ORAL_TABLET | Freq: Three times a day (TID) | ORAL | Status: DC
  Administered 2023-04-22 – 2023-04-24 (×5): 500 mg via ORAL
  Filled 2023-04-22 (×5): qty 1

## 2023-04-22 MED ORDER — POLYETHYLENE GLYCOL 3350 17 G PO PACK: 17.0000 g | PACK | Freq: Every day | ORAL | Status: DC | PRN

## 2023-04-22 MED ORDER — MORPHINE SULFATE (PF) 2 MG/ML IV SOLN: 1.0000 mg | INTRAVENOUS | Status: DC | PRN

## 2023-04-22 MED ORDER — MELATONIN 3 MG PO TABS: 3.0000 mg | ORAL_TABLET | Freq: Every evening | ORAL | Status: DC | PRN

## 2023-04-22 MED ORDER — CLEVIDIPINE BUTYRATE 0.5 MG/ML IV EMUL
0.0000 mg/h | INTRAVENOUS | Status: DC
  Administered 2023-04-22: 2 mg/h via INTRAVENOUS
  Filled 2023-04-22: qty 100

## 2023-04-22 MED ORDER — LEVETIRACETAM 500 MG PO TABS
500.0000 mg | ORAL_TABLET | Freq: Two times a day (BID) | ORAL | Status: DC
  Administered 2023-04-22 – 2023-04-24 (×5): 500 mg via ORAL
  Filled 2023-04-22 (×5): qty 1

## 2023-04-22 MED ORDER — PROCHLORPERAZINE EDISYLATE 10 MG/2ML IJ SOLN
5.0000 mg | Freq: Once | INTRAMUSCULAR | Status: AC
  Administered 2023-04-22: 5 mg via INTRAVENOUS
  Filled 2023-04-22: qty 2

## 2023-04-22 MED ORDER — TRAMADOL HCL 50 MG PO TABS: 25.0000 mg | ORAL_TABLET | Freq: Four times a day (QID) | ORAL | Status: DC | PRN

## 2023-04-22 MED ORDER — IOHEXOL 350 MG/ML SOLN
75.0000 mL | Freq: Once | INTRAVENOUS | Status: AC | PRN
  Administered 2023-04-22: 75 mL via INTRAVENOUS

## 2023-04-22 MED ORDER — METOPROLOL TARTRATE 5 MG/5ML IV SOLN: 5.0000 mg | Freq: Four times a day (QID) | INTRAVENOUS | Status: DC | PRN

## 2023-04-22 MED ORDER — ACETAMINOPHEN 500 MG PO TABS
1000.0000 mg | ORAL_TABLET | Freq: Four times a day (QID) | ORAL | Status: DC
  Administered 2023-04-22 – 2023-04-24 (×6): 1000 mg via ORAL
  Filled 2023-04-22 (×6): qty 2

## 2023-04-22 MED ORDER — METHOCARBAMOL 1000 MG/10ML IJ SOLN: 500.0000 mg | Freq: Three times a day (TID) | INTRAMUSCULAR | Status: DC

## 2023-04-22 MED ORDER — TAMOXIFEN CITRATE 10 MG PO TABS
20.0000 mg | ORAL_TABLET | Freq: Every day | ORAL | Status: DC
  Administered 2023-04-22 – 2023-04-24 (×3): 20 mg via ORAL
  Filled 2023-04-22 (×4): qty 2

## 2023-04-22 MED ORDER — SODIUM CHLORIDE 0.9% FLUSH
3.0000 mL | Freq: Two times a day (BID) | INTRAVENOUS | Status: DC
  Administered 2023-04-22: 3 mL via INTRAVENOUS

## 2023-04-22 MED ORDER — DIPHENHYDRAMINE HCL 50 MG/ML IJ SOLN
25.0000 mg | Freq: Once | INTRAMUSCULAR | Status: AC
  Administered 2023-04-22: 25 mg via INTRAVENOUS
  Filled 2023-04-22: qty 1

## 2023-04-22 MED ORDER — DOCUSATE SODIUM 100 MG PO CAPS
100.0000 mg | ORAL_CAPSULE | Freq: Two times a day (BID) | ORAL | Status: DC
  Administered 2023-04-22 – 2023-04-24 (×4): 100 mg via ORAL
  Filled 2023-04-22 (×4): qty 1

## 2023-04-22 MED ORDER — SODIUM CHLORIDE 0.9% FLUSH: 3.0000 mL | INTRAVENOUS | Status: DC | PRN

## 2023-04-22 MED ORDER — ONDANSETRON HCL 4 MG/2ML IJ SOLN: 4.0000 mg | Freq: Four times a day (QID) | INTRAMUSCULAR | Status: DC | PRN

## 2023-04-22 MED ORDER — HYDRALAZINE HCL 20 MG/ML IJ SOLN: 10.0000 mg | INTRAMUSCULAR | Status: DC | PRN

## 2023-04-22 MED ORDER — ONDANSETRON HCL 4 MG/2ML IJ SOLN
4.0000 mg | Freq: Once | INTRAMUSCULAR | Status: AC
  Administered 2023-04-22: 4 mg via INTRAVENOUS
  Filled 2023-04-22: qty 2

## 2023-04-22 NOTE — ED Notes (Signed)
Verbal order by EDP to hold Cleviprex. Blood pressure is below parameters

## 2023-04-22 NOTE — Consult Note (Signed)
Reason for Consult: Traumatic skull fracture with subdural hemorrhage and subarachnoid hemorrhage. Referring Physician: Tanda Rockers, DO  Katherine Donovan is an 77 y.o. female.  HPI: Patient is a 77 year old individual who had a witnessed fall in the kitchen while she was going to get something from the refrigerator.  Recalls going to the refrigerator then recalls waking up on the floor.  According to her husband she had loss of consciousness of about 10 seconds.  He notes that she fell forward striking her head into the refrigerator door and then bouncing on the floor.  The patient does not recall that event.  A CT scan of the head demonstrates that the patient has some base of the skull fractures through the occipital condyles without displacement.  There is some intracranial air at the base of the skull in addition to a small right frontal contusion with some subarachnoid blood over the convexity.  A very small subdural hematoma can barely be appreciated.  CT scan of the cervical spine shows what I believed to be an old T2 compression fracture of the superior endplate that is stable none displaced.  Past Medical History:  Diagnosis Date   History of bunionectomy 09/02/2020   Hyperlipidemia    Hyperlipidemia    left breast ILC 09/15/21   Osteoporosis    Personal history of chemotherapy    Personal history of radiation therapy    Rosacea    to face    Past Surgical History:  Procedure Laterality Date   BREAST LUMPECTOMY Left 2023   BREAST LUMPECTOMY WITH RADIOACTIVE SEED AND SENTINEL LYMPH NODE BIOPSY Left 02/04/2022   Procedure: LEFT BREAST SEED GUIDED LUMPECTOMY AND LEFT AXILLARY SENTINEL NODE BIOPSY;  Surgeon: Emelia Loron, MD;  Location: [facility];  Service: General;  Laterality: Left;  LMA & PEC BLOCK   PORTACATH PLACEMENT Right 10/06/2021   Procedure: INSERTION PORT-A-CATH;  Surgeon: Emelia Loron, MD;  Location: [facility];  Service:  General;  Laterality: Right;   SIGMOIDOSCOPY  07/05/1997   TUBAL LIGATION      Family History  Problem Relation Age of Onset   Melanoma Mother        or other skin cancer; dx after 50; no systemic therapy needed   Hypertension Father    Heart attack Father    Other Sister 75       benign breast mass   Obesity Sister    Other Sister 54       benign breast mass   Cancer Paternal Uncle        unknown type; mets; d. late 47s   Colon cancer Neg Hx     Social History:  reports that she has quit smoking. Her smoking use included cigarettes. She has never used smokeless tobacco. She reports current alcohol use of about 3.0 - 5.0 standard drinks of alcohol per week. She reports that she does not use drugs.  Allergies: No Known Allergies  Medications: I have not reviewed the patient's meds  Results for orders placed or performed during the hospital encounter of 04/22/23 (from the past 48 hour(s))  CBC with Differential     Status: Abnormal   Collection Time: 04/22/23  9:37 AM  Result Value Ref Range   WBC 15.6 (H) 4.0 - 10.5 K/uL   RBC 5.13 (H) 3.87 - 5.11 MIL/uL   Hemoglobin 16.1 (H) 12.0 - 15.0 g/dL   HCT 16.1 (H) 09.6 - 04.5 %   MCV 94.9 80.0 -  100.0 fL   MCH 31.4 26.0 - 34.0 pg   MCHC 33.1 30.0 - 36.0 g/dL   RDW 29.5 62.1 - 30.8 %   Platelets 198 150 - 400 K/uL   nRBC 0.0 0.0 - 0.2 %   Neutrophils Relative % 92 %   Neutro Abs 14.2 (H) 1.7 - 7.7 K/uL   Lymphocytes Relative 4 %   Lymphs Abs 0.6 (L) 0.7 - 4.0 K/uL   Monocytes Relative 4 %   Monocytes Absolute 0.6 0.1 - 1.0 K/uL   Eosinophils Relative 0 %   Eosinophils Absolute 0.0 0.0 - 0.5 K/uL   Basophils Relative 0 %   Basophils Absolute 0.0 0.0 - 0.1 K/uL   Immature Granulocytes 0 %   Abs Immature Granulocytes 0.06 0.00 - 0.07 K/uL    Comment: Performed at [facility], [address] [place], [place] [address]  Comprehensive metabolic panel     Status: Abnormal   Collection Time: 04/22/23  9:37 AM  Result  Value Ref Range   Sodium 135 135 - 145 mmol/L   Potassium 4.3 3.5 - 5.1 mmol/L   Chloride 101 98 - 111 mmol/L   CO2 21 (L) 22 - 32 mmol/L   Glucose, Bld 155 (H) 70 - 99 mg/dL    Comment: Glucose reference range applies only to samples taken after fasting for at least 8 hours.   BUN 16 8 - 23 mg/dL   Creatinine, Ser 6.96 0.44 - 1.00 mg/dL   Calcium 9.3 8.9 - 29.5 mg/dL   Total Protein 6.3 (L) 6.5 - 8.1 g/dL   Albumin 3.8 3.5 - 5.0 g/dL   AST 41 15 - 41 U/L   ALT 21 0 - 44 U/L   Alkaline Phosphatase 33 (L) 38 - 126 U/L   Total Bilirubin 0.8 0.3 - 1.2 mg/dL   GFR, Estimated >28 >41 mL/min    Comment: (NOTE) Calculated using the CKD-EPI Creatinine Equation (2021)    Anion gap 13 5 - 15    Comment: Performed at [facility], [address], [place], [place] [address]  Troponin I (High Sensitivity)     Status: None   Collection Time: 04/22/23  9:37 AM  Result Value Ref Range   Troponin I (High Sensitivity) 4 <18 ng/L    Comment: (NOTE) Elevated high sensitivity troponin I (hsTnI) values and significant  changes across serial measurements may suggest ACS but many other  chronic and acute conditions are known to elevate hsTnI results.  Refer to the "Links" section for chest pain algorithms and additional  guidance. Performed at [facility], [address], [place], [place] [address]   I-Stat CG4 Lactic Acid     Status: Abnormal   Collection Time: 04/22/23  9:38 AM  Result Value Ref Range   Lactic Acid, Venous 2.6 (HH) 0.5 - 1.9 mmol/L   Comment NOTIFIED PHYSICIAN     DG Chest Portable 1 View  Result Date: 04/22/2023 CLINICAL DATA:  Fall. EXAM: PORTABLE CHEST 1 VIEW COMPARISON:  None Available. FINDINGS: The heart size and mediastinal contours are within normal limits. Aortic calcification. Both lungs are clear. No pneumothorax or pleural effusion. Surgical clips project over the left chest wall. No acute osseous abnormality. IMPRESSION: No acute findings chest.  Electronically Signed   By: Hart Robinsons M.D.   On: 04/22/2023 11:30   CT Head Wo Contrast  Result Date: 04/22/2023 CLINICAL DATA:  Trauma, pain EXAM: CT HEAD WITHOUT CONTRAST CT  MAXILLOFACIAL WITHOUT CONTRAST CT CERVICAL SPINE WITHOUT CONTRAST TECHNIQUE: Multidetector CT imaging of the head, cervical spine, and maxillofacial structures were performed using the standard protocol without intravenous contrast. Multiplanar CT image reconstructions of the cervical spine and maxillofacial structures were also generated. RADIATION DOSE REDUCTION: This exam was performed according to the departmental dose-optimization program which includes automated exposure control, adjustment of the mA and/or kV according to patient size and/or use of iterative reconstruction technique. COMPARISON:  None Available. FINDINGS: CT HEAD FINDINGS Brain: There is an acute subdural hematoma overlying the left cerebral convexity measuring up to 4 mm in maximal thickness overlying the parietal lobe (5-19). There is additional acute subdural and small volume subarachnoid blood overlying the right frontal lobe measuring up to 4 mm in thickness (3-12). There is no mass effect on the underlying brain parenchyma or midline shift. There is no evidence of acute territorial infarct. There is scattered pneumocephalus related to the below described calvarial fractures. Parenchymal volume is normal. The ventricles are normal in size, without evidence of intraventricular hemorrhage. Gray-white differentiation is preserved. The pituitary and suprasellar region are normal. There is no solid mass lesion. Vascular: There is calcification of the bilateral carotid siphons. Skull: There is an acute fracture of the occipital bone at the midline extending to the foramen magnum. There is an acute fracture of the right occipital condyle (7-28 of the facial bone sequence. There is an acute fracture of the right petrous apex extending to the posterior wall of  the carotid canal (4-53 of the facial bone images). There is probably an occult fracture of the wall of the right sphenoid sinus given pneumocephalus and hemosinus. Other: The mastoid air cells are clear without evidence of temporal bone fracture. CT MAXILLOFACIAL FINDINGS Osseous: No other acute facial bone fracture is seen. The pterygoid plates are intact. There is no evidence of mandibular dislocation. Orbits: The globes and orbits are intact. There is no retrobulbar hematoma. Sinuses: There is a right more than left sphenoid hemosinus. 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 Soft tissues: Unremarkable. CT CERVICAL SPINE FINDINGS Alignment: Normal.  There is no evidence of traumatic malalignment. Skull base and vertebrae: Skull base fractures are detailed above. Skull base alignment is maintained. There is age-indeterminate mild compression deformity of the superior T2 endplate (9-32). The other vertebral body heights are preserved. There is no other evidence of acute fracture. There is no suspicious osseous lesion. Soft tissues and spinal canal: No prevertebral fluid or swelling. No visible canal hematoma. Disc levels: There is mild disc space narrowing and degenerative endplate change at C4-C5 and C5-C6. There is no high-grade spinal canal stenosis. Upper chest: The imaged lung apices are clear. Other: None. IMPRESSION: CT HEAD: 1. Acute subdural hematomas overlying both cerebral convexities measuring up to approximately 4 mm in thickness bilaterally, and small volume subarachnoid hemorrhage overlying the right frontal lobe. No significant mass effect or midline shift. 2. Acute nondisplaced fracture of the occipital bone at the midline extending to the foramen magnum and acute nondisplaced fracture of the right occipital condyle. 3. Acute fracture of the right petrous apex extending to the posterior wall of the carotid canal. Recommend CTA of the head to evaluate for vascular injury. 4. Probable occult  fracture of the wall of the right sphenoid sinus given pneumocephalus and hemosinus. 5. No other acute facial bone fracture. 6. Age-indeterminate mild compression deformity of the superior T2 endplate. 7. Otherwise, no acute fracture or traumatic malalignment of the cervical spine. Critical Value/emergent results were  called by telephone at the time of interpretation on 04/22/2023 at 11:20 am to provider Tanda Rockers , who verbally acknowledged these results. Electronically Signed   By: Lesia Hausen M.D.   On: 04/22/2023 11:22   CT Cervical Spine Wo Contrast  Result Date: 04/22/2023 CLINICAL DATA:  Trauma, pain EXAM: CT HEAD WITHOUT CONTRAST CT MAXILLOFACIAL WITHOUT CONTRAST CT CERVICAL SPINE WITHOUT CONTRAST TECHNIQUE: Multidetector CT imaging of the head, cervical spine, and maxillofacial structures were performed using the standard protocol without intravenous contrast. Multiplanar CT image reconstructions of the cervical spine and maxillofacial structures were also generated. RADIATION DOSE REDUCTION: This exam was performed according to the departmental dose-optimization program which includes automated exposure control, adjustment of the mA and/or kV according to patient size and/or use of iterative reconstruction technique. COMPARISON:  None Available. FINDINGS: CT HEAD FINDINGS Brain: There is an acute subdural hematoma overlying the left cerebral convexity measuring up to 4 mm in maximal thickness overlying the parietal lobe (5-19). There is additional acute subdural and small volume subarachnoid blood overlying the right frontal lobe measuring up to 4 mm in thickness (3-12). There is no mass effect on the underlying brain parenchyma or midline shift. There is no evidence of acute territorial infarct. There is scattered pneumocephalus related to the below described calvarial fractures. Parenchymal volume is normal. The ventricles are normal in size, without evidence of intraventricular hemorrhage.  Gray-white differentiation is preserved. The pituitary and suprasellar region are normal. There is no solid mass lesion. Vascular: There is calcification of the bilateral carotid siphons. Skull: There is an acute fracture of the occipital bone at the midline extending to the foramen magnum. There is an acute fracture of the right occipital condyle (7-28 of the facial bone sequence. There is an acute fracture of the right petrous apex extending to the posterior wall of the carotid canal (4-53 of the facial bone images). There is probably an occult fracture of the wall of the right sphenoid sinus given pneumocephalus and hemosinus. Other: The mastoid air cells are clear without evidence of temporal bone fracture. CT MAXILLOFACIAL FINDINGS Osseous: No other acute facial bone fracture is seen. The pterygoid plates are intact. There is no evidence of mandibular dislocation. Orbits: The globes and orbits are intact. There is no retrobulbar hematoma. Sinuses: There is a right more than left sphenoid hemosinus. 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 Soft tissues: Unremarkable. CT CERVICAL SPINE FINDINGS Alignment: Normal.  There is no evidence of traumatic malalignment. Skull base and vertebrae: Skull base fractures are detailed above. Skull base alignment is maintained. There is age-indeterminate mild compression deformity of the superior T2 endplate (9-32). The other vertebral body heights are preserved. There is no other evidence of acute fracture. There is no suspicious osseous lesion. Soft tissues and spinal canal: No prevertebral fluid or swelling. No visible canal hematoma. Disc levels: There is mild disc space narrowing and degenerative endplate change at C4-C5 and C5-C6. There is no high-grade spinal canal stenosis. Upper chest: The imaged lung apices are clear. Other: None. IMPRESSION: CT HEAD: 1. Acute subdural hematomas overlying both cerebral convexities measuring up to approximately 4 mm in thickness  bilaterally, and small volume subarachnoid hemorrhage overlying the right frontal lobe. No significant mass effect or midline shift. 2. Acute nondisplaced fracture of the occipital bone at the midline extending to the foramen magnum and acute nondisplaced fracture of the right occipital condyle. 3. Acute fracture of the right petrous apex extending to the posterior wall of the carotid canal.  Recommend CTA of the head to evaluate for vascular injury. 4. Probable occult fracture of the wall of the right sphenoid sinus given pneumocephalus and hemosinus. 5. No other acute facial bone fracture. 6. Age-indeterminate mild compression deformity of the superior T2 endplate. 7. Otherwise, no acute fracture or traumatic malalignment of the cervical spine. Critical Value/emergent results were called by telephone at the time of interpretation on 04/22/2023 at 11:20 am to provider Tanda Rockers , who verbally acknowledged these results. Electronically Signed   By: Lesia Hausen M.D.   On: 04/22/2023 11:22   CT Maxillofacial Wo Contrast  Result Date: 04/22/2023 CLINICAL DATA:  Trauma, pain EXAM: CT HEAD WITHOUT CONTRAST CT MAXILLOFACIAL WITHOUT CONTRAST CT CERVICAL SPINE WITHOUT CONTRAST TECHNIQUE: Multidetector CT imaging of the head, cervical spine, and maxillofacial structures were performed using the standard protocol without intravenous contrast. Multiplanar CT image reconstructions of the cervical spine and maxillofacial structures were also generated. RADIATION DOSE REDUCTION: This exam was performed according to the departmental dose-optimization program which includes automated exposure control, adjustment of the mA and/or kV according to patient size and/or use of iterative reconstruction technique. COMPARISON:  None Available. FINDINGS: CT HEAD FINDINGS Brain: There is an acute subdural hematoma overlying the left cerebral convexity measuring up to 4 mm in maximal thickness overlying the parietal lobe (5-19). There  is additional acute subdural and small volume subarachnoid blood overlying the right frontal lobe measuring up to 4 mm in thickness (3-12). There is no mass effect on the underlying brain parenchyma or midline shift. There is no evidence of acute territorial infarct. There is scattered pneumocephalus related to the below described calvarial fractures. Parenchymal volume is normal. The ventricles are normal in size, without evidence of intraventricular hemorrhage. Gray-white differentiation is preserved. The pituitary and suprasellar region are normal. There is no solid mass lesion. Vascular: There is calcification of the bilateral carotid siphons. Skull: There is an acute fracture of the occipital bone at the midline extending to the foramen magnum. There is an acute fracture of the right occipital condyle (7-28 of the facial bone sequence. There is an acute fracture of the right petrous apex extending to the posterior wall of the carotid canal (4-53 of the facial bone images). There is probably an occult fracture of the wall of the right sphenoid sinus given pneumocephalus and hemosinus. Other: The mastoid air cells are clear without evidence of temporal bone fracture. CT MAXILLOFACIAL FINDINGS Osseous: No other acute facial bone fracture is seen. The pterygoid plates are intact. There is no evidence of mandibular dislocation. Orbits: The globes and orbits are intact. There is no retrobulbar hematoma. Sinuses: There is a right more than left sphenoid hemosinus. 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 Soft tissues: Unremarkable. CT CERVICAL SPINE FINDINGS Alignment: Normal.  There is no evidence of traumatic malalignment. Skull base and vertebrae: Skull base fractures are detailed above. Skull base alignment is maintained. There is age-indeterminate mild compression deformity of the superior T2 endplate (9-32). The other vertebral body heights are preserved. There is no other evidence of acute fracture. There is  no suspicious osseous lesion. Soft tissues and spinal canal: No prevertebral fluid or swelling. No visible canal hematoma. Disc levels: There is mild disc space narrowing and degenerative endplate change at C4-C5 and C5-C6. There is no high-grade spinal canal stenosis. Upper chest: The imaged lung apices are clear. Other: None. IMPRESSION: CT HEAD: 1. Acute subdural hematomas overlying both cerebral convexities measuring up to approximately 4 mm in thickness bilaterally, and  small volume subarachnoid hemorrhage overlying the right frontal lobe. No significant mass effect or midline shift. 2. Acute nondisplaced fracture of the occipital bone at the midline extending to the foramen magnum and acute nondisplaced fracture of the right occipital condyle. 3. Acute fracture of the right petrous apex extending to the posterior wall of the carotid canal. Recommend CTA of the head to evaluate for vascular injury. 4. Probable occult fracture of the wall of the right sphenoid sinus given pneumocephalus and hemosinus. 5. No other acute facial bone fracture. 6. Age-indeterminate mild compression deformity of the superior T2 endplate. 7. Otherwise, no acute fracture or traumatic malalignment of the cervical spine. Critical Value/emergent results were called by telephone at the time of interpretation on 04/22/2023 at 11:20 am to provider Tanda Rockers , who verbally acknowledged these results. Electronically Signed   By: Lesia Hausen M.D.   On: 04/22/2023 11:22    Review of Systems  Unable to perform ROS: Acuity of condition   Blood pressure (!) 155/77, pulse 69, temperature 98 F (36.7 C), temperature source Axillary, resp. rate 15, SpO2 99%. Physical Exam Constitutional:      Appearance: Normal appearance.  HENT:     Head: Normocephalic and atraumatic.     Right Ear: Tympanic membrane, ear canal and external ear normal.     Left Ear: Tympanic membrane and external ear normal.     Nose: Nose normal.     Mouth/Throat:      Mouth: Mucous membranes are moist.     Pharynx: Oropharynx is clear.  Eyes:     Extraocular Movements: Extraocular movements intact.     Conjunctiva/sclera: Conjunctivae normal.     Pupils: Pupils are equal, round, and reactive to light.  Abdominal:     General: Abdomen is flat. Bowel sounds are normal.     Palpations: Abdomen is soft.  Skin:    General: Skin is warm and dry.     Capillary Refill: Capillary refill takes less than 2 seconds.  Neurological:     General: No focal deficit present.     Mental Status: She is alert.  Psychiatric:        Mood and Affect: Mood normal.        Behavior: Behavior normal.        Thought Content: Thought content normal.        Judgment: Judgment normal.     Assessment/Plan: Closed head injury with base of the skull fracture demonstrating some intracranial air and small traumatic subarachnoid hemorrhage and subdural hemorrhage.  The patient will likely need workup for the syncopal episode that she experienced but this closed head injury should resolve on its own.  Shary Key Jery Hollern 04/22/2023, 12:40 PM

## 2023-04-22 NOTE — Plan of Care (Signed)
[facility] Tonsillectomy/Adenoidectomy, Postoperative PEDS care plan entered in error.

## 2023-04-22 NOTE — ED Notes (Signed)
Patient transported to CT with RN on monitor.

## 2023-04-22 NOTE — ED Provider Notes (Signed)
[place] EMERGENCY DEPARTMENT AT [facility] Provider Note  CSN: [id number] Arrival date & time: 04/22/23 0907  Chief Complaint(s) Loss of Consciousness  HPI Katherine Donovan is a 77 y.o. female with past medical history as below, significant for HLD, breast cancer, osteoporosis who presents to the ED with complaint of fall/loc.   Patient with ongoing fall this morning.  She reports that she remembers walking to the refrigerator and woke up on the floor looking her husband.  She had no prodrome, no chest pain or palpitations prior to the LOC.  Reports that she hit her face on the ground and had a nosebleed, vomited blood once or twice.  She has a headache that is not described as worse headache of her life but is fairly painful.  No numbness or tingling to extremities.  No ongoing nausea.  No chest pain, palpitations or dyspnea reported this time.  She has pain to her neck as well.  Pain to her nose.  Headache.  Otherwise no other areas of discomfort  Denies history of seizure.  Reports that she did have an episode of "blacking out" in the past but was not worked up.  No recent diet or medication changes.  No illicit drug use or alcohol use.  Past Medical History Past Medical History:  Diagnosis Date   History of bunionectomy 09/02/2020   Hyperlipidemia    Hyperlipidemia    left breast ILC 09/15/21   Osteoporosis    Personal history of chemotherapy    Personal history of radiation therapy    Rosacea    to face   Patient Active Problem List   Diagnosis Date Noted   Syncope 04/22/2023   Encounter for immunization 10/19/2021   Port-A-Cath in place 10/14/2021   Malignant neoplasm of upper-outer quadrant of left breast in female, estrogen receptor positive (HCC) 09/18/2021   Pure hypercholesterolemia 09/18/2015   Age-related osteoporosis with current pathological fracture with routine healing 09/18/2015   Home Medication(s) Prior to Admission medications   Medication  Sig Start Date End Date Taking? Authorizing Provider  Calcium Carbonate-Vitamin D 600-400 MG-UNIT tablet Take 1 tablet by mouth daily.     Yes [provider]  Lactobacillus Rhamnosus, GG, (RA PROBIOTIC DIGESTIVE CARE) CAPS Take 1 capsule by mouth 3 (three) times a week.   Yes [provider]  Misc Natural Products (JOINT SUPPORT COMPLEX PO) Take 1 tablet by mouth daily. [employer] Joints-Cartilage-Bone Triple Action Joint Health; Undenatured Type II Collagen   Yes [provider]  Multiple Vitamin (MULTI-VITAMIN) tablet Take 1 tablet by mouth daily.   Yes [provider]  tamoxifen (NOLVADEX) 20 MG tablet Take 1 tablet (20 mg total) by mouth daily. 12/20/22  Yes Malachy Mood, MD  Glucosamine 750 MG TABS Take 750 mg by mouth daily.   Patient not taking: Reported on 04/22/2023    [provider]  prochlorperazine (COMPAZINE) 10 MG tablet Take 1 tablet (10 mg total) by mouth every 6 (six) hours as needed (Nausea or vomiting). 09/23/21 02/10/22  Malachy Mood, MD  Past Surgical History Past Surgical History:  Procedure Laterality Date   BREAST LUMPECTOMY Left 2023   BREAST LUMPECTOMY WITH RADIOACTIVE SEED AND SENTINEL LYMPH NODE BIOPSY Left 02/04/2022   Procedure: LEFT BREAST SEED GUIDED LUMPECTOMY AND LEFT AXILLARY SENTINEL NODE BIOPSY;  Surgeon: Emelia Loron, MD;  Location: [facility];  Service: General;  Laterality: Left;  LMA & PEC BLOCK   PORTACATH PLACEMENT Right 10/06/2021   Procedure: INSERTION PORT-A-CATH;  Surgeon: Emelia Loron, MD;  Location: [facility];  Service: General;  Laterality: Right;   SIGMOIDOSCOPY  07/05/1997   TUBAL LIGATION     Family History Family History  Problem Relation Age of Onset   Melanoma Mother        or other skin cancer; dx after 11; no systemic therapy  needed   Hypertension Father    Heart attack Father    Other Sister 68       benign breast mass   Obesity Sister    Other Sister 37       benign breast mass   Cancer Paternal Uncle        unknown type; mets; d. late 69s   Colon cancer Neg Hx     Social History Social History   Tobacco Use   Smoking status: Former    Types: Cigarettes   Smokeless tobacco: Never   Tobacco comments:    Never really liked or began a habit.  Vaping Use   Vaping status: Never Used  Substance Use Topics   Alcohol use: Yes    Alcohol/week: 3.0 - 5.0 standard drinks of alcohol    Types: 3 - 5 Glasses of wine per week    Comment: wine social   Drug use: No   Allergies Patient has no known allergies.  Review of Systems Review of Systems  Constitutional:  Negative for chills and fever.  HENT:  Positive for nosebleeds.   Eyes:  Negative for photophobia.  Respiratory:  Negative for chest tightness and shortness of breath.   Cardiovascular:  Negative for chest pain and palpitations.  Gastrointestinal:  Positive for nausea and vomiting.  Genitourinary:  Negative for dysuria.  Musculoskeletal:  Positive for neck pain. Negative for neck stiffness.  Neurological:  Positive for syncope and headaches.  All other systems reviewed and are negative.   Physical Exam Vital Signs  I have reviewed the triage vital signs BP 137/69   Pulse 84   Temp 97.8 F (36.6 C) (Oral)   Resp 20   SpO2 95%  Physical Exam Vitals and nursing note reviewed.  Constitutional:      General: She is not in acute distress.    Appearance: Normal appearance.  HENT:     Head: Normocephalic and atraumatic. No raccoon eyes, Battle's sign, right periorbital erythema or left periorbital erythema.     Jaw: There is normal jaw occlusion. No trismus.     Right Ear: External ear normal.     Left Ear: External ear normal.     Nose: Nose normal.     Comments: Dried blood to nares b/l No septal hematoma No deformity      Mouth/Throat:     Mouth: Mucous membranes are moist.  Eyes:     General: No scleral icterus.       Right eye: No discharge.        Left eye: No discharge.     Extraocular Movements: Extraocular movements intact.     Pupils: Pupils are equal,  round, and reactive to light.  Cardiovascular:     Rate and Rhythm: Normal rate and regular rhythm.     Pulses: Normal pulses.     Heart sounds: Normal heart sounds.  Pulmonary:     Effort: Pulmonary effort is normal. No respiratory distress.     Breath sounds: Normal breath sounds. No stridor.  Abdominal:     General: Abdomen is flat. There is no distension.     Palpations: Abdomen is soft.     Tenderness: There is no abdominal tenderness.  Musculoskeletal:     Cervical back: No rigidity.     Right lower leg: No edema.     Left lower leg: No edema.  Skin:    General: Skin is warm and dry.     Capillary Refill: Capillary refill takes less than 2 seconds.  Neurological:     Mental Status: She is alert and oriented to person, place, and time.     GCS: GCS eye subscore is 4. GCS verbal subscore is 5. GCS motor subscore is 6.     Cranial Nerves: Cranial nerves 2-12 are intact. No facial asymmetry.     Sensory: Sensation is intact. No sensory deficit.     Motor: Motor function is intact. No weakness or pronator drift.     Coordination: Coordination is intact. Finger-Nose-Finger Test normal.     Comments: Gait testing deferred secondary to patient safety.  Strength 5/5 BLUE BLLE   Psychiatric:        Mood and Affect: Mood normal.        Behavior: Behavior normal. Behavior is cooperative.     ED Results and Treatments Labs (all labs ordered are listed, but only abnormal results are displayed) Labs Reviewed  CBC WITH DIFFERENTIAL/PLATELET - Abnormal; Notable for the following components:      Result Value   WBC 15.6 (*)    RBC 5.13 (*)    Hemoglobin 16.1 (*)    HCT 48.7 (*)    Neutro Abs 14.2 (*)    Lymphs Abs 0.6 (*)    All other  components within normal limits  COMPREHENSIVE METABOLIC PANEL - Abnormal; Notable for the following components:   CO2 21 (*)    Glucose, Bld 155 (*)    Total Protein 6.3 (*)    Alkaline Phosphatase 33 (*)    All other components within normal limits  I-STAT CG4 LACTIC ACID, ED - Abnormal; Notable for the following components:   Lactic Acid, Venous 2.6 (*)    All other components within normal limits  URINALYSIS, ROUTINE W REFLEX MICROSCOPIC  CBG MONITORING, ED  I-STAT CG4 LACTIC ACID, ED  TROPONIN I (HIGH SENSITIVITY)  TROPONIN I (HIGH SENSITIVITY)                                                                                                                          Radiology CT ANGIO HEAD NECK W WO CM  Result Date: 04/22/2023 CLINICAL DATA:  Skull fracture, assess for arterial injury. EXAM: CT ANGIOGRAPHY HEAD AND NECK WITH AND WITHOUT CONTRAST TECHNIQUE: Multidetector CT imaging of the head and neck was performed using the standard protocol during bolus administration of intravenous contrast. Multiplanar CT image reconstructions and MIPs were obtained to evaluate the vascular anatomy. Carotid stenosis measurements (when applicable) are obtained utilizing NASCET criteria, using the distal internal carotid diameter as the denominator. RADIATION DOSE REDUCTION: This exam was performed according to the departmental dose-optimization program which includes automated exposure control, adjustment of the mA and/or kV according to patient size and/or use of iterative reconstruction technique. CONTRAST:  75mL OMNIPAQUE IOHEXOL 350 MG/ML SOLN COMPARISON:  Same-day noncontrast CT head FINDINGS: CT HEAD FINDINGS Brain: 4 mm thick bilateral subdural hematomas are unchanged in size. There is unchanged small volume subarachnoid hemorrhage overlying the right anterior frontal lobe. There is no new acute intracranial hemorrhage or acute territorial infarct. There is no mass effect or midline shift. The  ventricles are stable in size, without intraventricular hemorrhage. Scattered pneumocephalus is again seen. Vascular: See below. Skull: Calvarial and skull base fractures are again seen, described in detail on the CT head and facial bones from earlier today. Sinuses/Orbits: Bilateral sphenoid hemosinus is again seen. The globes and orbits are unremarkable. Other: The mastoid air cells and middle ear cavities are clear. Review of the MIP images confirms the above findings CTA NECK FINDINGS Aortic arch: There is calcified plaque in the imaged aortic arch. The origins of the brachiocephalic and left common carotid arteries are not included within the field of view. The origin of the left subclavian artery is patent. The subclavian arteries are patent to the level imaged. Right carotid system: The right common, internal, and external carotid arteries are patent, with minimal plaque at the bifurcation but no hemodynamically significant stenosis or occlusion. There is no evidence of traumatic vascular injury. There is no dissection or aneurysm. Left carotid system: The left common, internal, and external carotid arteries are patent, without significant stenosis or occlusion. There is no evidence of traumatic vascular injury. There is no dissection or aneurysm. Vertebral arteries: The vertebral arteries are patent, without significant stenosis or occlusion there is no evidence of traumatic vascular injury. There is no evidence of dissection or aneurysm. Skeleton: The cervical spine is assessed on the same day cervical spine CT. Other neck: The soft tissues of the neck are unremarkable. Upper chest: The imaged lung apices are clear. Review of the MIP images confirms the above findings CTA HEAD FINDINGS Anterior circulation: There is mild calcified plaque in the intracranial ICAs without significant stenosis or occlusion. There is no evidence of traumatic injury to the intracranial carotid arteries related to the skull base  fractures. The bilateral MCAs and ACAS are patent, without proximal stenosis or occlusion. There is no aneurysm or AVM. Posterior circulation: The bilateral V4 segments are patent, diminutive on the right after the PICA origin which is favored congenital. The basilar artery is patent. The major cerebellar arteries appear patent. The bilateral PCAs are patent, without proximal stenosis or occlusion. Posterior communicating arteries are not identified. There is no aneurysm or AVM. Venous sinuses: There is no definite evidence of venous sinus thrombosis. Anatomic variants: None. Review of the MIP images confirms the above findings IMPRESSION: 1. Patent vasculature of the head and neck without evidence of traumatic injury. 2. Unchanged 4 mm thick bilateral subdural hematomas and small volume right frontal subarachnoid hemorrhage. 3. Calvarial and skull base fractures described on CT head and facial  bones from earlier the same day. Electronically Signed   By: Lesia Hausen M.D.   On: 04/22/2023 13:33   DG Chest Portable 1 View  Result Date: 04/22/2023 CLINICAL DATA:  Fall. EXAM: PORTABLE CHEST 1 VIEW COMPARISON:  None Available. FINDINGS: The heart size and mediastinal contours are within normal limits. Aortic calcification. Both lungs are clear. No pneumothorax or pleural effusion. Surgical clips project over the left chest wall. No acute osseous abnormality. IMPRESSION: No acute findings chest. Electronically Signed   By: Hart Robinsons M.D.   On: 04/22/2023 11:30   CT Head Wo Contrast  Result Date: 04/22/2023 CLINICAL DATA:  Trauma, pain EXAM: CT HEAD WITHOUT CONTRAST CT MAXILLOFACIAL WITHOUT CONTRAST CT CERVICAL SPINE WITHOUT CONTRAST TECHNIQUE: Multidetector CT imaging of the head, cervical spine, and maxillofacial structures were performed using the standard protocol without intravenous contrast. Multiplanar CT image reconstructions of the cervical spine and maxillofacial structures were also generated.  RADIATION DOSE REDUCTION: This exam was performed according to the departmental dose-optimization program which includes automated exposure control, adjustment of the mA and/or kV according to patient size and/or use of iterative reconstruction technique. COMPARISON:  None Available. FINDINGS: CT HEAD FINDINGS Brain: There is an acute subdural hematoma overlying the left cerebral convexity measuring up to 4 mm in maximal thickness overlying the parietal lobe (5-19). There is additional acute subdural and small volume subarachnoid blood overlying the right frontal lobe measuring up to 4 mm in thickness (3-12). There is no mass effect on the underlying brain parenchyma or midline shift. There is no evidence of acute territorial infarct. There is scattered pneumocephalus related to the below described calvarial fractures. Parenchymal volume is normal. The ventricles are normal in size, without evidence of intraventricular hemorrhage. Jalin Erpelding-white differentiation is preserved. The pituitary and suprasellar region are normal. There is no solid mass lesion. Vascular: There is calcification of the bilateral carotid siphons. Skull: There is an acute fracture of the occipital bone at the midline extending to the foramen magnum. There is an acute fracture of the right occipital condyle (7-28 of the facial bone sequence. There is an acute fracture of the right petrous apex extending to the posterior wall of the carotid canal (4-53 of the facial bone images). There is probably an occult fracture of the wall of the right sphenoid sinus given pneumocephalus and hemosinus. Other: The mastoid air cells are clear without evidence of temporal bone fracture. CT MAXILLOFACIAL FINDINGS Osseous: No other acute facial bone fracture is seen. The pterygoid plates are intact. There is no evidence of mandibular dislocation. Orbits: The globes and orbits are intact. There is no retrobulbar hematoma. Sinuses: There is a right more than left  sphenoid hemosinus. 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 Soft tissues: Unremarkable. CT CERVICAL SPINE FINDINGS Alignment: Normal.  There is no evidence of traumatic malalignment. Skull base and vertebrae: Skull base fractures are detailed above. Skull base alignment is maintained. There is age-indeterminate mild compression deformity of the superior T2 endplate (9-32). The other vertebral body heights are preserved. There is no other evidence of acute fracture. There is no suspicious osseous lesion. Soft tissues and spinal canal: No prevertebral fluid or swelling. No visible canal hematoma. Disc levels: There is mild disc space narrowing and degenerative endplate change at C4-C5 and C5-C6. There is no high-grade spinal canal stenosis. Upper chest: The imaged lung apices are clear. Other: None. IMPRESSION: CT HEAD: 1. Acute subdural hematomas overlying both cerebral convexities measuring up to approximately 4 mm in thickness bilaterally,  and small volume subarachnoid hemorrhage overlying the right frontal lobe. No significant mass effect or midline shift. 2. Acute nondisplaced fracture of the occipital bone at the midline extending to the foramen magnum and acute nondisplaced fracture of the right occipital condyle. 3. Acute fracture of the right petrous apex extending to the posterior wall of the carotid canal. Recommend CTA of the head to evaluate for vascular injury. 4. Probable occult fracture of the wall of the right sphenoid sinus given pneumocephalus and hemosinus. 5. No other acute facial bone fracture. 6. Age-indeterminate mild compression deformity of the superior T2 endplate. 7. Otherwise, no acute fracture or traumatic malalignment of the cervical spine. Critical Value/emergent results were called by telephone at the time of interpretation on 04/22/2023 at 11:20 am to provider Tanda Rockers , who verbally acknowledged these results. Electronically Signed   By: Lesia Hausen M.D.   On: 04/22/2023 11:22    CT Cervical Spine Wo Contrast  Result Date: 04/22/2023 CLINICAL DATA:  Trauma, pain EXAM: CT HEAD WITHOUT CONTRAST CT MAXILLOFACIAL WITHOUT CONTRAST CT CERVICAL SPINE WITHOUT CONTRAST TECHNIQUE: Multidetector CT imaging of the head, cervical spine, and maxillofacial structures were performed using the standard protocol without intravenous contrast. Multiplanar CT image reconstructions of the cervical spine and maxillofacial structures were also generated. RADIATION DOSE REDUCTION: This exam was performed according to the departmental dose-optimization program which includes automated exposure control, adjustment of the mA and/or kV according to patient size and/or use of iterative reconstruction technique. COMPARISON:  None Available. FINDINGS: CT HEAD FINDINGS Brain: There is an acute subdural hematoma overlying the left cerebral convexity measuring up to 4 mm in maximal thickness overlying the parietal lobe (5-19). There is additional acute subdural and small volume subarachnoid blood overlying the right frontal lobe measuring up to 4 mm in thickness (3-12). There is no mass effect on the underlying brain parenchyma or midline shift. There is no evidence of acute territorial infarct. There is scattered pneumocephalus related to the below described calvarial fractures. Parenchymal volume is normal. The ventricles are normal in size, without evidence of intraventricular hemorrhage. Charika Mikelson-white differentiation is preserved. The pituitary and suprasellar region are normal. There is no solid mass lesion. Vascular: There is calcification of the bilateral carotid siphons. Skull: There is an acute fracture of the occipital bone at the midline extending to the foramen magnum. There is an acute fracture of the right occipital condyle (7-28 of the facial bone sequence. There is an acute fracture of the right petrous apex extending to the posterior wall of the carotid canal (4-53 of the facial bone images). There is  probably an occult fracture of the wall of the right sphenoid sinus given pneumocephalus and hemosinus. Other: The mastoid air cells are clear without evidence of temporal bone fracture. CT MAXILLOFACIAL FINDINGS Osseous: No other acute facial bone fracture is seen. The pterygoid plates are intact. There is no evidence of mandibular dislocation. Orbits: The globes and orbits are intact. There is no retrobulbar hematoma. Sinuses: There is a right more than left sphenoid hemosinus. 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 Soft tissues: Unremarkable. CT CERVICAL SPINE FINDINGS Alignment: Normal.  There is no evidence of traumatic malalignment. Skull base and vertebrae: Skull base fractures are detailed above. Skull base alignment is maintained. There is age-indeterminate mild compression deformity of the superior T2 endplate (9-32). The other vertebral body heights are preserved. There is no other evidence of acute fracture. There is no suspicious osseous lesion. Soft tissues and spinal canal: No prevertebral fluid  or swelling. No visible canal hematoma. Disc levels: There is mild disc space narrowing and degenerative endplate change at C4-C5 and C5-C6. There is no high-grade spinal canal stenosis. Upper chest: The imaged lung apices are clear. Other: None. IMPRESSION: CT HEAD: 1. Acute subdural hematomas overlying both cerebral convexities measuring up to approximately 4 mm in thickness bilaterally, and small volume subarachnoid hemorrhage overlying the right frontal lobe. No significant mass effect or midline shift. 2. Acute nondisplaced fracture of the occipital bone at the midline extending to the foramen magnum and acute nondisplaced fracture of the right occipital condyle. 3. Acute fracture of the right petrous apex extending to the posterior wall of the carotid canal. Recommend CTA of the head to evaluate for vascular injury. 4. Probable occult fracture of the wall of the right sphenoid sinus given  pneumocephalus and hemosinus. 5. No other acute facial bone fracture. 6. Age-indeterminate mild compression deformity of the superior T2 endplate. 7. Otherwise, no acute fracture or traumatic malalignment of the cervical spine. Critical Value/emergent results were called by telephone at the time of interpretation on 04/22/2023 at 11:20 am to provider Tanda Rockers , who verbally acknowledged these results. Electronically Signed   By: Lesia Hausen M.D.   On: 04/22/2023 11:22   CT Maxillofacial Wo Contrast  Result Date: 04/22/2023 CLINICAL DATA:  Trauma, pain EXAM: CT HEAD WITHOUT CONTRAST CT MAXILLOFACIAL WITHOUT CONTRAST CT CERVICAL SPINE WITHOUT CONTRAST TECHNIQUE: Multidetector CT imaging of the head, cervical spine, and maxillofacial structures were performed using the standard protocol without intravenous contrast. Multiplanar CT image reconstructions of the cervical spine and maxillofacial structures were also generated. RADIATION DOSE REDUCTION: This exam was performed according to the departmental dose-optimization program which includes automated exposure control, adjustment of the mA and/or kV according to patient size and/or use of iterative reconstruction technique. COMPARISON:  None Available. FINDINGS: CT HEAD FINDINGS Brain: There is an acute subdural hematoma overlying the left cerebral convexity measuring up to 4 mm in maximal thickness overlying the parietal lobe (5-19). There is additional acute subdural and small volume subarachnoid blood overlying the right frontal lobe measuring up to 4 mm in thickness (3-12). There is no mass effect on the underlying brain parenchyma or midline shift. There is no evidence of acute territorial infarct. There is scattered pneumocephalus related to the below described calvarial fractures. Parenchymal volume is normal. The ventricles are normal in size, without evidence of intraventricular hemorrhage. Sula Fetterly-white differentiation is preserved. The pituitary and  suprasellar region are normal. There is no solid mass lesion. Vascular: There is calcification of the bilateral carotid siphons. Skull: There is an acute fracture of the occipital bone at the midline extending to the foramen magnum. There is an acute fracture of the right occipital condyle (7-28 of the facial bone sequence. There is an acute fracture of the right petrous apex extending to the posterior wall of the carotid canal (4-53 of the facial bone images). There is probably an occult fracture of the wall of the right sphenoid sinus given pneumocephalus and hemosinus. Other: The mastoid air cells are clear without evidence of temporal bone fracture. CT MAXILLOFACIAL FINDINGS Osseous: No other acute facial bone fracture is seen. The pterygoid plates are intact. There is no evidence of mandibular dislocation. Orbits: The globes and orbits are intact. There is no retrobulbar hematoma. Sinuses: There is a right more than left sphenoid hemosinus. 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 Soft tissues: Unremarkable. CT CERVICAL SPINE FINDINGS Alignment: Normal.  There is no evidence of  traumatic malalignment. Skull base and vertebrae: Skull base fractures are detailed above. Skull base alignment is maintained. There is age-indeterminate mild compression deformity of the superior T2 endplate (9-32). The other vertebral body heights are preserved. There is no other evidence of acute fracture. There is no suspicious osseous lesion. Soft tissues and spinal canal: No prevertebral fluid or swelling. No visible canal hematoma. Disc levels: There is mild disc space narrowing and degenerative endplate change at C4-C5 and C5-C6. There is no high-grade spinal canal stenosis. Upper chest: The imaged lung apices are clear. Other: None. IMPRESSION: CT HEAD: 1. Acute subdural hematomas overlying both cerebral convexities measuring up to approximately 4 mm in thickness bilaterally, and small volume subarachnoid hemorrhage overlying  the right frontal lobe. No significant mass effect or midline shift. 2. Acute nondisplaced fracture of the occipital bone at the midline extending to the foramen magnum and acute nondisplaced fracture of the right occipital condyle. 3. Acute fracture of the right petrous apex extending to the posterior wall of the carotid canal. Recommend CTA of the head to evaluate for vascular injury. 4. Probable occult fracture of the wall of the right sphenoid sinus given pneumocephalus and hemosinus. 5. No other acute facial bone fracture. 6. Age-indeterminate mild compression deformity of the superior T2 endplate. 7. Otherwise, no acute fracture or traumatic malalignment of the cervical spine. Critical Value/emergent results were called by telephone at the time of interpretation on 04/22/2023 at 11:20 am to provider Tanda Rockers , who verbally acknowledged these results. Electronically Signed   By: Lesia Hausen M.D.   On: 04/22/2023 11:22    Pertinent labs & imaging results that were available during my care of the patient were reviewed by me and considered in my medical decision making (see MDM for details).  Medications Ordered in ED Medications  tamoxifen (NOLVADEX) tablet 20 mg (has no administration in time range)  acetaminophen (TYLENOL) tablet 1,000 mg (has no administration in time range)  methocarbamol (ROBAXIN) tablet 500 mg (has no administration in time range)    Or  methocarbamol (ROBAXIN) injection 500 mg (has no administration in time range)  docusate sodium (COLACE) capsule 100 mg (has no administration in time range)  polyethylene glycol (MIRALAX / GLYCOLAX) packet 17 g (has no administration in time range)  ondansetron (ZOFRAN-ODT) disintegrating tablet 4 mg (has no administration in time range)    Or  ondansetron (ZOFRAN) injection 4 mg (has no administration in time range)  metoprolol tartrate (LOPRESSOR) injection 5 mg (has no administration in time range)  hydrALAZINE (APRESOLINE)  injection 10 mg (has no administration in time range)  sodium chloride flush (NS) 0.9 % injection 3 mL (has no administration in time range)  sodium chloride flush (NS) 0.9 % injection 3 mL (has no administration in time range)  traMADol (ULTRAM) tablet 50 mg (has no administration in time range)  traMADol (ULTRAM) tablet 25 mg (has no administration in time range)  morphine (PF) 2 MG/ML injection 1-2 mg (has no administration in time range)  melatonin tablet 3 mg (has no administration in time range)  levETIRAcetam (KEPPRA) tablet 500 mg (has no administration in time range)  ondansetron (ZOFRAN) injection 4 mg (4 mg Intravenous Given 04/22/23 0954)  prochlorperazine (COMPAZINE) injection 5 mg (5 mg Intravenous Given 04/22/23 1106)  diphenhydrAMINE (BENADRYL) injection 25 mg (25 mg Intravenous Given 04/22/23 1106)  sodium chloride 0.9 % bolus 500 mL (0 mLs Intravenous Stopped 04/22/23 1145)  iohexol (OMNIPAQUE) 350 MG/ML injection 75 mL (75 mLs Intravenous  Contrast Given 04/22/23 1311)                                                                                                                                     Procedures .Critical Care  Performed by: Sloan Leiter, DO Authorized by: Sloan Leiter, DO   Critical care provider statement:    Critical care time (minutes):  34   Critical care time was exclusive of:  Separately billable procedures and treating other patients   Critical care was necessary to treat or prevent imminent or life-threatening deterioration of the following conditions:  Trauma   Critical care was time spent personally by me on the following activities:  Development of treatment plan with patient or surrogate, discussions with consultants, evaluation of patient's response to treatment, examination of patient, ordering and review of laboratory studies, ordering and review of radiographic studies, ordering and performing treatments and interventions, pulse oximetry,  re-evaluation of patient's condition, review of old charts and obtaining history from patient or surrogate   Care discussed with: admitting provider     (including critical care time)  Medical Decision Making / ED Course    Medical Decision Making:    RAINE BLOYER is a 77 y.o. female  with past medical history as below, significant for HLD, breast cancer, osteoporosis who presents to the ED with complaint of fall/loc. . The complaint involves an extensive differential diagnosis and also carries with it a high risk of complications and morbidity.  Serious etiology was considered. Ddx includes but is not limited to: Differential diagnoses for head trauma includes subdural hematoma, epidural hematoma, acute concussion, traumatic subarachnoid hemorrhage, cerebral contusions, seizure, arrhythmia, metabolic derangement etc.   Complete initial physical exam performed, notably the patient  was neuroexam is nonfocal, no acute distress.    Reviewed and confirmed nursing documentation for past medical history, family history, social history.  Vital signs reviewed.    Clinical Course as of 04/22/23 1651  Fri Apr 22, 2023  1118 Received cb from radiology in regards to [facility], will get addt'nl imaging CTA head/neck [SG]  1132 CTH b/l SDH 4mm, SAH right frontal w/o mass effect. Occipital bone fx nondisplaced at midline to foramen magnum and right occipital condyle. Right petrous apex fx to carotid canal. Right sphenoid sinus fx. Compression fx T2 age indeterminate  per radiology  [SG]  1135 Ordered cleviprex given SAH [SG]  53 Spoke with Dr Danielle Dess, no surgical intervention at this time. Will stay on as consult.  [SG]  1308 Pt will require admission for syncope w/u  [SG]  1358 Lactic Acid, Venous(!!): 2.6 [SG]  1436 Will admit under trauma service, IMTS consulted for syncope w/u [SG]    Clinical Course User Index [SG] Sloan Leiter, DO     Patient with syncopal episode, no prodrome.  Appears  high risk. She is amnesic to event.  Will obtain screening labs and imaging.  Recommend admission.  She is agreeable  Headache improving  Will admit to trauma service given traumatic injuries and medical service to consult for syncope w/u               Additional history obtained: -Additional history obtained from spouse -External records from outside source obtained and reviewed including: Chart review including previous notes, labs, imaging, consultation notes including  Home medications Oncology office notes   Lab Tests: -I ordered, reviewed, and interpreted labs.   The pertinent results include:   Labs Reviewed  CBC WITH DIFFERENTIAL/PLATELET - Abnormal; Notable for the following components:      Result Value   WBC 15.6 (*)    RBC 5.13 (*)    Hemoglobin 16.1 (*)    HCT 48.7 (*)    Neutro Abs 14.2 (*)    Lymphs Abs 0.6 (*)    All other components within normal limits  COMPREHENSIVE METABOLIC PANEL - Abnormal; Notable for the following components:   CO2 21 (*)    Glucose, Bld 155 (*)    Total Protein 6.3 (*)    Alkaline Phosphatase 33 (*)    All other components within normal limits  I-STAT CG4 LACTIC ACID, ED - Abnormal; Notable for the following components:   Lactic Acid, Venous 2.6 (*)    All other components within normal limits  URINALYSIS, ROUTINE W REFLEX MICROSCOPIC  CBG MONITORING, ED  I-STAT CG4 LACTIC ACID, ED  TROPONIN I (HIGH SENSITIVITY)  TROPONIN I (HIGH SENSITIVITY)    Notable for LA +, WBC+   EKG   EKG Interpretation Date/Time:  Friday April 22 2023 09:18:34 EDT Ventricular Rate:  58 PR Interval:  191 QRS Duration:  88 QT Interval:  498 QTC Calculation: 490 R Axis:   88  Text Interpretation: Sinus rhythm Anterior infarct, old no prior no stemi Confirmed by Tanda Rockers ([id number]) on 04/22/2023 9:20:20 AM         Imaging Studies ordered: I ordered imaging studies including CT head/cervical spine/ CTA head/neck, CT face, CXR I  independently visualized the following imaging with scope of interpretation limited to determining acute life threatening conditions related to emergency care; findings noted above I independently visualized and interpreted imaging. I agree with the radiologist interpretation   Medicines ordered and prescription drug management: Meds ordered this encounter  Medications   ondansetron (ZOFRAN) injection 4 mg   prochlorperazine (COMPAZINE) injection 5 mg   diphenhydrAMINE (BENADRYL) injection 25 mg   sodium chloride 0.9 % bolus 500 mL   DISCONTD: clevidipine (CLEVIPREX) infusion 0.5 mg/mL   iohexol (OMNIPAQUE) 350 MG/ML injection 75 mL   tamoxifen (NOLVADEX) tablet 20 mg   acetaminophen (TYLENOL) tablet 1,000 mg   OR Linked Order Group    methocarbamol (ROBAXIN) tablet 500 mg    methocarbamol (ROBAXIN) injection 500 mg   docusate sodium (COLACE) capsule 100 mg   polyethylene glycol (MIRALAX / GLYCOLAX) packet 17 g   OR Linked Order Group    ondansetron (ZOFRAN-ODT) disintegrating tablet 4 mg    ondansetron (ZOFRAN) injection 4 mg   metoprolol tartrate (LOPRESSOR) injection 5 mg   hydrALAZINE (APRESOLINE) injection 10 mg   sodium chloride flush (NS) 0.9 % injection 3 mL   sodium chloride flush (NS) 0.9 % injection 3 mL   traMADol (ULTRAM) tablet 50 mg   traMADol (ULTRAM) tablet 25 mg   morphine (PF) 2 MG/ML injection 1-2 mg   melatonin tablet 3 mg   levETIRAcetam (KEPPRA) tablet 500 mg    -  I have reviewed the patients home medicines and have made adjustments as needed   Consultations Obtained: I requested consultation with the Dr Danielle Dess, IMTS, trauma service,  and discussed lab and imaging findings as well as pertinent plan - they recommend: no acute NSGY intervention, IMTS will see in consult, trauma to admit   Cardiac Monitoring: The patient was maintained on a cardiac monitor.  I personally viewed and interpreted the cardiac monitored which showed an underlying rhythm of:  NSR  Social Determinants of Health:  Diagnosis or treatment significantly limited by social determinants of health: former smoker lives at home   Reevaluation: After the interventions noted above, I reevaluated the patient and found that they have improved  Co morbidities that complicate the patient evaluation  Past Medical History:  Diagnosis Date   History of bunionectomy 09/02/2020   Hyperlipidemia    Hyperlipidemia    left breast ILC 09/15/21   Osteoporosis    Personal history of chemotherapy    Personal history of radiation therapy    Rosacea    to face      Dispostion: Disposition decision including need for hospitalization was considered, and patient admitted to the hospital.    Final Clinical Impression(s) / ED Diagnoses Final diagnoses:  Syncope, unspecified syncope type  Injury of head, initial encounter  Fall in home, initial encounter  SDH (subdural hematoma) (HCC)  SAH (subarachnoid hemorrhage) (HCC)  Closed fracture of skull, unspecified bone, initial encounter (HCC)        Sloan Leiter, DO 04/22/23 1651

## 2023-04-22 NOTE — Consult Note (Signed)
Date: 04/22/2023               Patient Name:  Katherine Donovan MRN: [id number]  DOB: 1946-04-26 Age / Sex: 77 y.o., female   PCP: [name], MD         Requesting Physician: Dr. Verlene Mayer, Trauma, MD    Consulting Reason:  Syncope     Chief Complaint: syncope, recent fall with LOC  History of Present Illness: [name] is a 77 year old female presenting following a fall with LOC and is admitted to the trauma service with subdural hematomas and a small subarachnoid hemorrhage,  in addition to several skull base fractures. The Internal Medicine Teaching Service was consulted for syncope workup.  Husband was bedside and contributed to the history.  Patient states that she was in her kitchen this morning cooking ribs.  She went over to the fridge, and suddenly passed out, possibly hitting her nose on her fridge and then falling backwards, striking the back of her head on the ground.  Her husband witnessed her fall.  She does not have any memory of the fall and seems to have lost consciousness.  She remembers going to the fridge and then remembers waking up on the ground. No incontinence, tongue biting, or rhythmic movements, or post-fall confusion occurred. Immediately after regaining consciousness, she noted some epistaxis and was throwing up blood. She notes 5 episodes of hematemesis since her fall, but states they were low volume with a small amount of blood. She does endorse feeling warm and flushed prior to her syncopal episode.    She denies any lightheadedness, dizziness, diaphoresis, palpitations, or headaches.  She denies any recent medication changes. She is only taking tamoxifen for breast cancer s/p chemotherapy, which she has been taking since last November, in addition to vitamins. She states her diet and fluid intake was normal leading up to her fall.  She slept well the preceding night.  She denies any recent illnesses or sick contacts. She states she had a similar episode 30  years ago in a hot car during which she lost consciousness for 5 seconds, but denies any other episodes of syncope.  Meds: -Tamoxifen 20 mg daily (since 05/2022) -Probiotics -MVI -Vitamin D  Current Facility-Administered Medications  Medication Dose Route Frequency Provider Last Rate Last Admin   acetaminophen (TYLENOL) tablet 1,000 mg  1,000 mg Oral Q6H Barnetta Chapel, PA-C       docusate sodium (COLACE) capsule 100 mg  100 mg Oral BID Barnetta Chapel, PA-C       hydrALAZINE (APRESOLINE) injection 10 mg  10 mg Intravenous Q2H PRN Barnetta Chapel, PA-C       levETIRAcetam (KEPPRA) tablet 500 mg  500 mg Oral BID Barnetta Chapel, PA-C       melatonin tablet 3 mg  3 mg Oral QHS PRN Barnetta Chapel, PA-C       methocarbamol (ROBAXIN) tablet 500 mg  500 mg Oral Q8H Barnetta Chapel, PA-C       Or   methocarbamol (ROBAXIN) injection 500 mg  500 mg Intravenous Q8H Barnetta Chapel, PA-C       metoprolol tartrate (LOPRESSOR) injection 5 mg  5 mg Intravenous Q6H PRN Barnetta Chapel, PA-C       morphine (PF) 2 MG/ML injection 1-2 mg  1-2 mg Intravenous Q3H PRN Barnetta Chapel, PA-C       ondansetron (ZOFRAN-ODT) disintegrating tablet 4 mg  4 mg Oral Q6H PRN Barnetta Chapel, PA-C  Or   ondansetron (ZOFRAN) injection 4 mg  4 mg Intravenous Q6H PRN Barnetta Chapel, PA-C       polyethylene glycol (MIRALAX / GLYCOLAX) packet 17 g  17 g Oral Daily PRN Barnetta Chapel, PA-C       sodium chloride flush (NS) 0.9 % injection 3 mL  3 mL Intravenous Q12H Barnetta Chapel, PA-C       sodium chloride flush (NS) 0.9 % injection 3 mL  3 mL Intravenous PRN Barnetta Chapel, PA-C       tamoxifen (NOLVADEX) tablet 20 mg  20 mg Oral Daily Barnetta Chapel, PA-C       traMADol Janean Sark) tablet 25 mg  25 mg Oral Q6H PRN Barnetta Chapel, PA-C       traMADol Janean Sark) tablet 50 mg  50 mg Oral Q6H PRN Barnetta Chapel, PA-C       Current Outpatient Medications  Medication Sig Dispense Refill   Calcium Carbonate-Vitamin D 600-400 MG-UNIT  tablet Take 1 tablet by mouth daily.       Lactobacillus Rhamnosus, GG, (RA PROBIOTIC DIGESTIVE CARE) CAPS Take 1 capsule by mouth 3 (three) times a week.     Misc Natural Products (JOINT SUPPORT COMPLEX PO) Take 1 tablet by mouth daily. [place] Joints-Cartilage-Bone Triple Action Joint Health; Undenatured Type II Collagen     Multiple Vitamin (MULTI-VITAMIN) tablet Take 1 tablet by mouth daily.     tamoxifen (NOLVADEX) 20 MG tablet Take 1 tablet (20 mg total) by mouth daily. 90 tablet 3   Glucosamine 750 MG TABS Take 750 mg by mouth daily.   (Patient not taking: Reported on 04/22/2023)     Allergies: Allergies as of 04/22/2023   (No Known Allergies)   Past Medical History:  Diagnosis Date   History of bunionectomy 09/02/2020   Hyperlipidemia    Hyperlipidemia    left breast ILC 09/15/21   Osteoporosis    Personal history of chemotherapy    Personal history of radiation therapy    Rosacea    to face   Past Surgical History:  Procedure Laterality Date   BREAST LUMPECTOMY Left 2023   BREAST LUMPECTOMY WITH RADIOACTIVE SEED AND SENTINEL LYMPH NODE BIOPSY Left 02/04/2022   Procedure: LEFT BREAST SEED GUIDED LUMPECTOMY AND LEFT AXILLARY SENTINEL NODE BIOPSY;  Surgeon: Emelia Loron, MD;  Location: [facility];  Service: General;  Laterality: Left;  LMA & PEC BLOCK   PORTACATH PLACEMENT Right 10/06/2021   Procedure: INSERTION PORT-A-CATH;  Surgeon: Emelia Loron, MD;  Location: [facility];  Service: General;  Laterality: Right;   SIGMOIDOSCOPY  07/05/1997   TUBAL LIGATION     Family History  Problem Relation Age of Onset   Melanoma Mother        or other skin cancer; dx after 50; no systemic therapy needed   Hypertension Father    Heart attack Father    Other Sister 45       benign breast mass   Obesity Sister    Other Sister 46       benign breast mass   Cancer Paternal Uncle        unknown type; mets; d. late 49s   Colon cancer Neg Hx     Social History   Socioeconomic History   Marital status: Married    Spouse name: Not on file   Number of children: 0   Years of education: Not on file   Highest education level: Not on file  Occupational  History   Not on file  Tobacco Use   Smoking status: Former    Types: Cigarettes   Smokeless tobacco: Never   Tobacco comments:    Never really liked or began a habit.  Vaping Use   Vaping status: Never Used  Substance and Sexual Activity   Alcohol use: Yes    Alcohol/week: 3.0 - 5.0 standard drinks of alcohol    Types: 3 - 5 Glasses of wine per week    Comment: wine social   Drug use: No   Sexual activity: Yes    Birth control/protection: None, Post-menopausal    Comment: Age/tubes tied  Other Topics Concern   Not on file  Social History Narrative   Not on file   Social Determinants of Health   Financial Resource Strain: Low Risk  (09/23/2021)   Overall Financial Resource Strain (CARDIA)    Difficulty of Paying Living Expenses: Not hard at all  Food Insecurity: No Food Insecurity (04/22/2023)   Hunger Vital Sign    Worried About Running Out of Food in the Last Year: Never true    Ran Out of Food in the Last Year: Never true  Transportation Needs: No Transportation Needs (04/22/2023)   PRAPARE - Administrator, Civil Service (Medical): No    Lack of Transportation (Non-Medical): No  Physical Activity: Insufficiently Active (03/26/2021)   Received from [facility] visits prior to 09/04/2022., [facility] visits prior to 09/04/2022.   Exercise Vital Sign    Days of Exercise per Week: 3 days    Minutes of Exercise per Session: 20 min  Stress: No Stress Concern Present (03/26/2021)   Received from [facility] visits prior to 09/04/2022., [facility] visits prior to 09/04/2022.   [employer] of Occupational Health - Occupational Stress Questionnaire    Feeling of Stress :  Only a little  Social Connections: Unknown (03/26/2021)   Received from [facility] visits prior to 09/04/2022., [name] visits prior to 09/04/2022.   Social Advertising account executive [NHANES]    Frequency of Communication with Friends and Family: Patient refused    Frequency of Social Gatherings with Friends and Family: Patient refused    Attends Religious Services: Patient refused    Active Member of Clubs or Organizations: Patient refused    Attends Banker Meetings: Patient refused    Marital Status: Married  Catering manager Violence: Not At Risk (04/22/2023)   Humiliation, Afraid, Rape, and Kick questionnaire    Fear of Current or Ex-Partner: No    Emotionally Abused: No    Physically Abused: No    Sexually Abused: No    Review of Systems: Pertinent items noted in HPI and remainder of comprehensive ROS otherwise negative.  Physical Exam: Blood pressure 137/69, pulse 84, temperature 97.8 F (36.6 C), temperature source Oral, resp. rate 20, SpO2 95%.  General: alert, laying in bed comfortably, no acute distress Nose: dried blood appreciated in nares bilaterally Cardiac: RRR, normal S1/S2, no m/r/g; no LE edema Pulmonary: lungs CTA bilaterally, regular rate and effort Abdomen: soft, non-tender, non-distended Neuro: no CN deficits noted; finger to nose normal; no focal deficits appreciated on exam  Lab results: CBC    Component Value Date/Time   WBC 15.6 (H) 04/22/2023 0937   RBC 5.13 (H) 04/22/2023 0937   HGB 16.1 (H) 04/22/2023 0937   HGB 15.6 (H) 12/20/2022  1010   HCT 48.7 (H) 04/22/2023 0937   PLT 198 04/22/2023 0937   PLT 225 12/20/2022 1010   MCV 94.9 04/22/2023 0937   MCH 31.4 04/22/2023 0937   MCHC 33.1 04/22/2023 0937   RDW 13.1 04/22/2023 0937   LYMPHSABS 0.6 (L) 04/22/2023 0937   MONOABS 0.6 04/22/2023 0937   EOSABS 0.0 04/22/2023 0937   BASOSABS 0.0 04/22/2023 0937      Latest Ref Rng &  Units 04/22/2023    9:37 AM 12/20/2022   10:10 AM 09/30/2022    7:43 AM  BMP  Glucose 70 - 99 mg/dL 161  93  85   BUN 8 - 23 mg/dL 16  18  18    Creatinine 0.44 - 1.00 mg/dL 0.96  0.45  4.09   Sodium 135 - 145 mmol/L 135  138  140   Potassium 3.5 - 5.1 mmol/L 4.3  4.4  4.4   Chloride 98 - 111 mmol/L 101  106  107   CO2 22 - 32 mmol/L 21  25  27    Calcium 8.9 - 10.3 mg/dL 9.3  81.1  9.8      Lactic: 2.6 --> 1.9  Imaging results:  CT ANGIO HEAD NECK W WO CM  Result Date: 04/22/2023 CLINICAL DATA:  Skull fracture, assess for arterial injury. EXAM: CT ANGIOGRAPHY HEAD AND NECK WITH AND WITHOUT CONTRAST TECHNIQUE: Multidetector CT imaging of the head and neck was performed using the standard protocol during bolus administration of intravenous contrast. Multiplanar CT image reconstructions and MIPs were obtained to evaluate the vascular anatomy. Carotid stenosis measurements (when applicable) are obtained utilizing NASCET criteria, using the distal internal carotid diameter as the denominator. RADIATION DOSE REDUCTION: This exam was performed according to the departmental dose-optimization program which includes automated exposure control, adjustment of the mA and/or kV according to patient size and/or use of iterative reconstruction technique. CONTRAST:  75mL OMNIPAQUE IOHEXOL 350 MG/ML SOLN COMPARISON:  Same-day noncontrast CT head FINDINGS: CT HEAD FINDINGS Brain: 4 mm thick bilateral subdural hematomas are unchanged in size. There is unchanged small volume subarachnoid hemorrhage overlying the right anterior frontal lobe. There is no new acute intracranial hemorrhage or acute territorial infarct. There is no mass effect or midline shift. The ventricles are stable in size, without intraventricular hemorrhage. Scattered pneumocephalus is again seen. Vascular: See below. Skull: Calvarial and skull base fractures are again seen, described in detail on the CT head and facial bones from earlier today.  Sinuses/Orbits: Bilateral sphenoid hemosinus is again seen. The globes and orbits are unremarkable. Other: The mastoid air cells and middle ear cavities are clear. Review of the MIP images confirms the above findings CTA NECK FINDINGS Aortic arch: There is calcified plaque in the imaged aortic arch. The origins of the brachiocephalic and left common carotid arteries are not included within the field of view. The origin of the left subclavian artery is patent. The subclavian arteries are patent to the level imaged. Right carotid system: The right common, internal, and external carotid arteries are patent, with minimal plaque at the bifurcation but no hemodynamically significant stenosis or occlusion. There is no evidence of traumatic vascular injury. There is no dissection or aneurysm. Left carotid system: The left common, internal, and external carotid arteries are patent, without significant stenosis or occlusion. There is no evidence of traumatic vascular injury. There is no dissection or aneurysm. Vertebral arteries: The vertebral arteries are patent, without significant stenosis or occlusion there is no evidence of traumatic vascular injury.  There is no evidence of dissection or aneurysm. Skeleton: The cervical spine is assessed on the same day cervical spine CT. Other neck: The soft tissues of the neck are unremarkable. Upper chest: The imaged lung apices are clear. Review of the MIP images confirms the above findings CTA HEAD FINDINGS Anterior circulation: There is mild calcified plaque in the intracranial ICAs without significant stenosis or occlusion. There is no evidence of traumatic injury to the intracranial carotid arteries related to the skull base fractures. The bilateral MCAs and ACAS are patent, without proximal stenosis or occlusion. There is no aneurysm or AVM. Posterior circulation: The bilateral V4 segments are patent, diminutive on the right after the PICA origin which is favored congenital. The  basilar artery is patent. The major cerebellar arteries appear patent. The bilateral PCAs are patent, without proximal stenosis or occlusion. Posterior communicating arteries are not identified. There is no aneurysm or AVM. Venous sinuses: There is no definite evidence of venous sinus thrombosis. Anatomic variants: None. Review of the MIP images confirms the above findings IMPRESSION: 1. Patent vasculature of the head and neck without evidence of traumatic injury. 2. Unchanged 4 mm thick bilateral subdural hematomas and small volume right frontal subarachnoid hemorrhage. 3. Calvarial and skull base fractures described on CT head and facial bones from earlier the same day. Electronically Signed   By: Lesia Hausen M.D.   On: 04/22/2023 13:33   DG Chest Portable 1 View  Result Date: 04/22/2023 CLINICAL DATA:  Fall. EXAM: PORTABLE CHEST 1 VIEW COMPARISON:  None Available. FINDINGS: The heart size and mediastinal contours are within normal limits. Aortic calcification. Both lungs are clear. No pneumothorax or pleural effusion. Surgical clips project over the left chest wall. No acute osseous abnormality. IMPRESSION: No acute findings chest. Electronically Signed   By: Hart Robinsons M.D.   On: 04/22/2023 11:30   CT Head Wo Contrast  Result Date: 04/22/2023 CLINICAL DATA:  Trauma, pain EXAM: CT HEAD WITHOUT CONTRAST CT MAXILLOFACIAL WITHOUT CONTRAST CT CERVICAL SPINE WITHOUT CONTRAST TECHNIQUE: Multidetector CT imaging of the head, cervical spine, and maxillofacial structures were performed using the standard protocol without intravenous contrast. Multiplanar CT image reconstructions of the cervical spine and maxillofacial structures were also generated. RADIATION DOSE REDUCTION: This exam was performed according to the departmental dose-optimization program which includes automated exposure control, adjustment of the mA and/or kV according to patient size and/or use of iterative reconstruction technique.  COMPARISON:  None Available. FINDINGS: CT HEAD FINDINGS Brain: There is an acute subdural hematoma overlying the left cerebral convexity measuring up to 4 mm in maximal thickness overlying the parietal lobe (5-19). There is additional acute subdural and small volume subarachnoid blood overlying the right frontal lobe measuring up to 4 mm in thickness (3-12). There is no mass effect on the underlying brain parenchyma or midline shift. There is no evidence of acute territorial infarct. There is scattered pneumocephalus related to the below described calvarial fractures. Parenchymal volume is normal. The ventricles are normal in size, without evidence of intraventricular hemorrhage. Gray-white differentiation is preserved. The pituitary and suprasellar region are normal. There is no solid mass lesion. Vascular: There is calcification of the bilateral carotid siphons. Skull: There is an acute fracture of the occipital bone at the midline extending to the foramen magnum. There is an acute fracture of the right occipital condyle (7-28 of the facial bone sequence. There is an acute fracture of the right petrous apex extending to the posterior wall of the carotid canal (4-53  of the facial bone images). There is probably an occult fracture of the wall of the right sphenoid sinus given pneumocephalus and hemosinus. Other: The mastoid air cells are clear without evidence of temporal bone fracture. CT MAXILLOFACIAL FINDINGS Osseous: No other acute facial bone fracture is seen. The pterygoid plates are intact. There is no evidence of mandibular dislocation. Orbits: The globes and orbits are intact. There is no retrobulbar hematoma. Sinuses: There is a right more than left sphenoid hemosinus. 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 Soft tissues: Unremarkable. CT CERVICAL SPINE FINDINGS Alignment: Normal.  There is no evidence of traumatic malalignment. Skull base and vertebrae: Skull base fractures are detailed above. Skull  base alignment is maintained. There is age-indeterminate mild compression deformity of the superior T2 endplate (9-32). The other vertebral body heights are preserved. There is no other evidence of acute fracture. There is no suspicious osseous lesion. Soft tissues and spinal canal: No prevertebral fluid or swelling. No visible canal hematoma. Disc levels: There is mild disc space narrowing and degenerative endplate change at C4-C5 and C5-C6. There is no high-grade spinal canal stenosis. Upper chest: The imaged lung apices are clear. Other: None. IMPRESSION: CT HEAD: 1. Acute subdural hematomas overlying both cerebral convexities measuring up to approximately 4 mm in thickness bilaterally, and small volume subarachnoid hemorrhage overlying the right frontal lobe. No significant mass effect or midline shift. 2. Acute nondisplaced fracture of the occipital bone at the midline extending to the foramen magnum and acute nondisplaced fracture of the right occipital condyle. 3. Acute fracture of the right petrous apex extending to the posterior wall of the carotid canal. Recommend CTA of the head to evaluate for vascular injury. 4. Probable occult fracture of the wall of the right sphenoid sinus given pneumocephalus and hemosinus. 5. No other acute facial bone fracture. 6. Age-indeterminate mild compression deformity of the superior T2 endplate. 7. Otherwise, no acute fracture or traumatic malalignment of the cervical spine. Critical Value/emergent results were called by telephone at the time of interpretation on 04/22/2023 at 11:20 am to provider Tanda Rockers , who verbally acknowledged these results. Electronically Signed   By: Lesia Hausen M.D.   On: 04/22/2023 11:22   CT Cervical Spine Wo Contrast  Result Date: 04/22/2023 CLINICAL DATA:  Trauma, pain EXAM: CT HEAD WITHOUT CONTRAST CT MAXILLOFACIAL WITHOUT CONTRAST CT CERVICAL SPINE WITHOUT CONTRAST TECHNIQUE: Multidetector CT imaging of the head, cervical spine,  and maxillofacial structures were performed using the standard protocol without intravenous contrast. Multiplanar CT image reconstructions of the cervical spine and maxillofacial structures were also generated. RADIATION DOSE REDUCTION: This exam was performed according to the departmental dose-optimization program which includes automated exposure control, adjustment of the mA and/or kV according to patient size and/or use of iterative reconstruction technique. COMPARISON:  None Available. FINDINGS: CT HEAD FINDINGS Brain: There is an acute subdural hematoma overlying the left cerebral convexity measuring up to 4 mm in maximal thickness overlying the parietal lobe (5-19). There is additional acute subdural and small volume subarachnoid blood overlying the right frontal lobe measuring up to 4 mm in thickness (3-12). There is no mass effect on the underlying brain parenchyma or midline shift. There is no evidence of acute territorial infarct. There is scattered pneumocephalus related to the below described calvarial fractures. Parenchymal volume is normal. The ventricles are normal in size, without evidence of intraventricular hemorrhage. Gray-white differentiation is preserved. The pituitary and suprasellar region are normal. There is no solid mass lesion. Vascular: There is calcification  of the bilateral carotid siphons. Skull: There is an acute fracture of the occipital bone at the midline extending to the foramen magnum. There is an acute fracture of the right occipital condyle (7-28 of the facial bone sequence. There is an acute fracture of the right petrous apex extending to the posterior wall of the carotid canal (4-53 of the facial bone images). There is probably an occult fracture of the wall of the right sphenoid sinus given pneumocephalus and hemosinus. Other: The mastoid air cells are clear without evidence of temporal bone fracture. CT MAXILLOFACIAL FINDINGS Osseous: No other acute facial bone fracture  is seen. The pterygoid plates are intact. There is no evidence of mandibular dislocation. Orbits: The globes and orbits are intact. There is no retrobulbar hematoma. Sinuses: There is a right more than left sphenoid hemosinus. 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 Soft tissues: Unremarkable. CT CERVICAL SPINE FINDINGS Alignment: Normal.  There is no evidence of traumatic malalignment. Skull base and vertebrae: Skull base fractures are detailed above. Skull base alignment is maintained. There is age-indeterminate mild compression deformity of the superior T2 endplate (9-32). The other vertebral body heights are preserved. There is no other evidence of acute fracture. There is no suspicious osseous lesion. Soft tissues and spinal canal: No prevertebral fluid or swelling. No visible canal hematoma. Disc levels: There is mild disc space narrowing and degenerative endplate change at C4-C5 and C5-C6. There is no high-grade spinal canal stenosis. Upper chest: The imaged lung apices are clear. Other: None. IMPRESSION: CT HEAD: 1. Acute subdural hematomas overlying both cerebral convexities measuring up to approximately 4 mm in thickness bilaterally, and small volume subarachnoid hemorrhage overlying the right frontal lobe. No significant mass effect or midline shift. 2. Acute nondisplaced fracture of the occipital bone at the midline extending to the foramen magnum and acute nondisplaced fracture of the right occipital condyle. 3. Acute fracture of the right petrous apex extending to the posterior wall of the carotid canal. Recommend CTA of the head to evaluate for vascular injury. 4. Probable occult fracture of the wall of the right sphenoid sinus given pneumocephalus and hemosinus. 5. No other acute facial bone fracture. 6. Age-indeterminate mild compression deformity of the superior T2 endplate. 7. Otherwise, no acute fracture or traumatic malalignment of the cervical spine. Critical Value/emergent results were  called by telephone at the time of interpretation on 04/22/2023 at 11:20 am to provider Tanda Rockers , who verbally acknowledged these results. Electronically Signed   By: Lesia Hausen M.D.   On: 04/22/2023 11:22   CT Maxillofacial Wo Contrast  Result Date: 04/22/2023 CLINICAL DATA:  Trauma, pain EXAM: CT HEAD WITHOUT CONTRAST CT MAXILLOFACIAL WITHOUT CONTRAST CT CERVICAL SPINE WITHOUT CONTRAST TECHNIQUE: Multidetector CT imaging of the head, cervical spine, and maxillofacial structures were performed using the standard protocol without intravenous contrast. Multiplanar CT image reconstructions of the cervical spine and maxillofacial structures were also generated. RADIATION DOSE REDUCTION: This exam was performed according to the departmental dose-optimization program which includes automated exposure control, adjustment of the mA and/or kV according to patient size and/or use of iterative reconstruction technique. COMPARISON:  None Available. FINDINGS: CT HEAD FINDINGS Brain: There is an acute subdural hematoma overlying the left cerebral convexity measuring up to 4 mm in maximal thickness overlying the parietal lobe (5-19). There is additional acute subdural and small volume subarachnoid blood overlying the right frontal lobe measuring up to 4 mm in thickness (3-12). There is no mass effect on the underlying brain parenchyma  or midline shift. There is no evidence of acute territorial infarct. There is scattered pneumocephalus related to the below described calvarial fractures. Parenchymal volume is normal. The ventricles are normal in size, without evidence of intraventricular hemorrhage. Gray-white differentiation is preserved. The pituitary and suprasellar region are normal. There is no solid mass lesion. Vascular: There is calcification of the bilateral carotid siphons. Skull: There is an acute fracture of the occipital bone at the midline extending to the foramen magnum. There is an acute fracture of the  right occipital condyle (7-28 of the facial bone sequence. There is an acute fracture of the right petrous apex extending to the posterior wall of the carotid canal (4-53 of the facial bone images). There is probably an occult fracture of the wall of the right sphenoid sinus given pneumocephalus and hemosinus. Other: The mastoid air cells are clear without evidence of temporal bone fracture. CT MAXILLOFACIAL FINDINGS Osseous: No other acute facial bone fracture is seen. The pterygoid plates are intact. There is no evidence of mandibular dislocation. Orbits: The globes and orbits are intact. There is no retrobulbar hematoma. Sinuses: There is a right more than left sphenoid hemosinus. 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 Soft tissues: Unremarkable. CT CERVICAL SPINE FINDINGS Alignment: Normal.  There is no evidence of traumatic malalignment. Skull base and vertebrae: Skull base fractures are detailed above. Skull base alignment is maintained. There is age-indeterminate mild compression deformity of the superior T2 endplate (9-32). The other vertebral body heights are preserved. There is no other evidence of acute fracture. There is no suspicious osseous lesion. Soft tissues and spinal canal: No prevertebral fluid or swelling. No visible canal hematoma. Disc levels: There is mild disc space narrowing and degenerative endplate change at C4-C5 and C5-C6. There is no high-grade spinal canal stenosis. Upper chest: The imaged lung apices are clear. Other: None. IMPRESSION: CT HEAD: 1. Acute subdural hematomas overlying both cerebral convexities measuring up to approximately 4 mm in thickness bilaterally, and small volume subarachnoid hemorrhage overlying the right frontal lobe. No significant mass effect or midline shift. 2. Acute nondisplaced fracture of the occipital bone at the midline extending to the foramen magnum and acute nondisplaced fracture of the right occipital condyle. 3. Acute fracture of the right  petrous apex extending to the posterior wall of the carotid canal. Recommend CTA of the head to evaluate for vascular injury. 4. Probable occult fracture of the wall of the right sphenoid sinus given pneumocephalus and hemosinus. 5. No other acute facial bone fracture. 6. Age-indeterminate mild compression deformity of the superior T2 endplate. 7. Otherwise, no acute fracture or traumatic malalignment of the cervical spine. Critical Value/emergent results were called by telephone at the time of interpretation on 04/22/2023 at 11:20 am to provider Tanda Rockers , who verbally acknowledged these results. Electronically Signed   By: Lesia Hausen M.D.   On: 04/22/2023 11:22    Other results: EKG: normal EKG, normal sinus rhythm, unchanged from previous tracings, normal sinus rhythm.  Assessment, Plan, & Recommendations by Problem: Principal Problem:   Syncope  Syncope, likely vasovagal Based on clinical history with warmth and flush sensation preceding syncopal episode, most likely vasovagal syncope. Will do a syncope workup to exclude other etiologies. No new medications, recent illnesses, history of arrhythmias or heart conditions, history of seizures. EKG with normal sinus rhythm, no ischemic changes or heart blocks. Troponin wnl. Prior echo in April 2024 showed EF 65-70% with no significant dysfunction or valvular abnormalities. WBC elevated 15.6, Lactic 2.6 -->  1.9, possibly traumatic. Electrolytes wnl. Will follow until completion of syncope workup.  Plan: -Cardiac telemetry -Echocardiogram -Check Orthostatics -morning BMP, CBC  Subdural hematomas, subarachnoid hemorrhage, nondisplaced occipital bone fracture, nondisplaced right occipital condyle fracture, right petrous apex fracture, probable fracture of right sphenoid sinus- Defer to surgery team for management. History of breast cancer s/p chemotherapy, radiation therapy, lumpectomy- Continue tamoxifen. Pain management- per surgery  team. Osteoporosis- prior DEXA in 09/2022 with T score -3.6. Prior compression fracture in July 2019. Previously on Fosamax for ~10 years, stopped recently when tamoxifen was started. Currently taking vitamin D and calcium. Consider restarting a bisphosphonate on discharge.   Thank you for this consult. We will continue following.   Signed: Annett Fabian, MD 04/22/2023, 4:50 PM

## 2023-04-22 NOTE — ED Triage Notes (Signed)
Pt reports standing at the refrigerator and "waking up on the floor." Per EMS husband witnessed the event and reported pt passed out for 10 seconds. Pt reports headache and nausea at this time. No neuro deficits.

## 2023-04-22 NOTE — ED Notes (Signed)
Aspen collar placed on patient

## 2023-04-22 NOTE — ED Notes (Signed)
Trauma MD at bedside.

## 2023-04-22 NOTE — H&P (Signed)
Modelle Mcarthur [facility] 04/18/1946  [id number].    Requesting MD: Dr. Wallace Cullens Chief Complaint/Reason for Consult: Fall  HPI:  77 y.o. female who presents to [facility] ED as Level 2 trauma via EMS from home where she lives with her husband. She was walking to the refrigerator at home when she abruptly fell to the ground striking her head on the refrigerator and then floor. Husband who accompanies her states she had a vacant look on her face prior to falling. She remembers walking towards the fridge then waking up on the floor.   She complains of a headache. She had epistaxis initially as well as hematemesis and abdominal pain from swallowing blood. Last episode of hematemesis was here in the ED. No further nausea or abdominal pain currently. She denies neck pain. She denies pain or injury to any extremities. She denies vision changes. She had a similar episode of syncope 30 years ago with negative medical work up. Leading up to incident today she states she has felt to be in her normal state of health without any dizziness or symptoms of illness.  Past medical history otherwise significant for breast cancer, hyperlipidemia.   Substance use: none Allergies: nkda Blood thinners: none Past Surgeries: laparoscopic tubal ligation, breast lumpectomy   ROS: Reviewed and as above  Family History  Problem Relation Age of Onset   Melanoma Mother        or other skin cancer; dx after 18; no systemic therapy needed   Hypertension Father    Heart attack Father    Other Sister 26       benign breast mass   Obesity Sister    Other Sister 61       benign breast mass   Cancer Paternal Uncle        unknown type; mets; d. late 60s   Colon cancer Neg Hx     Past Medical History:  Diagnosis Date   History of bunionectomy 09/02/2020   Hyperlipidemia    Hyperlipidemia    left breast ILC 09/15/21   Osteoporosis    Personal history of chemotherapy    Personal history of radiation therapy    Rosacea     to face    Past Surgical History:  Procedure Laterality Date   BREAST LUMPECTOMY Left 2023   BREAST LUMPECTOMY WITH RADIOACTIVE SEED AND SENTINEL LYMPH NODE BIOPSY Left 02/04/2022   Procedure: LEFT BREAST SEED GUIDED LUMPECTOMY AND LEFT AXILLARY SENTINEL NODE BIOPSY;  Surgeon: Emelia Loron, MD;  Location: [facility];  Service: General;  Laterality: Left;  LMA & PEC BLOCK   PORTACATH PLACEMENT Right 10/06/2021   Procedure: INSERTION PORT-A-CATH;  Surgeon: Emelia Loron, MD;  Location: [facility];  Service: General;  Laterality: Right;   SIGMOIDOSCOPY  07/05/1997   TUBAL LIGATION      Social History:  reports that she has quit smoking. Her smoking use included cigarettes. She has never used smokeless tobacco. She reports current alcohol use of about 3.0 - 5.0 standard drinks of alcohol per week. She reports that she does not use drugs.  Allergies: No Known Allergies  (Not in a hospital admission)   Blood pressure (!) 154/74, pulse 64, temperature 98 F (36.7 C), temperature source Axillary, resp. rate 16, SpO2 97%. Physical Exam: Physical Exam Constitutional:      General: She is not in acute distress.    Appearance: She is not ill-appearing, toxic-appearing or diaphoretic.  HENT:  Head: Normocephalic. No laceration.     Right Ear: External ear normal.     Left Ear: External ear normal.     Nose: Nose normal.     Mouth/Throat:     Mouth: Mucous membranes are moist.  Eyes:     Extraocular Movements: Extraocular movements intact.     Conjunctiva/sclera: Conjunctivae normal.     Right eye: Right conjunctiva is not injected.     Left eye: Left conjunctiva is not injected.     Pupils: Pupils are equal, round, and reactive to light.  Cardiovascular:     Rate and Rhythm: Normal rate and regular rhythm.     Pulses: Normal pulses.          Radial pulses are 2+ on the right side and 2+ on the left side.       Dorsalis pedis pulses are 2+  on the right side and 2+ on the left side.     Heart sounds: Normal heart sounds.  Pulmonary:     Effort: Pulmonary effort is normal. No respiratory distress.     Breath sounds: Normal breath sounds.  Abdominal:     General: There is no distension.     Palpations: Abdomen is soft. There is no mass.     Tenderness: There is no abdominal tenderness. There is no guarding or rebound.     Hernia: No hernia is present.  Musculoskeletal:     Cervical back: Full passive range of motion without pain and normal range of motion. No rigidity. No pain with movement or spinous process tenderness.     Right lower leg: No edema.     Left lower leg: No edema.  Skin:    General: Skin is warm and dry.  Neurological:     Mental Status: She is alert and oriented to person, place, and time.     GCS: GCS eye subscore is 4. GCS verbal subscore is 5. GCS motor subscore is 6.     Cranial Nerves: Cranial nerves 2-12 are intact.  Psychiatric:        Mood and Affect: Mood normal.        Behavior: Behavior normal. Behavior is cooperative.      Results for orders placed or performed during the hospital encounter of 04/22/23 (from the past 48 hour(s))  CBC with Differential     Status: Abnormal   Collection Time: 04/22/23  9:37 AM  Result Value Ref Range   WBC 15.6 (H) 4.0 - 10.5 K/uL   RBC 5.13 (H) 3.87 - 5.11 MIL/uL   Hemoglobin 16.1 (H) 12.0 - 15.0 g/dL   HCT 69.6 (H) 29.5 - 28.4 %   MCV 94.9 80.0 - 100.0 fL   MCH 31.4 26.0 - 34.0 pg   MCHC 33.1 30.0 - 36.0 g/dL   RDW 13.2 44.0 - 10.2 %   Platelets 198 150 - 400 K/uL   nRBC 0.0 0.0 - 0.2 %   Neutrophils Relative % 92 %   Neutro Abs 14.2 (H) 1.7 - 7.7 K/uL   Lymphocytes Relative 4 %   Lymphs Abs 0.6 (L) 0.7 - 4.0 K/uL   Monocytes Relative 4 %   Monocytes Absolute 0.6 0.1 - 1.0 K/uL   Eosinophils Relative 0 %   Eosinophils Absolute 0.0 0.0 - 0.5 K/uL   Basophils Relative 0 %   Basophils Absolute 0.0 0.0 - 0.1 K/uL   Immature Granulocytes 0 %    Abs Immature Granulocytes 0.06 0.00 - 0.07  K/uL    Comment: Performed at [facility], [address] [place], [place] [address]  Comprehensive metabolic panel     Status: Abnormal   Collection Time: 04/22/23  9:37 AM  Result Value Ref Range   Sodium 135 135 - 145 mmol/L   Potassium 4.3 3.5 - 5.1 mmol/L   Chloride 101 98 - 111 mmol/L   CO2 21 (L) 22 - 32 mmol/L   Glucose, Bld 155 (H) 70 - 99 mg/dL    Comment: Glucose reference range applies only to samples taken after fasting for at least 8 hours.   BUN 16 8 - 23 mg/dL   Creatinine, Ser 3.08 0.44 - 1.00 mg/dL   Calcium 9.3 8.9 - 65.7 mg/dL   Total Protein 6.3 (L) 6.5 - 8.1 g/dL   Albumin 3.8 3.5 - 5.0 g/dL   AST 41 15 - 41 U/L   ALT 21 0 - 44 U/L   Alkaline Phosphatase 33 (L) 38 - 126 U/L   Total Bilirubin 0.8 0.3 - 1.2 mg/dL   GFR, Estimated >84 >69 mL/min    Comment: (NOTE) Calculated using the CKD-EPI Creatinine Equation (2021)    Anion gap 13 5 - 15    Comment: Performed at [facility], [address], [place], [place] [address]  Troponin I (High Sensitivity)     Status: None   Collection Time: 04/22/23  9:37 AM  Result Value Ref Range   Troponin I (High Sensitivity) 4 <18 ng/L    Comment: (NOTE) Elevated high sensitivity troponin I (hsTnI) values and significant  changes across serial measurements may suggest ACS but many other  chronic and acute conditions are known to elevate hsTnI results.  Refer to the "Links" section for chest pain algorithms and additional  guidance. Performed at [facility], [address] [place], [place] [address]   I-Stat CG4 Lactic Acid     Status: Abnormal   Collection Time: 04/22/23  9:38 AM  Result Value Ref Range   Lactic Acid, Venous 2.6 (HH) 0.5 - 1.9 mmol/L   Comment NOTIFIED PHYSICIAN   I-Stat CG4 Lactic Acid     Status: None   Collection Time: 04/22/23  1:58 PM  Result Value Ref Range   Lactic Acid, Venous 1.9 0.5 - 1.9 mmol/L   CT ANGIO HEAD NECK W WO  CM  Result Date: 04/22/2023 CLINICAL DATA:  Skull fracture, assess for arterial injury. EXAM: CT ANGIOGRAPHY HEAD AND NECK WITH AND WITHOUT CONTRAST TECHNIQUE: Multidetector CT imaging of the head and neck was performed using the standard protocol during bolus administration of intravenous contrast. Multiplanar CT image reconstructions and MIPs were obtained to evaluate the vascular anatomy. Carotid stenosis measurements (when applicable) are obtained utilizing NASCET criteria, using the distal internal carotid diameter as the denominator. RADIATION DOSE REDUCTION: This exam was performed according to the departmental dose-optimization program which includes automated exposure control, adjustment of the mA and/or kV according to patient size and/or use of iterative reconstruction technique. CONTRAST:  75mL OMNIPAQUE IOHEXOL 350 MG/ML SOLN COMPARISON:  Same-day noncontrast CT head FINDINGS: CT HEAD FINDINGS Brain: 4 mm thick bilateral subdural hematomas are unchanged in size. There is unchanged small volume subarachnoid hemorrhage overlying the right anterior frontal lobe. There is no new acute intracranial hemorrhage or acute territorial infarct. There is no mass effect or midline shift. The ventricles are stable in size, without intraventricular hemorrhage. Scattered pneumocephalus is again seen. Vascular: See below. Skull: Calvarial and skull  base fractures are again seen, described in detail on the CT head and facial bones from earlier today. Sinuses/Orbits: Bilateral sphenoid hemosinus is again seen. The globes and orbits are unremarkable. Other: The mastoid air cells and middle ear cavities are clear. Review of the MIP images confirms the above findings CTA NECK FINDINGS Aortic arch: There is calcified plaque in the imaged aortic arch. The origins of the brachiocephalic and left common carotid arteries are not included within the field of view. The origin of the left subclavian artery is patent. The  subclavian arteries are patent to the level imaged. Right carotid system: The right common, internal, and external carotid arteries are patent, with minimal plaque at the bifurcation but no hemodynamically significant stenosis or occlusion. There is no evidence of traumatic vascular injury. There is no dissection or aneurysm. Left carotid system: The left common, internal, and external carotid arteries are patent, without significant stenosis or occlusion. There is no evidence of traumatic vascular injury. There is no dissection or aneurysm. Vertebral arteries: The vertebral arteries are patent, without significant stenosis or occlusion there is no evidence of traumatic vascular injury. There is no evidence of dissection or aneurysm. Skeleton: The cervical spine is assessed on the same day cervical spine CT. Other neck: The soft tissues of the neck are unremarkable. Upper chest: The imaged lung apices are clear. Review of the MIP images confirms the above findings CTA HEAD FINDINGS Anterior circulation: There is mild calcified plaque in the intracranial ICAs without significant stenosis or occlusion. There is no evidence of traumatic injury to the intracranial carotid arteries related to the skull base fractures. The bilateral MCAs and ACAS are patent, without proximal stenosis or occlusion. There is no aneurysm or AVM. Posterior circulation: The bilateral V4 segments are patent, diminutive on the right after the PICA origin which is favored congenital. The basilar artery is patent. The major cerebellar arteries appear patent. The bilateral PCAs are patent, without proximal stenosis or occlusion. Posterior communicating arteries are not identified. There is no aneurysm or AVM. Venous sinuses: There is no definite evidence of venous sinus thrombosis. Anatomic variants: None. Review of the MIP images confirms the above findings IMPRESSION: 1. Patent vasculature of the head and neck without evidence of traumatic  injury. 2. Unchanged 4 mm thick bilateral subdural hematomas and small volume right frontal subarachnoid hemorrhage. 3. Calvarial and skull base fractures described on CT head and facial bones from earlier the same day. Electronically Signed   By: Lesia Hausen M.D.   On: 04/22/2023 13:33   DG Chest Portable 1 View  Result Date: 04/22/2023 CLINICAL DATA:  Fall. EXAM: PORTABLE CHEST 1 VIEW COMPARISON:  None Available. FINDINGS: The heart size and mediastinal contours are within normal limits. Aortic calcification. Both lungs are clear. No pneumothorax or pleural effusion. Surgical clips project over the left chest wall. No acute osseous abnormality. IMPRESSION: No acute findings chest. Electronically Signed   By: Hart Robinsons M.D.   On: 04/22/2023 11:30   CT Head Wo Contrast  Result Date: 04/22/2023 CLINICAL DATA:  Trauma, pain EXAM: CT HEAD WITHOUT CONTRAST CT MAXILLOFACIAL WITHOUT CONTRAST CT CERVICAL SPINE WITHOUT CONTRAST TECHNIQUE: Multidetector CT imaging of the head, cervical spine, and maxillofacial structures were performed using the standard protocol without intravenous contrast. Multiplanar CT image reconstructions of the cervical spine and maxillofacial structures were also generated. RADIATION DOSE REDUCTION: This exam was performed according to the departmental dose-optimization program which includes automated exposure control, adjustment of the mA and/or kV  according to patient size and/or use of iterative reconstruction technique. COMPARISON:  None Available. FINDINGS: CT HEAD FINDINGS Brain: There is an acute subdural hematoma overlying the left cerebral convexity measuring up to 4 mm in maximal thickness overlying the parietal lobe (5-19). There is additional acute subdural and small volume subarachnoid blood overlying the right frontal lobe measuring up to 4 mm in thickness (3-12). There is no mass effect on the underlying brain parenchyma or midline shift. There is no evidence of  acute territorial infarct. There is scattered pneumocephalus related to the below described calvarial fractures. Parenchymal volume is normal. The ventricles are normal in size, without evidence of intraventricular hemorrhage. Gray-white differentiation is preserved. The pituitary and suprasellar region are normal. There is no solid mass lesion. Vascular: There is calcification of the bilateral carotid siphons. Skull: There is an acute fracture of the occipital bone at the midline extending to the foramen magnum. There is an acute fracture of the right occipital condyle (7-28 of the facial bone sequence. There is an acute fracture of the right petrous apex extending to the posterior wall of the carotid canal (4-53 of the facial bone images). There is probably an occult fracture of the wall of the right sphenoid sinus given pneumocephalus and hemosinus. Other: The mastoid air cells are clear without evidence of temporal bone fracture. CT MAXILLOFACIAL FINDINGS Osseous: No other acute facial bone fracture is seen. The pterygoid plates are intact. There is no evidence of mandibular dislocation. Orbits: The globes and orbits are intact. There is no retrobulbar hematoma. Sinuses: There is a right more than left sphenoid hemosinus. 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 Soft tissues: Unremarkable. CT CERVICAL SPINE FINDINGS Alignment: Normal.  There is no evidence of traumatic malalignment. Skull base and vertebrae: Skull base fractures are detailed above. Skull base alignment is maintained. There is age-indeterminate mild compression deformity of the superior T2 endplate (9-32). The other vertebral body heights are preserved. There is no other evidence of acute fracture. There is no suspicious osseous lesion. Soft tissues and spinal canal: No prevertebral fluid or swelling. No visible canal hematoma. Disc levels: There is mild disc space narrowing and degenerative endplate change at C4-C5 and C5-C6. There is no  high-grade spinal canal stenosis. Upper chest: The imaged lung apices are clear. Other: None. IMPRESSION: CT HEAD: 1. Acute subdural hematomas overlying both cerebral convexities measuring up to approximately 4 mm in thickness bilaterally, and small volume subarachnoid hemorrhage overlying the right frontal lobe. No significant mass effect or midline shift. 2. Acute nondisplaced fracture of the occipital bone at the midline extending to the foramen magnum and acute nondisplaced fracture of the right occipital condyle. 3. Acute fracture of the right petrous apex extending to the posterior wall of the carotid canal. Recommend CTA of the head to evaluate for vascular injury. 4. Probable occult fracture of the wall of the right sphenoid sinus given pneumocephalus and hemosinus. 5. No other acute facial bone fracture. 6. Age-indeterminate mild compression deformity of the superior T2 endplate. 7. Otherwise, no acute fracture or traumatic malalignment of the cervical spine. Critical Value/emergent results were called by telephone at the time of interpretation on 04/22/2023 at 11:20 am to provider Tanda Rockers , who verbally acknowledged these results. Electronically Signed   By: Lesia Hausen M.D.   On: 04/22/2023 11:22   CT Cervical Spine Wo Contrast  Result Date: 04/22/2023 CLINICAL DATA:  Trauma, pain EXAM: CT HEAD WITHOUT CONTRAST CT MAXILLOFACIAL WITHOUT CONTRAST CT CERVICAL SPINE WITHOUT CONTRAST  TECHNIQUE: Multidetector CT imaging of the head, cervical spine, and maxillofacial structures were performed using the standard protocol without intravenous contrast. Multiplanar CT image reconstructions of the cervical spine and maxillofacial structures were also generated. RADIATION DOSE REDUCTION: This exam was performed according to the departmental dose-optimization program which includes automated exposure control, adjustment of the mA and/or kV according to patient size and/or use of iterative reconstruction  technique. COMPARISON:  None Available. FINDINGS: CT HEAD FINDINGS Brain: There is an acute subdural hematoma overlying the left cerebral convexity measuring up to 4 mm in maximal thickness overlying the parietal lobe (5-19). There is additional acute subdural and small volume subarachnoid blood overlying the right frontal lobe measuring up to 4 mm in thickness (3-12). There is no mass effect on the underlying brain parenchyma or midline shift. There is no evidence of acute territorial infarct. There is scattered pneumocephalus related to the below described calvarial fractures. Parenchymal volume is normal. The ventricles are normal in size, without evidence of intraventricular hemorrhage. Gray-white differentiation is preserved. The pituitary and suprasellar region are normal. There is no solid mass lesion. Vascular: There is calcification of the bilateral carotid siphons. Skull: There is an acute fracture of the occipital bone at the midline extending to the foramen magnum. There is an acute fracture of the right occipital condyle (7-28 of the facial bone sequence. There is an acute fracture of the right petrous apex extending to the posterior wall of the carotid canal (4-53 of the facial bone images). There is probably an occult fracture of the wall of the right sphenoid sinus given pneumocephalus and hemosinus. Other: The mastoid air cells are clear without evidence of temporal bone fracture. CT MAXILLOFACIAL FINDINGS Osseous: No other acute facial bone fracture is seen. The pterygoid plates are intact. There is no evidence of mandibular dislocation. Orbits: The globes and orbits are intact. There is no retrobulbar hematoma. Sinuses: There is a right more than left sphenoid hemosinus. 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 Soft tissues: Unremarkable. CT CERVICAL SPINE FINDINGS Alignment: Normal.  There is no evidence of traumatic malalignment. Skull base and vertebrae: Skull base fractures are detailed  above. Skull base alignment is maintained. There is age-indeterminate mild compression deformity of the superior T2 endplate (9-32). The other vertebral body heights are preserved. There is no other evidence of acute fracture. There is no suspicious osseous lesion. Soft tissues and spinal canal: No prevertebral fluid or swelling. No visible canal hematoma. Disc levels: There is mild disc space narrowing and degenerative endplate change at C4-C5 and C5-C6. There is no high-grade spinal canal stenosis. Upper chest: The imaged lung apices are clear. Other: None. IMPRESSION: CT HEAD: 1. Acute subdural hematomas overlying both cerebral convexities measuring up to approximately 4 mm in thickness bilaterally, and small volume subarachnoid hemorrhage overlying the right frontal lobe. No significant mass effect or midline shift. 2. Acute nondisplaced fracture of the occipital bone at the midline extending to the foramen magnum and acute nondisplaced fracture of the right occipital condyle. 3. Acute fracture of the right petrous apex extending to the posterior wall of the carotid canal. Recommend CTA of the head to evaluate for vascular injury. 4. Probable occult fracture of the wall of the right sphenoid sinus given pneumocephalus and hemosinus. 5. No other acute facial bone fracture. 6. Age-indeterminate mild compression deformity of the superior T2 endplate. 7. Otherwise, no acute fracture or traumatic malalignment of the cervical spine. Critical Value/emergent results were called by telephone at the time of interpretation  on 04/22/2023 at 11:20 am to provider Tanda Rockers , who verbally acknowledged these results. Electronically Signed   By: Lesia Hausen M.D.   On: 04/22/2023 11:22   CT Maxillofacial Wo Contrast  Result Date: 04/22/2023 CLINICAL DATA:  Trauma, pain EXAM: CT HEAD WITHOUT CONTRAST CT MAXILLOFACIAL WITHOUT CONTRAST CT CERVICAL SPINE WITHOUT CONTRAST TECHNIQUE: Multidetector CT imaging of the head,  cervical spine, and maxillofacial structures were performed using the standard protocol without intravenous contrast. Multiplanar CT image reconstructions of the cervical spine and maxillofacial structures were also generated. RADIATION DOSE REDUCTION: This exam was performed according to the departmental dose-optimization program which includes automated exposure control, adjustment of the mA and/or kV according to patient size and/or use of iterative reconstruction technique. COMPARISON:  None Available. FINDINGS: CT HEAD FINDINGS Brain: There is an acute subdural hematoma overlying the left cerebral convexity measuring up to 4 mm in maximal thickness overlying the parietal lobe (5-19). There is additional acute subdural and small volume subarachnoid blood overlying the right frontal lobe measuring up to 4 mm in thickness (3-12). There is no mass effect on the underlying brain parenchyma or midline shift. There is no evidence of acute territorial infarct. There is scattered pneumocephalus related to the below described calvarial fractures. Parenchymal volume is normal. The ventricles are normal in size, without evidence of intraventricular hemorrhage. Gray-white differentiation is preserved. The pituitary and suprasellar region are normal. There is no solid mass lesion. Vascular: There is calcification of the bilateral carotid siphons. Skull: There is an acute fracture of the occipital bone at the midline extending to the foramen magnum. There is an acute fracture of the right occipital condyle (7-28 of the facial bone sequence. There is an acute fracture of the right petrous apex extending to the posterior wall of the carotid canal (4-53 of the facial bone images). There is probably an occult fracture of the wall of the right sphenoid sinus given pneumocephalus and hemosinus. Other: The mastoid air cells are clear without evidence of temporal bone fracture. CT MAXILLOFACIAL FINDINGS Osseous: No other acute facial  bone fracture is seen. The pterygoid plates are intact. There is no evidence of mandibular dislocation. Orbits: The globes and orbits are intact. There is no retrobulbar hematoma. Sinuses: There is a right more than left sphenoid hemosinus. 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 Soft tissues: Unremarkable. CT CERVICAL SPINE FINDINGS Alignment: Normal.  There is no evidence of traumatic malalignment. Skull base and vertebrae: Skull base fractures are detailed above. Skull base alignment is maintained. There is age-indeterminate mild compression deformity of the superior T2 endplate (9-32). The other vertebral body heights are preserved. There is no other evidence of acute fracture. There is no suspicious osseous lesion. Soft tissues and spinal canal: No prevertebral fluid or swelling. No visible canal hematoma. Disc levels: There is mild disc space narrowing and degenerative endplate change at C4-C5 and C5-C6. There is no high-grade spinal canal stenosis. Upper chest: The imaged lung apices are clear. Other: None. IMPRESSION: CT HEAD: 1. Acute subdural hematomas overlying both cerebral convexities measuring up to approximately 4 mm in thickness bilaterally, and small volume subarachnoid hemorrhage overlying the right frontal lobe. No significant mass effect or midline shift. 2. Acute nondisplaced fracture of the occipital bone at the midline extending to the foramen magnum and acute nondisplaced fracture of the right occipital condyle. 3. Acute fracture of the right petrous apex extending to the posterior wall of the carotid canal. Recommend CTA of the head to evaluate for vascular  injury. 4. Probable occult fracture of the wall of the right sphenoid sinus given pneumocephalus and hemosinus. 5. No other acute facial bone fracture. 6. Age-indeterminate mild compression deformity of the superior T2 endplate. 7. Otherwise, no acute fracture or traumatic malalignment of the cervical spine. Critical Value/emergent  results were called by telephone at the time of interpretation on 04/22/2023 at 11:20 am to provider Tanda Rockers , who verbally acknowledged these results. Electronically Signed   By: Lesia Hausen M.D.   On: 04/22/2023 11:22      Assessment/Plan GLF  Syncope - TRH consult for workup Skull fractures with SDH/SAH - NSGY Dr. Danielle Dess consulted. No surgical intervention recommended. Keppra x7 days for seizure ppx HLD - POA Leukocytosis - likely reactive in setting of trauma. Will trend. Lactic acidosis - likely reactive in setting of trauma. resolved  Admit to trauma service progressive.   FEN: Regular ID: none indicated VTE: LMWH on hold in setting up head injury   Eric Form, [facility] 04/22/2023, 2:10 PM Please see Amion for pager number during day hours 7:00am-4:30pm

## 2023-04-22 NOTE — Progress Notes (Signed)
Orthopedic Tech Progress Note Patient Details:  Katherine Donovan 1945-07-11 [id number] Level 2 Trauma. Downgraded to Non-trauma. Not needed Patient ID: NABRIA WALAS, female   DOB: January 18, 1946, 77 y.o.   MRN: [id number]  Lovett Calender 04/22/2023, 9:26 AM

## 2023-04-22 NOTE — ED Notes (Signed)
Pt's spouse Jillyn Hidden would like to be called on home number [phone] when pt has a bed upstairs.

## 2023-04-23 ENCOUNTER — Other Ambulatory Visit (HOSPITAL_COMMUNITY): Payer: Medicare Other

## 2023-04-23 ENCOUNTER — Inpatient Hospital Stay (HOSPITAL_COMMUNITY): Payer: Medicare Other

## 2023-04-23 DIAGNOSIS — R55 Syncope and collapse: Secondary | ICD-10-CM | POA: Diagnosis not present

## 2023-04-23 LAB — URINALYSIS, ROUTINE W REFLEX MICROSCOPIC
Bilirubin Urine: NEGATIVE
Glucose, UA: 50 mg/dL — AB
Hgb urine dipstick: NEGATIVE
Ketones, ur: 80 mg/dL — AB
Leukocytes,Ua: NEGATIVE
Nitrite: NEGATIVE
Protein, ur: NEGATIVE mg/dL
Specific Gravity, Urine: 1.023 (ref 1.005–1.030)
pH: 6 (ref 5.0–8.0)

## 2023-04-23 LAB — CBC
HCT: 41 % (ref 36.0–46.0)
Hemoglobin: 13.6 g/dL (ref 12.0–15.0)
MCH: 30.3 pg (ref 26.0–34.0)
MCHC: 33.2 g/dL (ref 30.0–36.0)
MCV: 91.3 fL (ref 80.0–100.0)
Platelets: 189 10*3/uL (ref 150–400)
RBC: 4.49 MIL/uL (ref 3.87–5.11)
RDW: 13.1 % (ref 11.5–15.5)
WBC: 15.5 10*3/uL — ABNORMAL HIGH (ref 4.0–10.5)
nRBC: 0 % (ref 0.0–0.2)

## 2023-04-23 LAB — BASIC METABOLIC PANEL
Anion gap: 8 (ref 5–15)
BUN: 20 mg/dL (ref 8–23)
CO2: 22 mmol/L (ref 22–32)
Calcium: 8.7 mg/dL — ABNORMAL LOW (ref 8.9–10.3)
Chloride: 104 mmol/L (ref 98–111)
Creatinine, Ser: 1.03 mg/dL — ABNORMAL HIGH (ref 0.44–1.00)
GFR, Estimated: 57 mL/min — ABNORMAL LOW (ref >60)
Glucose, Bld: 140 mg/dL — ABNORMAL HIGH (ref 70–99)
Potassium: 4.3 mmol/L (ref 3.5–5.1)
Sodium: 134 mmol/L — ABNORMAL LOW (ref 135–145)

## 2023-04-23 LAB — TSH: TSH: 0.498 u[IU]/mL (ref 0.350–4.500)

## 2023-04-23 LAB — CORTISOL-AM, BLOOD: Cortisol - AM: 29.3 ug/dL — ABNORMAL HIGH (ref 6.7–22.6)

## 2023-04-23 MED ORDER — LACTATED RINGERS IV BOLUS
500.0000 mL | Freq: Once | INTRAVENOUS | Status: AC
  Administered 2023-04-23: 500 mL via INTRAVENOUS

## 2023-04-23 NOTE — Consult Note (Signed)
HD#1 SUBJECTIVE:  Patient Summary: Katherine Donovan is a 77 y.o. female with a pertinent PMH of breast cancer s/p chemotherapy, radiation therapy, and lumpectomy who presented following a fall and is admitted for multiple skull fractures and syncope workup.   Overnight Events: None  Interim History: Patient was evaluated at bedside. Husband was present. No concerns this morning, headache is improved. Denies any palpitations, vision changes, or dizziness.  OBJECTIVE:  Vital Signs: Vitals:   04/23/23 0600 04/23/23 0615 04/23/23 0630 04/23/23 0815  BP: 101/66 104/70 103/67 108/72  Pulse: 60 61 60 68  Resp: 17 18 17  (!) 23  Temp:      TempSrc:      SpO2: 96% 97% 96% 100%   Supplemental O2: Room Air SpO2: 100 %  There were no vitals filed for this visit.   Intake/Output Summary (Last 24 hours) at 04/23/2023 0830 Last data filed at 04/22/2023 1207 Gross per 24 hour  Intake 4.74 ml  Output --  Net 4.74 ml   Net IO Since Admission: 4.74 mL [04/23/23 0830]  Physical Exam:  General: well-appearing, in no acute distress Cardiac: RRR, no m/r/g appreciated; no LE edema Pulmonary: CTA bilaterally, normal rate and effort Neuro: No focal deficits. Alert and oriented x 4.  Psych: Normal mood and affect  Patient Lines/Drains/Airways Status     Active Line/Drains/Airways     Name Placement date Placement time Site Days   Peripheral IV 04/22/23 18 G Anterior;Right Forearm 04/22/23  0935  Forearm  1   Airway 02/04/22  --  -- 443            Pertinent Labs:    Latest Ref Rng & Units 04/23/2023    2:48 AM 04/22/2023    9:37 AM 12/20/2022   10:10 AM  CBC  WBC 4.0 - 10.5 K/uL 15.5  15.6  7.1   Hemoglobin 12.0 - 15.0 g/dL 44.0  10.2  72.5   Hematocrit 36.0 - 46.0 % 41.0  48.7  46.0   Platelets 150 - 400 K/uL 189  198  225        Latest Ref Rng & Units 04/23/2023    2:48 AM 04/22/2023    9:37 AM 12/20/2022   10:10 AM  CMP  Glucose 70 - 99 mg/dL 366  440  93   BUN 8  - 23 mg/dL 20  16  18    Creatinine 0.44 - 1.00 mg/dL 3.47  4.25  9.56   Sodium 135 - 145 mmol/L 134  135  138   Potassium 3.5 - 5.1 mmol/L 4.3  4.3  4.4   Chloride 98 - 111 mmol/L 104  101  106   CO2 22 - 32 mmol/L 22  21  25    Calcium 8.9 - 10.3 mg/dL 8.7  9.3  38.7   Total Protein 6.5 - 8.1 g/dL  6.3  6.2   Total Bilirubin 0.3 - 1.2 mg/dL  0.8  0.6   Alkaline Phos 38 - 126 U/L  33  32   AST 15 - 41 U/L  41  29   ALT 0 - 44 U/L  21  17     No results for input(s): "GLUCAP" in the last 72 hours.   Pertinent Imaging: CT HEAD WO CONTRAST  Result Date: 04/23/2023 CLINICAL DATA:  Subdural hematoma, follow-up EXAM: CT HEAD WITHOUT CONTRAST TECHNIQUE: Contiguous axial images were obtained from the base of the skull through the vertex without intravenous contrast. RADIATION DOSE REDUCTION:  This exam was performed according to the departmental dose-optimization program which includes automated exposure control, adjustment of the mA and/or kV according to patient size and/or use of iterative reconstruction technique. COMPARISON:  04/22/2023 FINDINGS: Brain: Redemonstrated 4 mm bilateral subdural hematomas (series 4, image 24), unchanged. Also unchanged subarachnoid hemorrhage overlying the right anterior frontal lobe. No new focus of hemorrhage. Redemonstrated scattered pneumocephalus. Unchanged size and configuration of the ventricles. No mass, mass effect, or midline shift. Vascular: No hyperdense vessel. Skull: Redemonstrated calvarial and skull base fractures, which are better described on the 04/22/2023 CT head and maxillofacial. No new fracture or suspicious osseous lesion. Sinuses/Orbits: Air-fluid levels in the sphenoid sinuses, consistent with hemosinus, similar to prior. No acute finding in the orbits. Other: The mastoids are well aerated. IMPRESSION: Unchanged 4 mm bilateral subdural hematomas and subarachnoid hemorrhage overlying the right anterior frontal lobe. No new focus of hemorrhage.  Electronically Signed   By: Wiliam Ke M.D.   On: 04/23/2023 00:40   CT ANGIO HEAD NECK W WO CM  Result Date: 04/22/2023 CLINICAL DATA:  Skull fracture, assess for arterial injury. EXAM: CT ANGIOGRAPHY HEAD AND NECK WITH AND WITHOUT CONTRAST TECHNIQUE: Multidetector CT imaging of the head and neck was performed using the standard protocol during bolus administration of intravenous contrast. Multiplanar CT image reconstructions and MIPs were obtained to evaluate the vascular anatomy. Carotid stenosis measurements (when applicable) are obtained utilizing NASCET criteria, using the distal internal carotid diameter as the denominator. RADIATION DOSE REDUCTION: This exam was performed according to the departmental dose-optimization program which includes automated exposure control, adjustment of the mA and/or kV according to patient size and/or use of iterative reconstruction technique. CONTRAST:  75mL OMNIPAQUE IOHEXOL 350 MG/ML SOLN COMPARISON:  Same-day noncontrast CT head FINDINGS: CT HEAD FINDINGS Brain: 4 mm thick bilateral subdural hematomas are unchanged in size. There is unchanged small volume subarachnoid hemorrhage overlying the right anterior frontal lobe. There is no new acute intracranial hemorrhage or acute territorial infarct. There is no mass effect or midline shift. The ventricles are stable in size, without intraventricular hemorrhage. Scattered pneumocephalus is again seen. Vascular: See below. Skull: Calvarial and skull base fractures are again seen, described in detail on the CT head and facial bones from earlier today. Sinuses/Orbits: Bilateral sphenoid hemosinus is again seen. The globes and orbits are unremarkable. Other: The mastoid air cells and middle ear cavities are clear. Review of the MIP images confirms the above findings CTA NECK FINDINGS Aortic arch: There is calcified plaque in the imaged aortic arch. The origins of the brachiocephalic and left common carotid arteries are not  included within the field of view. The origin of the left subclavian artery is patent. The subclavian arteries are patent to the level imaged. Right carotid system: The right common, internal, and external carotid arteries are patent, with minimal plaque at the bifurcation but no hemodynamically significant stenosis or occlusion. There is no evidence of traumatic vascular injury. There is no dissection or aneurysm. Left carotid system: The left common, internal, and external carotid arteries are patent, without significant stenosis or occlusion. There is no evidence of traumatic vascular injury. There is no dissection or aneurysm. Vertebral arteries: The vertebral arteries are patent, without significant stenosis or occlusion there is no evidence of traumatic vascular injury. There is no evidence of dissection or aneurysm. Skeleton: The cervical spine is assessed on the same day cervical spine CT. Other neck: The soft tissues of the neck are unremarkable. Upper chest: The imaged lung  apices are clear. Review of the MIP images confirms the above findings CTA HEAD FINDINGS Anterior circulation: There is mild calcified plaque in the intracranial ICAs without significant stenosis or occlusion. There is no evidence of traumatic injury to the intracranial carotid arteries related to the skull base fractures. The bilateral MCAs and ACAS are patent, without proximal stenosis or occlusion. There is no aneurysm or AVM. Posterior circulation: The bilateral V4 segments are patent, diminutive on the right after the PICA origin which is favored congenital. The basilar artery is patent. The major cerebellar arteries appear patent. The bilateral PCAs are patent, without proximal stenosis or occlusion. Posterior communicating arteries are not identified. There is no aneurysm or AVM. Venous sinuses: There is no definite evidence of venous sinus thrombosis. Anatomic variants: None. Review of the MIP images confirms the above findings  IMPRESSION: 1. Patent vasculature of the head and neck without evidence of traumatic injury. 2. Unchanged 4 mm thick bilateral subdural hematomas and small volume right frontal subarachnoid hemorrhage. 3. Calvarial and skull base fractures described on CT head and facial bones from earlier the same day. Electronically Signed   By: Lesia Hausen M.D.   On: 04/22/2023 13:33   DG Chest Portable 1 View  Result Date: 04/22/2023 CLINICAL DATA:  Fall. EXAM: PORTABLE CHEST 1 VIEW COMPARISON:  None Available. FINDINGS: The heart size and mediastinal contours are within normal limits. Aortic calcification. Both lungs are clear. No pneumothorax or pleural effusion. Surgical clips project over the left chest wall. No acute osseous abnormality. IMPRESSION: No acute findings chest. Electronically Signed   By: Hart Robinsons M.D.   On: 04/22/2023 11:30   CT Head Wo Contrast  Result Date: 04/22/2023 CLINICAL DATA:  Trauma, pain EXAM: CT HEAD WITHOUT CONTRAST CT MAXILLOFACIAL WITHOUT CONTRAST CT CERVICAL SPINE WITHOUT CONTRAST TECHNIQUE: Multidetector CT imaging of the head, cervical spine, and maxillofacial structures were performed using the standard protocol without intravenous contrast. Multiplanar CT image reconstructions of the cervical spine and maxillofacial structures were also generated. RADIATION DOSE REDUCTION: This exam was performed according to the departmental dose-optimization program which includes automated exposure control, adjustment of the mA and/or kV according to patient size and/or use of iterative reconstruction technique. COMPARISON:  None Available. FINDINGS: CT HEAD FINDINGS Brain: There is an acute subdural hematoma overlying the left cerebral convexity measuring up to 4 mm in maximal thickness overlying the parietal lobe (5-19). There is additional acute subdural and small volume subarachnoid blood overlying the right frontal lobe measuring up to 4 mm in thickness (3-12). There is no mass  effect on the underlying brain parenchyma or midline shift. There is no evidence of acute territorial infarct. There is scattered pneumocephalus related to the below described calvarial fractures. Parenchymal volume is normal. The ventricles are normal in size, without evidence of intraventricular hemorrhage. Gray-white differentiation is preserved. The pituitary and suprasellar region are normal. There is no solid mass lesion. Vascular: There is calcification of the bilateral carotid siphons. Skull: There is an acute fracture of the occipital bone at the midline extending to the foramen magnum. There is an acute fracture of the right occipital condyle (7-28 of the facial bone sequence. There is an acute fracture of the right petrous apex extending to the posterior wall of the carotid canal (4-53 of the facial bone images). There is probably an occult fracture of the wall of the right sphenoid sinus given pneumocephalus and hemosinus. Other: The mastoid air cells are clear without evidence of temporal bone fracture.  CT MAXILLOFACIAL FINDINGS Osseous: No other acute facial bone fracture is seen. The pterygoid plates are intact. There is no evidence of mandibular dislocation. Orbits: The globes and orbits are intact. There is no retrobulbar hematoma. Sinuses: There is a right more than left sphenoid hemosinus. 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 Soft tissues: Unremarkable. CT CERVICAL SPINE FINDINGS Alignment: Normal.  There is no evidence of traumatic malalignment. Skull base and vertebrae: Skull base fractures are detailed above. Skull base alignment is maintained. There is age-indeterminate mild compression deformity of the superior T2 endplate (9-32). The other vertebral body heights are preserved. There is no other evidence of acute fracture. There is no suspicious osseous lesion. Soft tissues and spinal canal: No prevertebral fluid or swelling. No visible canal hematoma. Disc levels: There is mild disc  space narrowing and degenerative endplate change at C4-C5 and C5-C6. There is no high-grade spinal canal stenosis. Upper chest: The imaged lung apices are clear. Other: None. IMPRESSION: CT HEAD: 1. Acute subdural hematomas overlying both cerebral convexities measuring up to approximately 4 mm in thickness bilaterally, and small volume subarachnoid hemorrhage overlying the right frontal lobe. No significant mass effect or midline shift. 2. Acute nondisplaced fracture of the occipital bone at the midline extending to the foramen magnum and acute nondisplaced fracture of the right occipital condyle. 3. Acute fracture of the right petrous apex extending to the posterior wall of the carotid canal. Recommend CTA of the head to evaluate for vascular injury. 4. Probable occult fracture of the wall of the right sphenoid sinus given pneumocephalus and hemosinus. 5. No other acute facial bone fracture. 6. Age-indeterminate mild compression deformity of the superior T2 endplate. 7. Otherwise, no acute fracture or traumatic malalignment of the cervical spine. Critical Value/emergent results were called by telephone at the time of interpretation on 04/22/2023 at 11:20 am to provider Tanda Rockers , who verbally acknowledged these results. Electronically Signed   By: Lesia Hausen M.D.   On: 04/22/2023 11:22   CT Cervical Spine Wo Contrast  Result Date: 04/22/2023 CLINICAL DATA:  Trauma, pain EXAM: CT HEAD WITHOUT CONTRAST CT MAXILLOFACIAL WITHOUT CONTRAST CT CERVICAL SPINE WITHOUT CONTRAST TECHNIQUE: Multidetector CT imaging of the head, cervical spine, and maxillofacial structures were performed using the standard protocol without intravenous contrast. Multiplanar CT image reconstructions of the cervical spine and maxillofacial structures were also generated. RADIATION DOSE REDUCTION: This exam was performed according to the departmental dose-optimization program which includes automated exposure control, adjustment of the mA  and/or kV according to patient size and/or use of iterative reconstruction technique. COMPARISON:  None Available. FINDINGS: CT HEAD FINDINGS Brain: There is an acute subdural hematoma overlying the left cerebral convexity measuring up to 4 mm in maximal thickness overlying the parietal lobe (5-19). There is additional acute subdural and small volume subarachnoid blood overlying the right frontal lobe measuring up to 4 mm in thickness (3-12). There is no mass effect on the underlying brain parenchyma or midline shift. There is no evidence of acute territorial infarct. There is scattered pneumocephalus related to the below described calvarial fractures. Parenchymal volume is normal. The ventricles are normal in size, without evidence of intraventricular hemorrhage. Gray-white differentiation is preserved. The pituitary and suprasellar region are normal. There is no solid mass lesion. Vascular: There is calcification of the bilateral carotid siphons. Skull: There is an acute fracture of the occipital bone at the midline extending to the foramen magnum. There is an acute fracture of the right occipital condyle (7-28 of the  facial bone sequence. There is an acute fracture of the right petrous apex extending to the posterior wall of the carotid canal (4-53 of the facial bone images). There is probably an occult fracture of the wall of the right sphenoid sinus given pneumocephalus and hemosinus. Other: The mastoid air cells are clear without evidence of temporal bone fracture. CT MAXILLOFACIAL FINDINGS Osseous: No other acute facial bone fracture is seen. The pterygoid plates are intact. There is no evidence of mandibular dislocation. Orbits: The globes and orbits are intact. There is no retrobulbar hematoma. Sinuses: There is a right more than left sphenoid hemosinus. 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 Soft tissues: Unremarkable. CT CERVICAL SPINE FINDINGS Alignment: Normal.  There is no evidence of traumatic  malalignment. Skull base and vertebrae: Skull base fractures are detailed above. Skull base alignment is maintained. There is age-indeterminate mild compression deformity of the superior T2 endplate (9-32). The other vertebral body heights are preserved. There is no other evidence of acute fracture. There is no suspicious osseous lesion. Soft tissues and spinal canal: No prevertebral fluid or swelling. No visible canal hematoma. Disc levels: There is mild disc space narrowing and degenerative endplate change at C4-C5 and C5-C6. There is no high-grade spinal canal stenosis. Upper chest: The imaged lung apices are clear. Other: None. IMPRESSION: CT HEAD: 1. Acute subdural hematomas overlying both cerebral convexities measuring up to approximately 4 mm in thickness bilaterally, and small volume subarachnoid hemorrhage overlying the right frontal lobe. No significant mass effect or midline shift. 2. Acute nondisplaced fracture of the occipital bone at the midline extending to the foramen magnum and acute nondisplaced fracture of the right occipital condyle. 3. Acute fracture of the right petrous apex extending to the posterior wall of the carotid canal. Recommend CTA of the head to evaluate for vascular injury. 4. Probable occult fracture of the wall of the right sphenoid sinus given pneumocephalus and hemosinus. 5. No other acute facial bone fracture. 6. Age-indeterminate mild compression deformity of the superior T2 endplate. 7. Otherwise, no acute fracture or traumatic malalignment of the cervical spine. Critical Value/emergent results were called by telephone at the time of interpretation on 04/22/2023 at 11:20 am to provider Tanda Rockers , who verbally acknowledged these results. Electronically Signed   By: Lesia Hausen M.D.   On: 04/22/2023 11:22   CT Maxillofacial Wo Contrast  Result Date: 04/22/2023 CLINICAL DATA:  Trauma, pain EXAM: CT HEAD WITHOUT CONTRAST CT MAXILLOFACIAL WITHOUT CONTRAST CT CERVICAL  SPINE WITHOUT CONTRAST TECHNIQUE: Multidetector CT imaging of the head, cervical spine, and maxillofacial structures were performed using the standard protocol without intravenous contrast. Multiplanar CT image reconstructions of the cervical spine and maxillofacial structures were also generated. RADIATION DOSE REDUCTION: This exam was performed according to the departmental dose-optimization program which includes automated exposure control, adjustment of the mA and/or kV according to patient size and/or use of iterative reconstruction technique. COMPARISON:  None Available. FINDINGS: CT HEAD FINDINGS Brain: There is an acute subdural hematoma overlying the left cerebral convexity measuring up to 4 mm in maximal thickness overlying the parietal lobe (5-19). There is additional acute subdural and small volume subarachnoid blood overlying the right frontal lobe measuring up to 4 mm in thickness (3-12). There is no mass effect on the underlying brain parenchyma or midline shift. There is no evidence of acute territorial infarct. There is scattered pneumocephalus related to the below described calvarial fractures. Parenchymal volume is normal. The ventricles are normal in size, without evidence of intraventricular  hemorrhage. Gray-white differentiation is preserved. The pituitary and suprasellar region are normal. There is no solid mass lesion. Vascular: There is calcification of the bilateral carotid siphons. Skull: There is an acute fracture of the occipital bone at the midline extending to the foramen magnum. There is an acute fracture of the right occipital condyle (7-28 of the facial bone sequence. There is an acute fracture of the right petrous apex extending to the posterior wall of the carotid canal (4-53 of the facial bone images). There is probably an occult fracture of the wall of the right sphenoid sinus given pneumocephalus and hemosinus. Other: The mastoid air cells are clear without evidence of temporal  bone fracture. CT MAXILLOFACIAL FINDINGS Osseous: No other acute facial bone fracture is seen. The pterygoid plates are intact. There is no evidence of mandibular dislocation. Orbits: The globes and orbits are intact. There is no retrobulbar hematoma. Sinuses: There is a right more than left sphenoid hemosinus. 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 Soft tissues: Unremarkable. CT CERVICAL SPINE FINDINGS Alignment: Normal.  There is no evidence of traumatic malalignment. Skull base and vertebrae: Skull base fractures are detailed above. Skull base alignment is maintained. There is age-indeterminate mild compression deformity of the superior T2 endplate (9-32). The other vertebral body heights are preserved. There is no other evidence of acute fracture. There is no suspicious osseous lesion. Soft tissues and spinal canal: No prevertebral fluid or swelling. No visible canal hematoma. Disc levels: There is mild disc space narrowing and degenerative endplate change at C4-C5 and C5-C6. There is no high-grade spinal canal stenosis. Upper chest: The imaged lung apices are clear. Other: None. IMPRESSION: CT HEAD: 1. Acute subdural hematomas overlying both cerebral convexities measuring up to approximately 4 mm in thickness bilaterally, and small volume subarachnoid hemorrhage overlying the right frontal lobe. No significant mass effect or midline shift. 2. Acute nondisplaced fracture of the occipital bone at the midline extending to the foramen magnum and acute nondisplaced fracture of the right occipital condyle. 3. Acute fracture of the right petrous apex extending to the posterior wall of the carotid canal. Recommend CTA of the head to evaluate for vascular injury. 4. Probable occult fracture of the wall of the right sphenoid sinus given pneumocephalus and hemosinus. 5. No other acute facial bone fracture. 6. Age-indeterminate mild compression deformity of the superior T2 endplate. 7. Otherwise, no acute fracture or  traumatic malalignment of the cervical spine. Critical Value/emergent results were called by telephone at the time of interpretation on 04/22/2023 at 11:20 am to provider Tanda Rockers , who verbally acknowledged these results. Electronically Signed   By: Lesia Hausen M.D.   On: 04/22/2023 11:22    ASSESSMENT/PLAN:  Assessment: Principal Problem:   Syncope  Plan: Syncope, in the setting of orthostatic hypotension Her orthostatics were positive this morning with a BP drop from 120/68 standing to 105/69 sitting to 87/55 standing. She is not on any medications likely to be contributory. She states she drinks plenty of fluids and denies recent diarrhea, but may have some volume depletion. AM cortisol was slightly elevated, no concern for adrenal insufficiency. TSH 0.5, unremarkable. Electrolytes remain within normal limits. It is possible that she has some autonomic dysfunction secondary to her chemotherapy and radiation therapy.  -Encourage increased salt in diet and increased fluid intake -Compression stockings -500cc LR bolus given -Follow up Echocardiogram -Rolling walker for discharge per PT  Subdural hematomas, subarachnoid hemorrhage, nondisplaced occipital bone fracture, nondisplaced right occipital condyle fracture, right petrous apex  fracture, probable fracture of right sphenoid sinus- Defer to surgery team for management. History of breast cancer s/p chemotherapy, radiation therapy, lumpectomy- Continue tamoxifen. Pain management- per surgery team. Osteoporosis- prior DEXA in 09/2022 with T score -3.6. Prior compression fracture in July 2019. Previously on Fosamax for ~10 years, stopped recently when tamoxifen was started. Currently taking vitamin D and calcium. Consider restarting a bisphosphonate on discharge.   IMTS to continue to follow until workup is completed, pending echocardiogram.   Signature: Annett Fabian, MD  Internal Medicine Resident, PGY-1 [facility]  Pager: [phone] 8:30 AM, 04/23/2023   Please contact the on call pager after 5 pm and on weekends at [phone].

## 2023-04-23 NOTE — Progress Notes (Signed)
Progress Note     Subjective: Pt reports mild headache but overall feels well. She wants to go home. Husband at bedside. We discussed that we would recommend she work with therapies prior to discharge and make sure no further inpatient workup needed for syncope. She is agreeable to stay.   Objective: Vital signs in last 24 hours: Temp:  [97.8 F (36.6 C)] 97.8 F (36.6 C) (10/19 0514) Pulse Rate:  [51-84] 68 (10/19 0815) Resp:  [12-23] 23 (10/19 0815) BP: (90-178)/(49-90) 108/72 (10/19 0815) SpO2:  [84 %-100 %] 100 % (10/19 0815)    Intake/Output from previous day: 10/18 0701 - 10/19 0700 In: 4.7 [I.V.:4.7] Out: -  Intake/Output this shift: No intake/output data recorded.  PE: General: pleasant, WD, thin female who is laying in bed in NAD HEENT: head is normocephalic, atraumatic.  Sclera are noninjected. EOMI.  Ears and nose without any masses or lesions.  Mouth is pink and moist Heart: regular, rate, and rhythm.  Normal s1,s2. No obvious murmurs, gallops, or rubs noted.  Palpable radial and pedal pulses bilaterally Lungs: CTAB, no wheezes, rhonchi, or rales noted.  Respiratory effort nonlabored Abd: soft, NT, ND, +BS, no masses, hernias, or organomegaly MS: all 4 extremities are symmetrical with no cyanosis, clubbing, or edema. Skin: warm and dry with no masses, lesions, or rashes Neuro: Cranial nerves 2-12 grossly intact, sensation is normal throughout Psych: A&Ox3 with an appropriate affect.    Lab Results:  Recent Labs    04/22/23 0937 04/23/23 0248  WBC 15.6* 15.5*  HGB 16.1* 13.6  HCT 48.7* 41.0  PLT 198 189   BMET Recent Labs    04/22/23 0937 04/23/23 0248  NA 135 134*  K 4.3 4.3  CL 101 104  CO2 21* 22  GLUCOSE 155* 140*  BUN 16 20  CREATININE 0.93 1.03*  CALCIUM 9.3 8.7*   PT/INR No results for input(s): "LABPROT", "INR" in the last 72 hours. CMP     Component Value Date/Time   NA 134 (L) 04/23/2023 0248   K 4.3 04/23/2023 0248   CL 104  04/23/2023 0248   CO2 22 04/23/2023 0248   GLUCOSE 140 (H) 04/23/2023 0248   BUN 20 04/23/2023 0248   CREATININE 1.03 (H) 04/23/2023 0248   CREATININE 0.73 12/20/2022 1010   CALCIUM 8.7 (L) 04/23/2023 0248   PROT 6.3 (L) 04/22/2023 0937   ALBUMIN 3.8 04/22/2023 0937   AST 41 04/22/2023 0937   AST 29 12/20/2022 1010   ALT 21 04/22/2023 0937   ALT 17 12/20/2022 1010   ALKPHOS 33 (L) 04/22/2023 0937   BILITOT 0.8 04/22/2023 0937   BILITOT 0.6 12/20/2022 1010   GFRNONAA 57 (L) 04/23/2023 0248   GFRNONAA >60 12/20/2022 1010   Lipase  No results found for: "LIPASE"     Studies/Results: CT HEAD WO CONTRAST  Result Date: 04/23/2023 CLINICAL DATA:  Subdural hematoma, follow-up EXAM: CT HEAD WITHOUT CONTRAST TECHNIQUE: Contiguous axial images were obtained from the base of the skull through the vertex without intravenous contrast. RADIATION DOSE REDUCTION: This exam was performed according to the departmental dose-optimization program which includes automated exposure control, adjustment of the mA and/or kV according to patient size and/or use of iterative reconstruction technique. COMPARISON:  04/22/2023 FINDINGS: Brain: Redemonstrated 4 mm bilateral subdural hematomas (series 4, image 24), unchanged. Also unchanged subarachnoid hemorrhage overlying the right anterior frontal lobe. No new focus of hemorrhage. Redemonstrated scattered pneumocephalus. Unchanged size and configuration of the ventricles. No mass, mass  effect, or midline shift. Vascular: No hyperdense vessel. Skull: Redemonstrated calvarial and skull base fractures, which are better described on the 04/22/2023 CT head and maxillofacial. No new fracture or suspicious osseous lesion. Sinuses/Orbits: Air-fluid levels in the sphenoid sinuses, consistent with hemosinus, similar to prior. No acute finding in the orbits. Other: The mastoids are well aerated. IMPRESSION: Unchanged 4 mm bilateral subdural hematomas and subarachnoid hemorrhage  overlying the right anterior frontal lobe. No new focus of hemorrhage. Electronically Signed   By: Wiliam Ke M.D.   On: 04/23/2023 00:40   CT ANGIO HEAD NECK W WO CM  Result Date: 04/22/2023 CLINICAL DATA:  Skull fracture, assess for arterial injury. EXAM: CT ANGIOGRAPHY HEAD AND NECK WITH AND WITHOUT CONTRAST TECHNIQUE: Multidetector CT imaging of the head and neck was performed using the standard protocol during bolus administration of intravenous contrast. Multiplanar CT image reconstructions and MIPs were obtained to evaluate the vascular anatomy. Carotid stenosis measurements (when applicable) are obtained utilizing NASCET criteria, using the distal internal carotid diameter as the denominator. RADIATION DOSE REDUCTION: This exam was performed according to the departmental dose-optimization program which includes automated exposure control, adjustment of the mA and/or kV according to patient size and/or use of iterative reconstruction technique. CONTRAST:  75mL OMNIPAQUE IOHEXOL 350 MG/ML SOLN COMPARISON:  Same-day noncontrast CT head FINDINGS: CT HEAD FINDINGS Brain: 4 mm thick bilateral subdural hematomas are unchanged in size. There is unchanged small volume subarachnoid hemorrhage overlying the right anterior frontal lobe. There is no new acute intracranial hemorrhage or acute territorial infarct. There is no mass effect or midline shift. The ventricles are stable in size, without intraventricular hemorrhage. Scattered pneumocephalus is again seen. Vascular: See below. Skull: Calvarial and skull base fractures are again seen, described in detail on the CT head and facial bones from earlier today. Sinuses/Orbits: Bilateral sphenoid hemosinus is again seen. The globes and orbits are unremarkable. Other: The mastoid air cells and middle ear cavities are clear. Review of the MIP images confirms the above findings CTA NECK FINDINGS Aortic arch: There is calcified plaque in the imaged aortic arch. The  origins of the brachiocephalic and left common carotid arteries are not included within the field of view. The origin of the left subclavian artery is patent. The subclavian arteries are patent to the level imaged. Right carotid system: The right common, internal, and external carotid arteries are patent, with minimal plaque at the bifurcation but no hemodynamically significant stenosis or occlusion. There is no evidence of traumatic vascular injury. There is no dissection or aneurysm. Left carotid system: The left common, internal, and external carotid arteries are patent, without significant stenosis or occlusion. There is no evidence of traumatic vascular injury. There is no dissection or aneurysm. Vertebral arteries: The vertebral arteries are patent, without significant stenosis or occlusion there is no evidence of traumatic vascular injury. There is no evidence of dissection or aneurysm. Skeleton: The cervical spine is assessed on the same day cervical spine CT. Other neck: The soft tissues of the neck are unremarkable. Upper chest: The imaged lung apices are clear. Review of the MIP images confirms the above findings CTA HEAD FINDINGS Anterior circulation: There is mild calcified plaque in the intracranial ICAs without significant stenosis or occlusion. There is no evidence of traumatic injury to the intracranial carotid arteries related to the skull base fractures. The bilateral MCAs and ACAS are patent, without proximal stenosis or occlusion. There is no aneurysm or AVM. Posterior circulation: The bilateral V4 segments are patent,  diminutive on the right after the PICA origin which is favored congenital. The basilar artery is patent. The major cerebellar arteries appear patent. The bilateral PCAs are patent, without proximal stenosis or occlusion. Posterior communicating arteries are not identified. There is no aneurysm or AVM. Venous sinuses: There is no definite evidence of venous sinus thrombosis.  Anatomic variants: None. Review of the MIP images confirms the above findings IMPRESSION: 1. Patent vasculature of the head and neck without evidence of traumatic injury. 2. Unchanged 4 mm thick bilateral subdural hematomas and small volume right frontal subarachnoid hemorrhage. 3. Calvarial and skull base fractures described on CT head and facial bones from earlier the same day. Electronically Signed   By: Lesia Hausen M.D.   On: 04/22/2023 13:33   DG Chest Portable 1 View  Result Date: 04/22/2023 CLINICAL DATA:  Fall. EXAM: PORTABLE CHEST 1 VIEW COMPARISON:  None Available. FINDINGS: The heart size and mediastinal contours are within normal limits. Aortic calcification. Both lungs are clear. No pneumothorax or pleural effusion. Surgical clips project over the left chest wall. No acute osseous abnormality. IMPRESSION: No acute findings chest. Electronically Signed   By: Hart Robinsons M.D.   On: 04/22/2023 11:30   CT Head Wo Contrast  Result Date: 04/22/2023 CLINICAL DATA:  Trauma, pain EXAM: CT HEAD WITHOUT CONTRAST CT MAXILLOFACIAL WITHOUT CONTRAST CT CERVICAL SPINE WITHOUT CONTRAST TECHNIQUE: Multidetector CT imaging of the head, cervical spine, and maxillofacial structures were performed using the standard protocol without intravenous contrast. Multiplanar CT image reconstructions of the cervical spine and maxillofacial structures were also generated. RADIATION DOSE REDUCTION: This exam was performed according to the departmental dose-optimization program which includes automated exposure control, adjustment of the mA and/or kV according to patient size and/or use of iterative reconstruction technique. COMPARISON:  None Available. FINDINGS: CT HEAD FINDINGS Brain: There is an acute subdural hematoma overlying the left cerebral convexity measuring up to 4 mm in maximal thickness overlying the parietal lobe (5-19). There is additional acute subdural and small volume subarachnoid blood overlying the  right frontal lobe measuring up to 4 mm in thickness (3-12). There is no mass effect on the underlying brain parenchyma or midline shift. There is no evidence of acute territorial infarct. There is scattered pneumocephalus related to the below described calvarial fractures. Parenchymal volume is normal. The ventricles are normal in size, without evidence of intraventricular hemorrhage. Gray-white differentiation is preserved. The pituitary and suprasellar region are normal. There is no solid mass lesion. Vascular: There is calcification of the bilateral carotid siphons. Skull: There is an acute fracture of the occipital bone at the midline extending to the foramen magnum. There is an acute fracture of the right occipital condyle (7-28 of the facial bone sequence. There is an acute fracture of the right petrous apex extending to the posterior wall of the carotid canal (4-53 of the facial bone images). There is probably an occult fracture of the wall of the right sphenoid sinus given pneumocephalus and hemosinus. Other: The mastoid air cells are clear without evidence of temporal bone fracture. CT MAXILLOFACIAL FINDINGS Osseous: No other acute facial bone fracture is seen. The pterygoid plates are intact. There is no evidence of mandibular dislocation. Orbits: The globes and orbits are intact. There is no retrobulbar hematoma. Sinuses: There is a right more than left sphenoid hemosinus. 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 Soft tissues: Unremarkable. CT CERVICAL SPINE FINDINGS Alignment: Normal.  There is no evidence of traumatic malalignment. Skull base and vertebrae: Skull base  fractures are detailed above. Skull base alignment is maintained. There is age-indeterminate mild compression deformity of the superior T2 endplate (9-32). The other vertebral body heights are preserved. There is no other evidence of acute fracture. There is no suspicious osseous lesion. Soft tissues and spinal canal: No prevertebral  fluid or swelling. No visible canal hematoma. Disc levels: There is mild disc space narrowing and degenerative endplate change at C4-C5 and C5-C6. There is no high-grade spinal canal stenosis. Upper chest: The imaged lung apices are clear. Other: None. IMPRESSION: CT HEAD: 1. Acute subdural hematomas overlying both cerebral convexities measuring up to approximately 4 mm in thickness bilaterally, and small volume subarachnoid hemorrhage overlying the right frontal lobe. No significant mass effect or midline shift. 2. Acute nondisplaced fracture of the occipital bone at the midline extending to the foramen magnum and acute nondisplaced fracture of the right occipital condyle. 3. Acute fracture of the right petrous apex extending to the posterior wall of the carotid canal. Recommend CTA of the head to evaluate for vascular injury. 4. Probable occult fracture of the wall of the right sphenoid sinus given pneumocephalus and hemosinus. 5. No other acute facial bone fracture. 6. Age-indeterminate mild compression deformity of the superior T2 endplate. 7. Otherwise, no acute fracture or traumatic malalignment of the cervical spine. Critical Value/emergent results were called by telephone at the time of interpretation on 04/22/2023 at 11:20 am to provider Tanda Rockers , who verbally acknowledged these results. Electronically Signed   By: Lesia Hausen M.D.   On: 04/22/2023 11:22   CT Cervical Spine Wo Contrast  Result Date: 04/22/2023 CLINICAL DATA:  Trauma, pain EXAM: CT HEAD WITHOUT CONTRAST CT MAXILLOFACIAL WITHOUT CONTRAST CT CERVICAL SPINE WITHOUT CONTRAST TECHNIQUE: Multidetector CT imaging of the head, cervical spine, and maxillofacial structures were performed using the standard protocol without intravenous contrast. Multiplanar CT image reconstructions of the cervical spine and maxillofacial structures were also generated. RADIATION DOSE REDUCTION: This exam was performed according to the departmental  dose-optimization program which includes automated exposure control, adjustment of the mA and/or kV according to patient size and/or use of iterative reconstruction technique. COMPARISON:  None Available. FINDINGS: CT HEAD FINDINGS Brain: There is an acute subdural hematoma overlying the left cerebral convexity measuring up to 4 mm in maximal thickness overlying the parietal lobe (5-19). There is additional acute subdural and small volume subarachnoid blood overlying the right frontal lobe measuring up to 4 mm in thickness (3-12). There is no mass effect on the underlying brain parenchyma or midline shift. There is no evidence of acute territorial infarct. There is scattered pneumocephalus related to the below described calvarial fractures. Parenchymal volume is normal. The ventricles are normal in size, without evidence of intraventricular hemorrhage. Gray-white differentiation is preserved. The pituitary and suprasellar region are normal. There is no solid mass lesion. Vascular: There is calcification of the bilateral carotid siphons. Skull: There is an acute fracture of the occipital bone at the midline extending to the foramen magnum. There is an acute fracture of the right occipital condyle (7-28 of the facial bone sequence. There is an acute fracture of the right petrous apex extending to the posterior wall of the carotid canal (4-53 of the facial bone images). There is probably an occult fracture of the wall of the right sphenoid sinus given pneumocephalus and hemosinus. Other: The mastoid air cells are clear without evidence of temporal bone fracture. CT MAXILLOFACIAL FINDINGS Osseous: No other acute facial bone fracture is seen. The pterygoid plates are  intact. There is no evidence of mandibular dislocation. Orbits: The globes and orbits are intact. There is no retrobulbar hematoma. Sinuses: There is a right more than left sphenoid hemosinus. 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 Soft tissues:  Unremarkable. CT CERVICAL SPINE FINDINGS Alignment: Normal.  There is no evidence of traumatic malalignment. Skull base and vertebrae: Skull base fractures are detailed above. Skull base alignment is maintained. There is age-indeterminate mild compression deformity of the superior T2 endplate (9-32). The other vertebral body heights are preserved. There is no other evidence of acute fracture. There is no suspicious osseous lesion. Soft tissues and spinal canal: No prevertebral fluid or swelling. No visible canal hematoma. Disc levels: There is mild disc space narrowing and degenerative endplate change at C4-C5 and C5-C6. There is no high-grade spinal canal stenosis. Upper chest: The imaged lung apices are clear. Other: None. IMPRESSION: CT HEAD: 1. Acute subdural hematomas overlying both cerebral convexities measuring up to approximately 4 mm in thickness bilaterally, and small volume subarachnoid hemorrhage overlying the right frontal lobe. No significant mass effect or midline shift. 2. Acute nondisplaced fracture of the occipital bone at the midline extending to the foramen magnum and acute nondisplaced fracture of the right occipital condyle. 3. Acute fracture of the right petrous apex extending to the posterior wall of the carotid canal. Recommend CTA of the head to evaluate for vascular injury. 4. Probable occult fracture of the wall of the right sphenoid sinus given pneumocephalus and hemosinus. 5. No other acute facial bone fracture. 6. Age-indeterminate mild compression deformity of the superior T2 endplate. 7. Otherwise, no acute fracture or traumatic malalignment of the cervical spine. Critical Value/emergent results were called by telephone at the time of interpretation on 04/22/2023 at 11:20 am to provider Tanda Rockers , who verbally acknowledged these results. Electronically Signed   By: Lesia Hausen M.D.   On: 04/22/2023 11:22   CT Maxillofacial Wo Contrast  Result Date: 04/22/2023 CLINICAL DATA:   Trauma, pain EXAM: CT HEAD WITHOUT CONTRAST CT MAXILLOFACIAL WITHOUT CONTRAST CT CERVICAL SPINE WITHOUT CONTRAST TECHNIQUE: Multidetector CT imaging of the head, cervical spine, and maxillofacial structures were performed using the standard protocol without intravenous contrast. Multiplanar CT image reconstructions of the cervical spine and maxillofacial structures were also generated. RADIATION DOSE REDUCTION: This exam was performed according to the departmental dose-optimization program which includes automated exposure control, adjustment of the mA and/or kV according to patient size and/or use of iterative reconstruction technique. COMPARISON:  None Available. FINDINGS: CT HEAD FINDINGS Brain: There is an acute subdural hematoma overlying the left cerebral convexity measuring up to 4 mm in maximal thickness overlying the parietal lobe (5-19). There is additional acute subdural and small volume subarachnoid blood overlying the right frontal lobe measuring up to 4 mm in thickness (3-12). There is no mass effect on the underlying brain parenchyma or midline shift. There is no evidence of acute territorial infarct. There is scattered pneumocephalus related to the below described calvarial fractures. Parenchymal volume is normal. The ventricles are normal in size, without evidence of intraventricular hemorrhage. Gray-white differentiation is preserved. The pituitary and suprasellar region are normal. There is no solid mass lesion. Vascular: There is calcification of the bilateral carotid siphons. Skull: There is an acute fracture of the occipital bone at the midline extending to the foramen magnum. There is an acute fracture of the right occipital condyle (7-28 of the facial bone sequence. There is an acute fracture of the right petrous apex extending to the posterior  wall of the carotid canal (4-53 of the facial bone images). There is probably an occult fracture of the wall of the right sphenoid sinus given  pneumocephalus and hemosinus. Other: The mastoid air cells are clear without evidence of temporal bone fracture. CT MAXILLOFACIAL FINDINGS Osseous: No other acute facial bone fracture is seen. The pterygoid plates are intact. There is no evidence of mandibular dislocation. Orbits: The globes and orbits are intact. There is no retrobulbar hematoma. Sinuses: There is a right more than left sphenoid hemosinus. 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 Soft tissues: Unremarkable. CT CERVICAL SPINE FINDINGS Alignment: Normal.  There is no evidence of traumatic malalignment. Skull base and vertebrae: Skull base fractures are detailed above. Skull base alignment is maintained. There is age-indeterminate mild compression deformity of the superior T2 endplate (9-32). The other vertebral body heights are preserved. There is no other evidence of acute fracture. There is no suspicious osseous lesion. Soft tissues and spinal canal: No prevertebral fluid or swelling. No visible canal hematoma. Disc levels: There is mild disc space narrowing and degenerative endplate change at C4-C5 and C5-C6. There is no high-grade spinal canal stenosis. Upper chest: The imaged lung apices are clear. Other: None. IMPRESSION: CT HEAD: 1. Acute subdural hematomas overlying both cerebral convexities measuring up to approximately 4 mm in thickness bilaterally, and small volume subarachnoid hemorrhage overlying the right frontal lobe. No significant mass effect or midline shift. 2. Acute nondisplaced fracture of the occipital bone at the midline extending to the foramen magnum and acute nondisplaced fracture of the right occipital condyle. 3. Acute fracture of the right petrous apex extending to the posterior wall of the carotid canal. Recommend CTA of the head to evaluate for vascular injury. 4. Probable occult fracture of the wall of the right sphenoid sinus given pneumocephalus and hemosinus. 5. No other acute facial bone fracture. 6.  Age-indeterminate mild compression deformity of the superior T2 endplate. 7. Otherwise, no acute fracture or traumatic malalignment of the cervical spine. Critical Value/emergent results were called by telephone at the time of interpretation on 04/22/2023 at 11:20 am to provider Tanda Rockers , who verbally acknowledged these results. Electronically Signed   By: Lesia Hausen M.D.   On: 04/22/2023 11:22    Anti-infectives: Anti-infectives (From admission, onward)    None        Assessment/Plan  GLF Syncope - TRH consult for workup, appreciate assistance Skull fractures with SDH/SAH - NSGY Dr. Danielle Dess consulted. No surgical intervention recommended. Keppra x7 days for seizure ppx. Repeat CTH stable.  HLD - POA Leukocytosis - WBC stable at 15, afebrile, repeat CBC in AM     FEN: Regular, bolus for orthostasis  ID: none indicated VTE: LMWH on hold in setting up head injury   Dispo: awaiting progressive bed. TBI therapies and syncope workup. Husband updated at bedside.   LOS: 1 day   I reviewed Consultant NS notes, hospitalist notes, last 24 h vitals and pain scores, last 48 h intake and output, last 24 h labs and trends, and last 24 h imaging results.   Juliet Rude, [facility] Surgery 04/23/2023, 9:24 AM Please see Amion for pager number during day hours 7:00am-4:30pm

## 2023-04-23 NOTE — Evaluation (Addendum)
Physical Therapy Evaluation Patient Details Name: Katherine Donovan MRN: [id number] DOB: 11/03/1945 Today's Date: 04/23/2023  History of Present Illness  Pt is 77 yo admitted s/p fall with LOC in which she sustained skull fx with SDH/SAH (no intervention recommended per neurosurg).  PMH significant for breast CA and hyperlipidemia  Clinical Impression  Patient presents with some mild balance deficits, likely due to bunions and ambulating without shoes and having been on bedrest last 24 hours.  I feel with increased mobility, patient will return to her baseline.  Recommended husband stay close by and patient use rolling walker for next several days to increase balance.  Feel patient can return home with above.          If plan is discharge home, recommend the following: A little help with walking and/or transfers;A little help with bathing/dressing/bathroom   Can travel by private vehicle        Equipment Recommendations Rolling walker (2 wheels)  Recommendations for Other Services       Functional Status Assessment Patient has had a recent decline in their functional status and demonstrates the ability to make significant improvements in function in a reasonable and predictable amount of time.     Precautions / Restrictions Precautions Precautions: None Restrictions Weight Bearing Restrictions: No      Mobility  Bed Mobility Overal bed mobility: Modified Independent                  Transfers Overall transfer level: Needs assistance Equipment used: None Transfers: Sit to/from Stand Sit to Stand: Contact guard assist                Ambulation/Gait Ambulation/Gait assistance: Contact guard assist Gait Distance (Feet): 150 Feet Assistive device: None Gait Pattern/deviations: Step-to pattern, Drifts right/left, Antalgic Gait velocity: decreased     General Gait Details: pt reports bunions on left foot which throws off her balance at time.  patient  drifting right and left slightly during gait.  Recommended patient use cane for gait over next few days.  Stairs            Wheelchair Mobility     Tilt Bed    Modified Rankin (Stroke Patients Only)       Balance Overall balance assessment: Needs assistance Sitting-balance support: No upper extremity supported Sitting balance-Leahy Scale: Normal     Standing balance support: No upper extremity supported, During functional activity Standing balance-Leahy Scale: Good                               Pertinent Vitals/Pain Pain Assessment Pain Assessment: 0-10 Pain Score: 3  Pain Location: back of head Pain Descriptors / Indicators: Discomfort, Dull, Sore    Home Living Family/patient expects to be discharged to:: Private residence Living Arrangements: Spouse/significant other Available Help at Discharge: Family Type of Home: House Home Access: Level entry     Alternate Level Stairs-Number of Steps: flight to loft Home Layout: Two level;Able to live on main level with bedroom/bathroom Home Equipment: Cane - quad;Shower seat - built in      Prior Function Prior Level of Function : Independent/Modified Independent;Driving             Mobility Comments: uses 2 quad canes to walk when needed ADLs Comments: mod I     Extremity/Trunk Assessment   Upper Extremity Assessment Upper Extremity Assessment: Overall WFL for tasks assessed    Lower Extremity  Assessment Lower Extremity Assessment: Overall WFL for tasks assessed    Cervical / Trunk Assessment Cervical / Trunk Assessment: Normal  Communication   Communication Communication: No apparent difficulties  Cognition Arousal: Alert Behavior During Therapy: WFL for tasks assessed/performed Overall Cognitive Status: Within Functional Limits for tasks assessed                                          General Comments General comments (skin integrity, edema, etc.): VSS on  RA    Exercises     Assessment/Plan    PT Assessment Patient does not need any further PT services  PT Problem List         PT Treatment Interventions      PT Goals (Current goals can be found in the Care Plan section)  Acute Rehab PT Goals Patient Stated Goal: go home PT Goal Formulation: With patient Time For Goal Achievement: 04/30/23 Potential to Achieve Goals: Good    Frequency       Co-evaluation               AM-PAC PT "6 Clicks" Mobility  Outcome Measure Help needed turning from your back to your side while in a flat bed without using bedrails?: None Help needed moving from lying on your back to sitting on the side of a flat bed without using bedrails?: None Help needed moving to and from a bed to a chair (including a wheelchair)?: A Little Help needed standing up from a chair using your arms (e.g., wheelchair or bedside chair)?: A Little Help needed to walk in hospital room?: A Little Help needed climbing 3-5 steps with a railing? : A Little 6 Click Score: 20    End of Session Equipment Utilized During Treatment: Gait belt Activity Tolerance: Patient tolerated treatment well Patient left: in bed;with call bell/phone within reach Nurse Communication: Mobility status PT Visit Diagnosis: Unsteadiness on feet (R26.81)    Time: 1040-1052 PT Time Calculation (min) (ACUTE ONLY): 12 min   Charges:   PT Evaluation $PT Eval Moderate Complexity: 1 Mod   PT General Charges $$ ACUTE PT VISIT: 1 Visit         04/23/2023 Delray Alt, PT Acute Rehabilitation Services Office:  [phone]   Olivia Canter 04/23/2023, 12:20 PM

## 2023-04-23 NOTE — Evaluation (Signed)
Occupational Therapy Evaluation Patient Details Name: Katherine Donovan MRN: [id number] DOB: 1946/05/20 Today's Date: 04/23/2023   History of Present Illness Pt is 77 yo admitted s/p fall with LOC in which she sustained skull fx with SDH/SAH (no intervention recommended per neurosurg).  PMH significant for breast CA and hyperlipidemia   Clinical Impression   Katherine Donovan was evaluated s/p the above admission list. She lives with her husband and is mod I at baseline, pt states she ambulates with bilateral quad canes intermittently as needed. Upon evaluation the pt was limited by unsteady gait, mild HA pain and decreased activity tolerance. Overall she completed bed mobility with mod I and transfers/mobility with superivsion A and RW, no overt LOB or safety concern. Due to the deficits listed below the pt also needs up to superivsion A for ADLs with minimal cues for safety. Pt does not need further acute OT services and recommend d/c home with support of husband and RW.        If plan is discharge home, recommend the following: A little help with walking and/or transfers;A little help with bathing/dressing/bathroom;Assistance with cooking/housework;Assist for transportation    Functional Status Assessment  Patient has had a recent decline in their functional status and demonstrates the ability to make significant improvements in function in a reasonable and predictable amount of time.  Equipment Recommendations  Other (comment) (RW)       Precautions / Restrictions Precautions Precautions: None Restrictions Weight Bearing Restrictions: No      Mobility Bed Mobility Overal bed mobility: Modified Independent                  Transfers Overall transfer level: Needs assistance Equipment used: Rolling walker (2 wheels) Transfers: Sit to/from Stand Sit to Stand: Supervision           General transfer comment: CGA initially for safety, pt quickly progressed to supeivison A wtih no  LOB or safety concern      Balance Overall balance assessment: Needs assistance Sitting-balance support: No upper extremity supported Sitting balance-Leahy Scale: Normal     Standing balance support: No upper extremity supported, During functional activity Standing balance-Leahy Scale: Good                             ADL either performed or assessed with clinical judgement   ADL Overall ADL's : Modified independent                                       General ADL Comments: generalized superivsion A for safety only, overall pt demonstraetd mod I ability with RW     Vision Baseline Vision/History: 1 Wears glasses Vision Assessment?: No apparent visual deficits     Perception Perception: Within Functional Limits       Praxis Praxis: WFL       Pertinent Vitals/Pain Pain Assessment Pain Assessment: Faces Faces Pain Scale: Hurts a little bit Pain Location: head Pain Descriptors / Indicators: Headache Pain Intervention(s): Limited activity within patient's tolerance, Monitored during session     Extremity/Trunk Assessment Upper Extremity Assessment Upper Extremity Assessment: Overall WFL for tasks assessed   Lower Extremity Assessment Lower Extremity Assessment: Overall WFL for tasks assessed   Cervical / Trunk Assessment Cervical / Trunk Assessment: Normal   Communication Communication Communication: No apparent difficulties   Cognition Arousal: Alert Behavior During Therapy:  WFL for tasks assessed/performed Overall Cognitive Status: Within Functional Limits for tasks assessed                                 General Comments: eager to go home     General Comments  VSS on RA    Exercises     Shoulder Instructions      Home Living Family/patient expects to be discharged to:: Private residence Living Arrangements: Spouse/significant other Available Help at Discharge: Family Type of Home: House Home Access:  Level entry     Home Layout: Two level;Able to live on main level with bedroom/bathroom Alternate Level Stairs-Number of Steps: flight to loft   Bathroom Shower/Tub: Producer, television/film/video: Standard     Home Equipment: Cane - quad;Shower seat - built in          Prior Functioning/Environment Prior Level of Function : Independent/Modified Independent;Driving             Mobility Comments: uses 2 quad canes to walk when needed ADLs Comments: mod I        OT Problem List: Impaired balance (sitting and/or standing);Decreased activity tolerance      OT Treatment/Interventions:      OT Goals(Current goals can be found in the care plan section) Acute Rehab OT Goals Patient Stated Goal: home today OT Goal Formulation: With patient Time For Goal Achievement: 04/23/23 Potential to Achieve Goals: Good  OT Frequency:      Co-evaluation              AM-PAC OT "6 Clicks" Daily Activity     Outcome Measure Help from another person eating meals?: None Help from another person taking care of personal grooming?: A Little Help from another person toileting, which includes using toliet, bedpan, or urinal?: A Little Help from another person bathing (including washing, rinsing, drying)?: A Little Help from another person to put on and taking off regular upper body clothing?: A Little Help from another person to put on and taking off regular lower body clothing?: A Little 6 Click Score: 19   End of Session Equipment Utilized During Treatment: Rolling walker (2 wheels) Nurse Communication: Mobility status  Activity Tolerance: Patient tolerated treatment well Patient left: in bed;with call bell/phone within reach  OT Visit Diagnosis: Unsteadiness on feet (R26.81);Muscle weakness (generalized) (M62.81);History of falling (Z91.81)                Time: 1134-1150 OT Time Calculation (min): 16 min Charges:  OT General Charges $OT Visit: 1 Visit OT Evaluation $OT  Eval Moderate Complexity: 1 Mod  Derenda Mis, OTR/L Acute Rehabilitation Services Office [phone] Secure Chat Communication Preferred   Donia Pounds 04/23/2023, 11:59 AM

## 2023-04-23 NOTE — ED Notes (Addendum)
Rounding team (Dr. Criselda Peaches) at bedside

## 2023-04-23 NOTE — ED Notes (Signed)
PT IS LOOKING TO SPEAK WITH MD ABOUT EITHER ADMISSION OR DISCHARGE.

## 2023-04-23 NOTE — ED Notes (Signed)
PT at bedside.

## 2023-04-24 ENCOUNTER — Inpatient Hospital Stay (HOSPITAL_COMMUNITY): Payer: Medicare Other

## 2023-04-24 LAB — BASIC METABOLIC PANEL
Anion gap: 6 (ref 5–15)
BUN: 16 mg/dL (ref 8–23)
CO2: 25 mmol/L (ref 22–32)
Calcium: 8.7 mg/dL — ABNORMAL LOW (ref 8.9–10.3)
Chloride: 110 mmol/L (ref 98–111)
Creatinine, Ser: 0.69 mg/dL (ref 0.44–1.00)
GFR, Estimated: >60 mL/min (ref >60)
Glucose, Bld: 97 mg/dL (ref 70–99)
Potassium: 4.3 mmol/L (ref 3.5–5.1)
Sodium: 138 mmol/L (ref 135–145)

## 2023-04-24 LAB — CBC
HCT: 40.4 % (ref 36.0–46.0)
Hemoglobin: 13.2 g/dL (ref 12.0–15.0)
MCH: 30 pg (ref 26.0–34.0)
MCHC: 32.7 g/dL (ref 30.0–36.0)
MCV: 91.8 fL (ref 80.0–100.0)
Platelets: 176 10*3/uL (ref 150–400)
RBC: 4.4 MIL/uL (ref 3.87–5.11)
RDW: 13.2 % (ref 11.5–15.5)
WBC: 11.9 10*3/uL — ABNORMAL HIGH (ref 4.0–10.5)
nRBC: 0 % (ref 0.0–0.2)

## 2023-04-24 MED ORDER — LEVETIRACETAM 500 MG PO TABS: 500.0000 mg | ORAL_TABLET | Freq: Two times a day (BID) | ORAL | 0 refills | Status: DC

## 2023-04-24 MED ORDER — ACETAMINOPHEN 500 MG PO TABS: 1000.0000 mg | ORAL_TABLET | Freq: Four times a day (QID) | ORAL | Status: DC | PRN

## 2023-04-24 NOTE — ED Notes (Signed)
This RN reviewed discharge instructions with patient. She verbalized understanding and denied any further questions. Pt well appearing upon discharge and denies pain. Waiting for walker from case worker to be discharged.

## 2023-04-24 NOTE — ED Notes (Signed)
IP providers at bedside.

## 2023-04-24 NOTE — ED Notes (Signed)
Orthostatic VS: Pt denied SOB, CP, lightheadedness and dizziness throughout process.     04/24/23 0829 04/24/23 0831 04/24/23 0832  Vitals  BP (!) 151/83 (!) 144/88 (!) 131/90  MAP (mmHg) 102 103 103  BP Location Right Arm Right Arm Right Arm  BP Method Automatic Automatic Automatic  Patient Position (if appropriate) Lying Sitting Standing  Pulse Rate 60 61 65  Pulse Rate Source Monitor Monitor Monitor  ECG Heart Rate (!) 54 62 67  Resp 11 20 16

## 2023-04-24 NOTE — Discharge Summary (Signed)
Physician Discharge Summary  Patient ID: Katherine Donovan MRN: [id number] DOB/AGE: 09-Jul-1945 77 y.o.  Admit date: 04/22/2023 Discharge date: 04/24/2023  Discharge Diagnoses Syncope with fall  Skull base fractures SDH/SAH, stable   Consultants Internal medicine  Neurosurgery   Procedures None   HPI: Patient is a 77 y.o. female who presents to [facility] ED as Level 2 trauma via EMS from home where she lives with her husband. She was walking to the refrigerator at home when she abruptly fell to the ground striking her head on the refrigerator and then floor. Husband who accompanies her states she had a vacant look on her face prior to falling. She remembers walking towards the fridge then waking up on the floor. She complains of a headache. She had epistaxis initially as well as hematemesis and abdominal pain from swallowing blood. Last episode of hematemesis was here in the ED. No further nausea or abdominal pain currently. She denies neck pain. She denies pain or injury to any extremities. She denies vision changes. She had a similar episode of syncope 30 years ago with negative medical work up. Leading up to incident today she states she has felt to be in her normal state of health without any dizziness or symptoms of illness. Workup in the ED significant for skull base fractures with TBI. Admitted to trauma service.   Hospital Course: Neurosurgery consulted and recommended follow up [facility] which was stable and no acute surgical intervention. Internal medicine consulted for syncope workup which was largely negative, ECHO not able to be done expediently but patient has had an ECHO as recent as April and will follow up with PCP. Cleared for home by therapies and has 24/7 supervision. Discharged 04/24/23 in stable condition with follow up as outlined below.    PE: General: pleasant, WD, thin female who is laying in bed in NAD HEENT: Sclera are noninjected. EOMI.   Heart: regular, rate, and rhythm.    Lungs: Respiratory effort nonlabored MS: all 4 extremities are symmetrical with no cyanosis, clubbing, or edema. Neuro: non-focal exam  Psych: A&Ox3 with an appropriate affect.    Allergies as of 04/24/2023   No Known Allergies      Medication List     TAKE these medications    acetaminophen 500 MG tablet Commonly known as: TYLENOL Take 2 tablets (1,000 mg total) by mouth every 6 (six) hours as needed for mild pain (pain score 1-3), headache or fever.   Calcium Carbonate-Vitamin D 600-400 MG-UNIT tablet Take 1 tablet by mouth daily.   Glucosamine 750 MG Tabs Take 750 mg by mouth daily.   JOINT SUPPORT COMPLEX PO Take 1 tablet by mouth daily. [place] Joints-Cartilage-Bone Triple Action Joint Health; Undenatured Type II Collagen   levETIRAcetam 500 MG tablet Commonly known as: KEPPRA Take 1 tablet (500 mg total) by mouth 2 (two) times daily for 9 doses.   Multi-Vitamin tablet Take 1 tablet by mouth daily.   RA Probiotic Digestive Care Caps Take 1 capsule by mouth 3 (three) times a week.   tamoxifen 20 MG tablet Commonly known as: NOLVADEX Take 1 tablet (20 mg total) by mouth daily.               Durable Medical Equipment  (From admission, onward)           Start     Ordered   04/24/23 0729  For home use only DME Walker rolling  Once       Question Answer Comment  Walker: With 5 Inch Wheels   Patient needs a walker to treat with the following condition TBI (traumatic brain injury) (HCC)      04/24/23 0728   04/23/23 0000  For home use only DME 4 wheeled rolling walker with seat       Question:  Patient needs a walker to treat with the following condition  Answer:  Trauma   04/23/23 1401              Follow-up Information     [facility] Follow up.   Why: Office will contact you 24- 48 ( business) hours after discharge to arrange the initial visit Contact information: [address] [place]  [address] [phone]         [facility] Follow up.   Why: (AKA) [employer] Rolling Dan Humphreys will be delivered prior to discharge from the ER Contact information: [address] [place] [address] [phone]         [name], MD Follow up.   Specialty: Family Medicine Contact information: [address] [place] [address] [phone]         [name], MD Follow up.   Specialty: Neurosurgery Why: As needed Contact information: [address] [place] [address] [phone]                 Signed: Juliet Rude , [facility] 04/24/2023, 11:23 AM Please see Amion for pager number during day hours 7:00am-4:30pm

## 2023-04-24 NOTE — Progress Notes (Signed)
Patient ID: Katherine Donovan, female   DOB: 01-14-1946, 77 y.o.   MRN: [id number] Patient is awake and alert without complaints of headache and minimal neck stiffness.  Her motor function is good in the upper and lower extremities.  She is ready for discharge home and can be discharged home.  I can follow her up in 2 weeks time as an outpatient.

## 2023-04-24 NOTE — Progress Notes (Signed)
Physical Therapy Treatment Patient Details Name: Katherine Donovan MRN: [id number] DOB: Jul 16, 1945 Today's Date: 04/24/2023   History of Present Illness Pt is 77 yo admitted s/p fall with LOC in which she sustained skull fx with SDH/SAH (no intervention recommended per neurosurg).  PMH significant for breast CA and hyperlipidemia    PT Comments  Pt received in bed, discussed issues with L foot since bunion surgery and pt given some recommendations for mgmt as well as a link to MT pads that she can add to her shoe. Shoes donned for ambulation which helped her balance. Pt used RW and ambulated 200' with CGA, no dizziness or LOB. Discussed acute phase of brain recovery and rec for use of RW to prevent falls in this phase. Pt agreeable. Husband present end of session for education as well. Pt left in chair beside stretcher end of session and instructed to spend more time sitting up today. PT will continue to follow. BP 127/95 SPO2 93% on RA HR 70 bpm    If plan is discharge home, recommend the following: A little help with walking and/or transfers;A little help with bathing/dressing/bathroom   Can travel by private vehicle        Equipment Recommendations  Rolling walker (2 wheels)    Recommendations for Other Services       Precautions / Restrictions Precautions Precautions: Fall Restrictions Weight Bearing Restrictions: No     Mobility  Bed Mobility Overal bed mobility: Modified Independent                  Transfers Overall transfer level: Needs assistance Equipment used: Rolling walker (2 wheels) Transfers: Sit to/from Stand Sit to Stand: Supervision           General transfer comment: vc's for hand placement with use of RW    Ambulation/Gait Ambulation/Gait assistance: Contact guard assist Gait Distance (Feet): 200 Feet Assistive device: Rolling walker (2 wheels) Gait Pattern/deviations: Drifts right/left, Antalgic, Step-through pattern Gait velocity:  decreased Gait velocity interpretation: >2.62 ft/sec, indicative of community ambulatory   General Gait Details: donned shoes before ambulation which helped foot pain. No LOB with use of RW. Discussed recommendation to use RW consistently for next few weeks to prevent falls and protect head during acute phase of recovery   Stairs             Wheelchair Mobility     Tilt Bed    Modified Rankin (Stroke Patients Only)       Balance Overall balance assessment: Needs assistance Sitting-balance support: No upper extremity supported Sitting balance-Leahy Scale: Normal     Standing balance support: No upper extremity supported, During functional activity Standing balance-Leahy Scale: Good                              Cognition Arousal: Alert Behavior During Therapy: WFL for tasks assessed/performed Overall Cognitive Status: Within Functional Limits for tasks assessed                                 General Comments: eager to go home        Exercises      General Comments General comments (skin integrity, edema, etc.): pt given recommendations for mgmt of 2nd metatarsal head pain including no walking barefoot and given link to metatarsal pads that she can add to her shoes . Pt appreciative of  recs. After gait BP 127/95, HR 70 bpm, SPO2 93% on RA      Pertinent Vitals/Pain Pain Assessment Pain Assessment: Faces Faces Pain Scale: Hurts a little bit Pain Location: L foot Pain Descriptors / Indicators: Discomfort Pain Intervention(s): Monitored during session    Home Living                          Prior Function            PT Goals (current goals can now be found in the care plan section) Acute Rehab PT Goals Patient Stated Goal: go home PT Goal Formulation: With patient Time For Goal Achievement: 04/30/23 Potential to Achieve Goals: Good Progress towards PT goals: Progressing toward goals    Frequency    Min  1X/week      PT Plan      Donovan-evaluation              AM-PAC PT "6 Clicks" Mobility   Outcome Measure  Help needed turning from your back to your side while in a flat bed without using bedrails?: None Help needed moving from lying on your back to sitting on the side of a flat bed without using bedrails?: None Help needed moving to and from a bed to a chair (including a wheelchair)?: A Little Help needed standing up from a chair using your arms (e.g., wheelchair or bedside chair)?: A Little Help needed to walk in hospital room?: A Little Help needed climbing 3-5 steps with a railing? : A Little 6 Click Score: 20    End of Session Equipment Utilized During Treatment: Gait belt Activity Tolerance: Patient tolerated treatment well Patient left: with call bell/phone within reach;in chair;with family/visitor present Nurse Communication: Mobility status PT Visit Diagnosis: Unsteadiness on feet (R26.81)     Time: 8413-2440 PT Time Calculation (min) (ACUTE ONLY): 35 min  Charges:    $Gait Training: 8-22 mins $Therapeutic Activity: 8-22 mins PT General Charges $$ ACUTE PT VISIT: 1 Visit                     Katherine Donovan, PT  Acute Rehab Services Secure chat preferred Office [phone]    Lawana Chambers Krist Rosenboom 04/24/2023, 10:44 AM

## 2023-04-24 NOTE — ED Notes (Signed)
Pt received walker. PT well appearing upon discharge and denies pain. Pt wheeled to exit. Pt endorses ride home.

## 2023-04-24 NOTE — Consult Note (Signed)
HD#2 SUBJECTIVE:  Patient Summary: Katherine Donovan is a 77 y.o. female with a pertinent PMH of breast cancer s/p chemotherapy, radiation therapy, and lumpectomy who presented following a fall and is admitted for multiple skull fractures and syncope workup.   Overnight Events: None  Interim History: Patient was evaluated at bedside.  No new concerns at this time.  Patient states feeling well without any further episodes of syncope.  Was able to get up without dizziness or lightheadedness.  Denies any chest pain, shortness of breath, headache, nausea, vomiting.  OBJECTIVE:  Vital Signs: Vitals:   04/24/23 0700 04/24/23 0829 04/24/23 0831 04/24/23 0832  BP: 132/86 (!) 151/83 (!) 144/88 (!) 131/90  Pulse: (!) 51 60 61 65  Resp: 16 11 20 16   Temp: 97.7 F (36.5 C)     TempSrc: Oral     SpO2: 97% 97% 97% 98%  Weight:      Height:       Supplemental O2: Room Air SpO2: 98 %  Filed Weights   04/23/23 1708  Weight: 51.7 kg    No intake or output data in the 24 hours ending 04/24/23 0844  Net IO Since Admission: 4.74 mL [04/24/23 0844]  Physical Exam: General: Well-appearing female sitting up in bed, in no acute distress Cardiac: Regular rate and rhythm, no murmurs appreciated Pulmonary: Normal work of breathing on room air, lungs clear to auscultation bilaterally Neuro: Alert and oriented x 3 Psych: Pleasant mood and affect  Patient Lines/Drains/Airways Status     Active Line/Drains/Airways     Name Placement date Placement time Site Days   Peripheral IV 04/22/23 18 G Anterior;Right Forearm 04/22/23  0935  Forearm  1   Airway 02/04/22  --  -- 443            Pertinent Labs:    Latest Ref Rng & Units 04/24/2023    2:47 AM 04/23/2023    2:48 AM 04/22/2023    9:37 AM  CBC  WBC 4.0 - 10.5 K/uL 11.9  15.5  15.6   Hemoglobin 12.0 - 15.0 g/dL 96.0  45.4  09.8   Hematocrit 36.0 - 46.0 % 40.4  41.0  48.7   Platelets 150 - 400 K/uL 176  189  198        Latest Ref  Rng & Units 04/24/2023    2:47 AM 04/23/2023    2:48 AM 04/22/2023    9:37 AM  CMP  Glucose 70 - 99 mg/dL 97  119  147   BUN 8 - 23 mg/dL 16  20  16    Creatinine 0.44 - 1.00 mg/dL 8.29  5.62  1.30   Sodium 135 - 145 mmol/L 138  134  135   Potassium 3.5 - 5.1 mmol/L 4.3  4.3  4.3   Chloride 98 - 111 mmol/L 110  104  101   CO2 22 - 32 mmol/L 25  22  21    Calcium 8.9 - 10.3 mg/dL 8.7  8.7  9.3   Total Protein 6.5 - 8.1 g/dL   6.3   Total Bilirubin 0.3 - 1.2 mg/dL   0.8   Alkaline Phos 38 - 126 U/L   33   AST 15 - 41 U/L   41   ALT 0 - 44 U/L   21     No results for input(s): "GLUCAP" in the last 72 hours.   Pertinent Imaging: No results found.  ASSESSMENT/PLAN:  Assessment: Principal Problem:   Syncope  Plan: Syncope, in the setting of orthostatic hypotension Orthostatics improved today with readings of lying 151/83 to standing 131/90.  Patient denies any symptoms during the assessment.  Adrenal insufficiency less likely due to slightly elevated a.m. cortisol.  TSH was within normal limits.  Electrolytes unremarkable as well.  Workup pending repeat echocardiogram.  Last echocardiogram done in April without any significant changes from previous and no signs of aortic stenosis.  Less suspicious for cardiac etiology at this point.  Physical exam today unremarkable.  Discussed with patient and her husband regarding plan, patient elected to have echocardiogram completed outpatient with her PCP to order.  Patient states that she will contact her PCP tomorrow morning to arrange hospital follow-up and repeat echocardiogram.  Plan conveyed to primary surgery team this morning. -Encourage increased salt in diet and increase fluid intake -Continue with compression stockings -Outpatient echocardiogram with PCP follow up -Rolling walker at discharge  Subdural hematomas, subarachnoid hemorrhage, nondisplaced occipital bone fracture, nondisplaced right occipital condyle fracture, right petrous  apex fracture, probable fracture of right sphenoid sinus- Defer to surgery team for management.  History of breast cancer s/p chemotherapy, radiation therapy, lumpectomy- Continue home tamoxifen  Pain management- per surgery team.  Osteoporosis- prior DEXA in 09/2022 with T score -3.6. Prior compression fracture in July 2019. Previously on Fosamax for ~10 years, stopped recently when tamoxifen was started. Currently taking vitamin D and calcium. Consider restarting a bisphosphonate on discharge with PCP follow up.   IMTS to sign off.  Recommendations relayed to surgery team this morning.  Thank you for the consult.    Signature: Rana Snare, DO Internal Medicine Resident PGY-2 Pager: [phone] Please contact the on-call pager after 5 pm and on weekends at [phone].

## 2023-04-24 NOTE — ED Notes (Signed)
PT requesting to use the bathroom to void. Pt was unable to walk to bathroom d/t lightheadedness that she reports is likely due to "laying in bed for 2 days". Additionally, pt reports previous left foot surgical intervention prevents her from walking straight. Pt got back into the gurney and use bedpan to void.

## 2023-05-16 ENCOUNTER — Ambulatory Visit: Payer: Medicare Other | Attending: General Surgery

## 2023-05-16 VITALS — Wt 112.0 lb

## 2023-05-16 DIAGNOSIS — Z483 Aftercare following surgery for neoplasm: Secondary | ICD-10-CM | POA: Insufficient documentation

## 2023-05-16 NOTE — Therapy (Signed)
OUTPATIENT PHYSICAL THERAPY SOZO SCREENING NOTE   Patient Name: Katherine Donovan MRN: [id number] DOB:07/07/1945, 77 y.o., female Today's Date: 05/16/2023  PCP: [name], MD REFERRING PROVIDER: Emelia Loron, MD   PT End of Session - 05/16/23 [phone]     Visit Number 2   # uncahnged due to screen only   PT Start Time 0800    PT Stop Time 0805    PT Time Calculation (min) 5 min    Activity Tolerance Patient tolerated treatment well    Behavior During Therapy [facility] for tasks assessed/performed             Past Medical History:  Diagnosis Date   History of bunionectomy 09/02/2020   Hyperlipidemia    Hyperlipidemia    left breast ILC 09/15/21   Osteoporosis    Personal history of chemotherapy    Personal history of radiation therapy    Rosacea    to face   Past Surgical History:  Procedure Laterality Date   BREAST LUMPECTOMY Left 2023   BREAST LUMPECTOMY WITH RADIOACTIVE SEED AND SENTINEL LYMPH NODE BIOPSY Left 02/04/2022   Procedure: LEFT BREAST SEED GUIDED LUMPECTOMY AND LEFT AXILLARY SENTINEL NODE BIOPSY;  Surgeon: Emelia Loron, MD;  Location: [facility];  Service: General;  Laterality: Left;  LMA & PEC BLOCK   PORTACATH PLACEMENT Right 10/06/2021   Procedure: INSERTION PORT-A-CATH;  Surgeon: Emelia Loron, MD;  Location: [facility];  Service: General;  Laterality: Right;   SIGMOIDOSCOPY  07/05/1997   TUBAL LIGATION     Patient Active Problem List   Diagnosis Date Noted   Syncope 04/22/2023   Encounter for immunization 10/19/2021   Port-A-Cath in place 10/14/2021   Malignant neoplasm of upper-outer quadrant of left breast in female, estrogen receptor positive (HCC) 09/18/2021   Pure hypercholesterolemia 09/18/2015   Age-related osteoporosis with current pathological fracture with routine healing 09/18/2015    REFERRING DIAG: left breast cancer at risk for lymphedema  THERAPY DIAG: Aftercare following surgery  for neoplasm  PERTINENT HISTORY: Patient was diagnosed on 08/21/2021 with 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 It measures 3 cm and is located in the upper outer quadrant. It is ER positive, PR negative and HER2 positive with a Ki67 of 15%. Neoadjuvant chemotherapy TCHP. Left lumpectomy on 02/04/22 with 6 negative lymph nodes. Will be having radiation. Has osteoporosis.     PRECAUTIONS: left UE Lymphedema risk, None  SUBJECTIVE: Pt returns for her 3 month L-Dex screen.   PAIN:  Are you having pain? No  SOZO SCREENING: Patient was assessed today using the SOZO machine to determine the lymphedema index score. This was compared to her baseline score. It was determined that she is within the recommended range when compared to her baseline and no further action is needed at this time. She will continue SOZO screenings. These are done every 3 months for 2 years post operatively followed by every 6 months for 2 years, and then annually.   L-DEX FLOWSHEETS - 05/16/23 0800       L-DEX LYMPHEDEMA SCREENING   Measurement Type Unilateral    L-DEX MEASUREMENT EXTREMITY Upper Extremity    POSITION  Standing    DOMINANT SIDE Right    At Risk Side Left    BASELINE SCORE (UNILATERAL) 2.6    L-DEX SCORE (UNILATERAL) 1.1    VALUE CHANGE (UNILAT) -1.5               Hermenia Bers,  PTA 05/16/2023, 8:04 AM

## 2023-05-17 ENCOUNTER — Other Ambulatory Visit: Payer: Self-pay

## 2023-05-18 ENCOUNTER — Encounter: Payer: Self-pay | Admitting: Podiatry

## 2023-05-18 ENCOUNTER — Ambulatory Visit (INDEPENDENT_AMBULATORY_CARE_PROVIDER_SITE_OTHER): Payer: Medicare Other

## 2023-05-18 ENCOUNTER — Ambulatory Visit (INDEPENDENT_AMBULATORY_CARE_PROVIDER_SITE_OTHER): Payer: Medicare Other | Admitting: Podiatry

## 2023-05-18 VITALS — Ht 61.0 in | Wt 112.0 lb

## 2023-05-18 DIAGNOSIS — R600 Localized edema: Secondary | ICD-10-CM | POA: Diagnosis not present

## 2023-05-18 DIAGNOSIS — M79672 Pain in left foot: Secondary | ICD-10-CM

## 2023-05-18 DIAGNOSIS — S92342A Displaced fracture of fourth metatarsal bone, left foot, initial encounter for closed fracture: Secondary | ICD-10-CM

## 2023-05-18 NOTE — Progress Notes (Signed)
Subjective:   Patient ID: Katherine Donovan, female   DOB: 77 y.o.   MRN: [id number]   HPI Patient presents stating she tripped over her left foot and it has been very sore and swollen and this occurred 3 weeks ago.  States that she had passed out and fell and is not sure but it was not addressed at the emergency room or hospital   ROS      Objective:  Physical Exam  Neurovascular status intact with quite a bit of forefoot edema left with inflammation pain mostly around the second and third fourth metatarsal heads negative Denna Haggard' sign is noted good digital perfusion     Assessment:  Probability for fracture of the second third and fourth metatarsals left foot with edematous process noted     Plan:  8 MP x-ray reviewed and I went ahead applied Unna boot Ace wrap today to try to help reduce swelling along with rigid immobilization and I dispensed surgical shoe.  Explained what to do with Unna boot and leave it on 3 days but take it off earlier if throbbing were to occur all questions answered  X-rays indicate significant fracture of the neck of the second third and fourth metatarsals left should heal uneventfully but will probably take 8-12 more weeks

## 2023-05-26 ENCOUNTER — Other Ambulatory Visit: Payer: Self-pay | Admitting: Family Medicine

## 2023-05-26 DIAGNOSIS — C50412 Malignant neoplasm of upper-outer quadrant of left female breast: Secondary | ICD-10-CM

## 2023-05-28 ENCOUNTER — Other Ambulatory Visit: Payer: Self-pay

## 2023-06-20 ENCOUNTER — Other Ambulatory Visit: Payer: Self-pay

## 2023-06-20 DIAGNOSIS — C50412 Malignant neoplasm of upper-outer quadrant of left female breast: Secondary | ICD-10-CM

## 2023-06-20 NOTE — Assessment & Plan Note (Addendum)
ILC, Stage IA, c(T1c, N0), ER+/PR-/HER2+, Grade 3  -found on screening mammogram. B/l MM and [place] on 09/01/21 showed a 2.9 cm left breast mass. Biopsy on 09/14/21 showed invasive lobular carcinoma, grade 2-3. -she completed 5 cycles neoadjuvant TCHP 10/07/21 - 12/30/21. Tolerated poorly overall with neuropathy, diarrhea, and low appetite. -left lumpectomy on 02/04/22 by Dr. Dwain Sarna showed complete pathological response, no residual carcinoma. -she completed maintenance trastuzumab in 09/2022.  -she has completed adjuvant RT -she is on Tamoxifen now, she is tolerating very well without significant side effects -Given node-negative disease, no significant benefit of adjuvant neratinib, and it was not recommended.  -She is clinically doing well, lab reviewed, exam was unremarkable, there is no clinical concern for recurrence -Continue cancer surveillance. -DEXA scan done 09/06/2022.  She has T-score of -3.6, indicating osteoporosis.  She has a 10-year risk of major osteoporotic fracture of 20% and a 10-year hip fracture probability of 3%. -She does take calcium with vitamin D every day. -Diagnostic mammogram scheduled for 09/2023.

## 2023-06-20 NOTE — Progress Notes (Signed)
Patient Care Team: [name], MD as PCP - General (Family Medicine) Pershing Proud, RN as Oncology Nurse Navigator Donnelly Angelica, RN as Oncology Nurse Navigator Emelia Loron, MD as Consulting Physician (General Surgery) Malachy Mood, MD as Consulting Physician (Hematology) Dorothy Puffer, MD as Consulting Physician (Radiation Oncology) Pollyann Samples, NP as Nurse Practitioner (Nurse Practitioner)  Clinic Day:  06/21/2023  Referring physician: [name], MD  ASSESSMENT & PLAN:   Assessment & Plan: Malignant neoplasm of upper-outer quadrant of left breast in female, estrogen receptor positive (HCC) ILC, Stage IA, c(T1c, N0), ER+/PR-/HER2+, Grade 3  -found on screening mammogram. B/l MM and [place] on 09/01/21 showed a 2.9 cm left breast mass. Biopsy on 09/14/21 showed invasive lobular carcinoma, grade 2-3. -she completed 5 cycles neoadjuvant TCHP 10/07/21 - 12/30/21. Tolerated poorly overall with neuropathy, diarrhea, and low appetite. -left lumpectomy on 02/04/22 by Dr. Dwain Sarna showed complete pathological response, no residual carcinoma. -she completed maintenance trastuzumab in 09/2022.  -she has completed adjuvant RT -she is on Tamoxifen now, she is tolerating very well without significant side effects -Given node-negative disease, no significant benefit of adjuvant neratinib, and it was not recommended.  -She is clinically doing well, lab reviewed, exam was unremarkable, there is no clinical concern for recurrence -Continue cancer surveillance. -DEXA scan done 09/06/2022.  She has T-score of -3.6, indicating osteoporosis.  She has a 10-year risk of major osteoporotic fracture of 20% and a 10-year hip fracture probability of 3%. -She does take calcium with vitamin D every day. -Diagnostic mammogram scheduled for 09/2023.   Plan: Labs reviewed  -CBC showing WBC 7.4; Hgb 15.7; Hct 48.1; Plt 208; Anc 5.4 -CMP - K 5.3; glucose 96; BUN 17; Creatinine 0.89; eGFR >60; Ca 10.2;  LFTs normal.   -reviewed bone density test from 09/2022 which indicated osteoporosis, but also significant change in the T score of the L1 through L3 vertebrae, and the dual femur.  -Restart Fosamax at 35 mg weekly.  Repeat DEXA scan in 09/2024. Continue tamoxifen daily. Diagnostic mammogram already scheduled for 09/2023. Labs and follow-up in 6 months.  The patient understands the plans discussed today and is in agreement with them.  She knows to contact our office if she develops concerns prior to her next appointment.  I provided 25 minutes of face-to-face time during this encounter and > 50% was spent counseling as documented under my assessment and plan.    Carlean Jews, NP  [facility] - A DEPT OF [facility] [address] [place] [address] Dept: [phone] Dept Fax: [phone]   No orders of the defined types were placed in this encounter.     CHIEF COMPLAINT:  CC: Left breast cancer -estrogen receptor positive  Current Treatment: Tamoxifen started 04/2022  INTERVAL HISTORY:  Katherine Donovan is here today for repeat clinical assessment.  Last saw Dr. Mosetta Putt 12/20/2022.  She reports that "warm flashes" are becoming a little warmer.  They are also becoming a little more frequent.  She does have night sweats from time to time.  These are manageable.  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  Gradually resolving and left fingers.  Still present in left axillary region.  Manageable without medication.  No new concerns or complaints today.  She denies fevers or chills. She denies pain. Her appetite is good. Her weight has been stable.  I  have reviewed the past medical history, past surgical history, social history and family history with the patient and they are unchanged from previous note.  ALLERGIES:  has no known allergies.  MEDICATIONS:  Current Outpatient Medications   Medication Sig Dispense Refill   alendronate (FOSAMAX) 35 MG tablet Take 1 tablet (35 mg total) by mouth every 7 (seven) days. Take with a full glass of water on an empty stomach. 12 tablet 3   Calcium Carbonate-Vitamin D 600-400 MG-UNIT tablet Take 1 tablet by mouth daily.       Glucosamine 750 MG TABS Take 750 mg by mouth daily.     Lactobacillus Rhamnosus, GG, (RA PROBIOTIC DIGESTIVE CARE) CAPS Take 1 capsule by mouth 3 (three) times a week.     Misc Natural Products (JOINT SUPPORT COMPLEX PO) Take 1 tablet by mouth daily. [employer]-Cartilage-Bone Triple Action Joint Health; Undenatured Type II Collagen     Multiple Vitamin (MULTI-VITAMIN) tablet Take 1 tablet by mouth daily.     tamoxifen (NOLVADEX) 20 MG tablet Take 1 tablet (20 mg total) by mouth daily. 90 tablet 3   acetaminophen (TYLENOL) 500 MG tablet Take 2 tablets (1,000 mg total) by mouth every 6 (six) hours as needed for mild pain (pain score 1-3), headache or fever.     levETIRAcetam (KEPPRA) 500 MG tablet Take 1 tablet (500 mg total) by mouth 2 (two) times daily for 9 doses. 9 tablet 0   No current facility-administered medications for this visit.    HISTORY OF PRESENT ILLNESS:   Oncology History Overview Note   Cancer Staging  Malignant neoplasm of upper-outer quadrant of left breast in female, estrogen receptor positive (HCC) Staging form: Breast, AJCC 8th Edition - Clinical stage from 09/14/2021: Stage IA (cT1c, cN0, cM0, G3, ER+, PR-, HER2+) - Signed by Malachy Mood, MD on 09/22/2021    Malignant neoplasm of upper-outer quadrant of left breast in female, estrogen receptor positive (HCC)  09/01/2021 Mammogram   CLINICAL DATA:  Bilateral screening recall for a right breast asymmetry and left breast mass.   EXAM: DIGITAL DIAGNOSTIC BILATERAL MAMMOGRAM WITH TOMOSYNTHESIS AND CAD; ULTRASOUND LEFT BREAST LIMITED; ULTRASOUND RIGHT BREAST LIMITED  IMPRESSION: 1. There is a highly suspicious ill-defined mass in the  upper-outer left breast. The mass measures at least 2.9 cm, and possibly up to 3.8 cm. Accurate measurement is difficult due to its ill-defined insinuating appearance.   2.  No evidence of left axillary lymphadenopathy.   3. No persistent suspicious mammographic or targeted sonographic abnormalities in the right breast.   09/14/2021 Cancer Staging   Staging form: Breast, AJCC 8th Edition - Clinical stage from 09/14/2021: Stage IA (cT1c, cN0, cM0, G3, ER+, PR-, HER2+) - Signed by Malachy Mood, MD on 09/22/2021 Stage prefix: Initial diagnosis Histologic grading system: 3 grade system   09/14/2021 Initial Biopsy   Diagnosis Breast, left, needle core biopsy, 1 o'clock, 4cmfn - INVASIVE MAMMARY CARCINOMA. SEE NOTE Diagnosis Note Carcinoma measures 1.3 cm in greatest linear dimension and appears grade 2-3.  Immunohistochemical stain for E-cadherin is negative, consistent with a lobular phenotype.  PROGNOSTIC INDICATORS Results: The tumor cells are POSITIVE for Her2 (3+). Estrogen Receptor: 50%, POSITIVE, MODERATE-WEAK STAINING INTENSITY Progesterone Receptor: 0%, NEGATIVE Proliferation Marker Ki67: 15%   09/18/2021 Initial Diagnosis   Malignant neoplasm of upper-outer quadrant of left breast in female, estrogen receptor positive (HCC)   09/21/2021 Imaging   EXAM: BILATERAL BREAST MRI WITH AND WITHOUT CONTRAST  IMPRESSION: 1. 3 cm biopsy-proven malignancy within the  UPPER-OUTER LEFT breast. No evidence of multifocal, multicentric or contralateral malignancy. No abnormal lymph nodes.   10/07/2021 - 02/10/2022 Chemotherapy   Patient is on Treatment Plan : BREAST  Docetaxel + Carboplatin + Trastuzumab + Pertuzumab  (TCHP) q21d      10/16/2021 Genetic Testing   Negative genetic testing: no pathogenic variants detected in [facility] CancerNext-Expanded +RNAinsight Panel.  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 Report date is October 16, 2021.   The CancerNext-Expanded  gene panel offered by [facility] and includes sequencing, rearrangement, and RNA analysis for the following 77 genes: AIP, ALK, APC, ATM, AXIN2, BAP1, BARD1, BLM, BMPR1A, BRCA1, BRCA2, BRIP1, CDC73, CDH1, CDK4, CDKN1B, CDKN2A, CHEK2, CTNNA1, DICER1, FANCC, FH, FLCN, GALNT12, KIF1B, LZTR1, MAX, MEN1, MET, MLH1, MSH2, MSH3, MSH6, MUTYH, NBN, NF1, NF2, NTHL1, PALB2, PHOX2B, PMS2, POT1, PRKAR1A, PTCH1, PTEN, RAD51C, RAD51D, RB1, RECQL, RET, SDHA, SDHAF2, SDHB, SDHC, SDHD, SMAD4, SMARCA4, SMARCB1, SMARCE1, STK11, SUFU, TMEM127, TP53, TSC1, TSC2, VHL and XRCC2 (sequencing and deletion/duplication); EGFR, EGLN1, HOXB13, KIT, MITF, PDGFRA, POLD1, and POLE (sequencing only); EPCAM and GREM1 (deletion/duplication only).   02/04/2022 Definitive Surgery   Lumpectomy by Dr. Othelia Pulling. BREAST, LEFT, LUMPECTOMY:  - Hyaline fibrosis and focal calcifications with no evidence of residual carcinoma - complete therapeutic response  - Mild fibrocystic change  - See comment  B. LYMPH NODE, LEFT AXILLA, SENTINEL, EXCISION:  - Lymph node, negative for carcinoma (0/1)  C. LYMPH NODE, LEFT AXILLA, SENTINEL, EXCISION:  - Lymph node, negative for carcinoma (0/1)  D. LYMPH NODE, LEFT AXILLA, SENTINEL, EXCISION:  - Lymph node, negative for carcinoma (0/1)  E. LYMPH NODE, LEFT AXILLA, SENTINEL, EXCISION:  - Lymph node, negative for carcinoma (0/1)  F. LYMPH NODE, LEFT AXILLA, SENTINEL, EXCISION:  - Lymph node, negative for carcinoma (0/1)  G. LYMPH NODE, LEFT AXILLA, SENTINEL, EXCISION:  - Lymph node, negative for carcinoma (0/1)  H. BREAST, LEFT INFERIOR MARGIN, EXCISION:  - Hyaline fibrosis with no evidence of residual carcinoma  - Mild fibrocystic change with calcification   COMMENT:  The appropriate pathologic stage is ypT0 ypN0    02/04/2022 Cancer Staging   Staging form: Breast, AJCC 8th Edition - Pathologic stage from 02/04/2022: No Stage Recommended (ypT0, pN0(sn), cM0) - Signed by Pollyann Samples, NP on  02/10/2022 Stage prefix: Post-therapy Response to neoadjuvant therapy: Complete response Method of lymph node assessment: Sentinel lymph node biopsy   03/03/2022 - 08/19/2022 Chemotherapy   Patient is on Treatment Plan : BREAST Trastuzumab IV (8/6) or SQ (600) D1 q21d     03/23/2022 - 04/19/2022 Radiation Therapy   Intent: Curative Radiation Treatment Dates: 03/23/2022 through 04/19/2022 Site Technique Total Dose (Gy) Dose per Fx (Gy) Completed Fx Beam Energies  Breast, Left: Breast_L 3D 42.56/42.56 2.66 16/16 6XFFF  Breast, Left: Breast_L_Bst 3D 8/8 2 4/4 6X      04/2022 -  Anti-estrogen oral therapy   Began adjuvant Tamoxifen   06/16/2022 Survivorship   SCP delivered by Santiago Glad, NP   09/06/2022 Imaging   DEXA scan T-score of -3.6. This patient is considered osteoporotic according to World Health Organization [facility]) criteria.  The quality of the exam is good. L4 was excluded due to degenerative changes as noted on previous exams. Site Region Measured Date Measured Age YA BMD Significant CHANGE T-score AP Spine  L1-L3       11/06[age]20[age]         -3.8    0.743 g/cm2 AP  Spine  L1-L3       05/14/2020    72.6         -3.6    0.740 g/cm2   DualFemur Total Right 09/06/2022    76.9         -3.6    0.550 g/cm2 DualFemur Total Right 05/14/2020 72.6 -3.5 0.565 g/cm2 *   DualFemur Total Mean  09/06/2022    74.9         -3.6    0.559 g/cm2 DualFemur Total Mean 05/14/2020 72.6 -3.4 0.584 g/cm2 *   World Health Organization [facility]) criteria for post-menopausal, Caucasian Women: Normal       T-score at or above -1 SD Osteopenia   T-score between -1 and -2.5 SD Osteoporosis T-score at or below -2.5 SD   RECOMMENDATION: 1. All patients should optimize calcium and vitamin D intake. 2. Consider FDA-approved medical therapies in postmenopausal women and men aged 68 years and older, based on the following: a. A hip or vertebral (clinical or morphometric) fracture. b. T-score = -2.5 at the  femoral neck or spine after appropriate evaluation to exclude secondary causes. c. Low bone mass (T-score between -1.0 and -2.5 at the femoral neck or spine) and a 10-year probability of a hip fracture = 3% or a 10-year probability of a major osteoporosis-related fracture = 20% based on the [place]-adapted WHO algorithm. d. Clinician judgment and/or patient preferences may indicate treatment for people with 10-year fracture probabilities above or below these levels.   09/09/2022 -  Chemotherapy   Patient is on Treatment Plan : BREAST MAINTENANCE Trastuzumab IV (6) or SQ (600) D1 q21d X 11 Cycles         REVIEW OF SYSTEMS:   Constitutional: Denies fevers, chills or abnormal weight loss Eyes: Denies blurriness of vision Ears, nose, mouth, throat, and face: Denies mucositis or sore throat Respiratory: Denies cough, dyspnea or wheezes Cardiovascular: Denies palpitation, chest discomfort or lower extremity swelling Gastrointestinal:  Denies nausea, heartburn or change in bowel habits Skin: Denies abnormal skin rashes Lymphatics: Denies new lymphadenopathy or easy bruising Neurological:Denies numbness, tingling or new weaknesses Behavioral/Psych: Mood is stable, no new changes  All other systems were reviewed with the patient and are negative.   VITALS:   Today's Vitals   06/21/23 0834  BP: 138/88  Pulse: 67  Resp: 17  Temp: 98 F (36.7 C)  TempSrc: Oral  SpO2: 97%  Weight: 114 lb 6.4 oz (51.9 kg)   Body mass index is 21.62 kg/m.   Wt Readings from Last 3 Encounters:  06/21/23 114 lb 6.4 oz (51.9 kg)  05/18/23 112 lb (50.8 kg)  05/16/23 112 lb (50.8 kg)    Body mass index is 21.62 kg/m.  Performance status (ECOG): 1 - Symptomatic but completely ambulatory  PHYSICAL EXAM:   GENERAL:alert, no distress and comfortable SKIN: skin color, texture, turgor are normal, no rashes or significant lesions EYES: normal, Conjunctiva are pink and non-injected, sclera clear OROPHARYNX:no  exudate, no erythema and lips, buccal mucosa, and tongue normal  NECK: supple, thyroid normal size, non-tender, without nodularity LYMPH:  no palpable lymphadenopathy in the cervical, axillary or inguinal LUNGS: clear to auscultation and percussion with normal breathing effort HEART: regular rate & rhythm and no murmurs and no lower extremity edema ABDOMEN:abdomen soft, non-tender and normal bowel sounds Musculoskeletal:no cyanosis of digits and no clubbing  NEURO: alert & oriented x 3 with fluent speech, no focal motor/sensory deficits BREAST: Left breast with well-healed lumpectomy scars.  No  nipple inversion or discharge.  No new lumps or masses.  No breast tenderness.  Negative for axillary lymphadenopathy on the left.  Right breast is without nipple inversion or discharge.  There are no lumps or masses.  No breast tenderness.  Negative for axillary lymphadenopathy on the right.  LABORATORY DATA:  I have reviewed the data as listed    Component Value Date/Time   NA 140 06/21/2023 0739   K 5.3 (H) 06/21/2023 0739   CL 105 06/21/2023 0739   CO2 30 06/21/2023 0739   GLUCOSE 96 06/21/2023 0739   BUN 17 06/21/2023 0739   CREATININE 0.89 06/21/2023 0739   CALCIUM 10.2 06/21/2023 0739   PROT 6.4 (L) 06/21/2023 0739   ALBUMIN 4.3 06/21/2023 0739   AST 29 06/21/2023 0739   ALT 16 06/21/2023 0739   ALKPHOS 43 06/21/2023 0739   BILITOT 0.7 06/21/2023 0739   GFRNONAA >60 06/21/2023 0739    Lab Results  Component Value Date   WBC 7.4 06/21/2023   NEUTROABS 5.4 06/21/2023   HGB 15.7 (H) 06/21/2023   HCT 48.1 (H) 06/21/2023   MCV 94.7 06/21/2023   PLT 208 06/21/2023

## 2023-06-21 ENCOUNTER — Inpatient Hospital Stay: Payer: Medicare Other | Attending: Hematology

## 2023-06-21 ENCOUNTER — Encounter: Payer: Self-pay | Admitting: Nurse Practitioner

## 2023-06-21 ENCOUNTER — Inpatient Hospital Stay (HOSPITAL_BASED_OUTPATIENT_CLINIC_OR_DEPARTMENT_OTHER): Payer: Medicare Other | Admitting: Nurse Practitioner

## 2023-06-21 VITALS — BP 138/88 | HR 67 | Temp 98.0°F | Resp 17 | Wt 114.4 lb

## 2023-06-21 DIAGNOSIS — Z79810 Long term (current) use of selective estrogen receptor modulators (SERMs): Secondary | ICD-10-CM | POA: Diagnosis not present

## 2023-06-21 DIAGNOSIS — Z170 Estrogen receptor positive status [ER+]: Secondary | ICD-10-CM | POA: Diagnosis not present

## 2023-06-21 DIAGNOSIS — Z79899 Other long term (current) drug therapy: Secondary | ICD-10-CM | POA: Diagnosis not present

## 2023-06-21 DIAGNOSIS — G629 Polyneuropathy, unspecified: Secondary | ICD-10-CM | POA: Insufficient documentation

## 2023-06-21 DIAGNOSIS — Z1722 Progesterone receptor negative status: Secondary | ICD-10-CM | POA: Insufficient documentation

## 2023-06-21 DIAGNOSIS — C50412 Malignant neoplasm of upper-outer quadrant of left female breast: Secondary | ICD-10-CM

## 2023-06-21 DIAGNOSIS — N6489 Other specified disorders of breast: Secondary | ICD-10-CM | POA: Diagnosis not present

## 2023-06-21 LAB — CBC WITH DIFFERENTIAL (CANCER CENTER ONLY)
Abs Immature Granulocytes: 0.02 10*3/uL (ref 0.00–0.07)
Basophils Absolute: 0 10*3/uL (ref 0.0–0.1)
Basophils Relative: 0 %
Eosinophils Absolute: 0.2 10*3/uL (ref 0.0–0.5)
Eosinophils Relative: 3 %
HCT: 48.1 % — ABNORMAL HIGH (ref 36.0–46.0)
Hemoglobin: 15.7 g/dL — ABNORMAL HIGH (ref 12.0–15.0)
Immature Granulocytes: 0 %
Lymphocytes Relative: 14 %
Lymphs Abs: 1.1 10*3/uL (ref 0.7–4.0)
MCH: 30.9 pg (ref 26.0–34.0)
MCHC: 32.6 g/dL (ref 30.0–36.0)
MCV: 94.7 fL (ref 80.0–100.0)
Monocytes Absolute: 0.6 10*3/uL (ref 0.1–1.0)
Monocytes Relative: 8 %
Neutro Abs: 5.4 10*3/uL (ref 1.7–7.7)
Neutrophils Relative %: 75 %
Platelet Count: 208 10*3/uL (ref 150–400)
RBC: 5.08 MIL/uL (ref 3.87–5.11)
RDW: 13 % (ref 11.5–15.5)
WBC Count: 7.4 10*3/uL (ref 4.0–10.5)
nRBC: 0 % (ref 0.0–0.2)

## 2023-06-21 LAB — CMP (CANCER CENTER ONLY)
ALT: 16 U/L (ref 0–44)
AST: 29 U/L (ref 15–41)
Albumin: 4.3 g/dL (ref 3.5–5.0)
Alkaline Phosphatase: 43 U/L (ref 38–126)
Anion gap: 5 (ref 5–15)
BUN: 17 mg/dL (ref 8–23)
CO2: 30 mmol/L (ref 22–32)
Calcium: 10.2 mg/dL (ref 8.9–10.3)
Chloride: 105 mmol/L (ref 98–111)
Creatinine: 0.89 mg/dL (ref 0.44–1.00)
GFR, Estimated: >60 mL/min (ref >60)
Glucose, Bld: 96 mg/dL (ref 70–99)
Potassium: 5.3 mmol/L — ABNORMAL HIGH (ref 3.5–5.1)
Sodium: 140 mmol/L (ref 135–145)
Total Bilirubin: 0.7 mg/dL (ref <1.2)
Total Protein: 6.4 g/dL — ABNORMAL LOW (ref 6.5–8.1)

## 2023-06-21 MED ORDER — ALENDRONATE SODIUM 35 MG PO TABS: 35.0000 mg | ORAL_TABLET | ORAL | 3 refills | Status: DC

## 2023-06-23 ENCOUNTER — Other Ambulatory Visit: Payer: Self-pay

## 2023-08-15 ENCOUNTER — Ambulatory Visit: Payer: Medicare Other | Attending: General Surgery

## 2023-08-15 VITALS — Wt 114.5 lb

## 2023-08-15 DIAGNOSIS — Z483 Aftercare following surgery for neoplasm: Secondary | ICD-10-CM | POA: Insufficient documentation

## 2023-08-15 NOTE — Therapy (Signed)
 OUTPATIENT PHYSICAL THERAPY SOZO SCREENING NOTE   Patient Name: Katherine Donovan MRN: [id number] DOB:09/06/1945, 78 y.o., female Today's Date: 08/15/2023  PCP: Audria Leather, MD REFERRING PROVIDER: Enid Harry, MD   PT End of Session - 08/15/23 [phone]     Visit Number 2   # unchanged due to screen only   PT Start Time 0815    PT Stop Time 0818    PT Time Calculation (min) 3 min    Activity Tolerance Patient tolerated treatment well    Behavior During Therapy [facility] for tasks assessed/performed             Past Medical History:  Diagnosis Date   History of bunionectomy 09/02/2020   Hyperlipidemia    Hyperlipidemia    left breast ILC 09/15/21   Osteoporosis    Personal history of chemotherapy    Personal history of radiation therapy    Rosacea    to face   Past Surgical History:  Procedure Laterality Date   BREAST LUMPECTOMY Left 2023   BREAST LUMPECTOMY WITH RADIOACTIVE SEED AND SENTINEL LYMPH NODE BIOPSY Left 02/04/2022   Procedure: LEFT BREAST SEED GUIDED LUMPECTOMY AND LEFT AXILLARY SENTINEL NODE BIOPSY;  Surgeon: Enid Harry, MD;  Location: [facility];  Service: General;  Laterality: Left;  LMA & PEC BLOCK   PORTACATH PLACEMENT Right 10/06/2021   Procedure: INSERTION PORT-A-CATH;  Surgeon: Enid Harry, MD;  Location: [facility];  Service: General;  Laterality: Right;   SIGMOIDOSCOPY  07/05/1997   TUBAL LIGATION     Patient Active Problem List   Diagnosis Date Noted   Syncope 04/22/2023   Port-A-Cath in place 10/14/2021   Malignant neoplasm of upper-outer quadrant of left breast in female, estrogen receptor positive (HCC) 09/18/2021   Pure hypercholesterolemia 09/18/2015   Age-related osteoporosis with current pathological fracture with routine healing 09/18/2015    REFERRING DIAG: left breast cancer at risk for lymphedema  THERAPY DIAG: Aftercare following surgery for neoplasm  PERTINENT HISTORY: Patient  was diagnosed on 08/21/2021 with 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 It measures 3 cm and is located in the upper outer quadrant. It is ER positive, PR negative and HER2 positive with a Ki67 of 15%. Neoadjuvant chemotherapy TCHP. Left lumpectomy on 02/04/22 with 6 negative lymph nodes. Will be having radiation. Has osteoporosis.     PRECAUTIONS: left UE Lymphedema risk, None  SUBJECTIVE: Pt returns for her 3 month L-Dex screen.   PAIN:  Are you having pain? No  SOZO SCREENING: Patient was assessed today using the SOZO machine to determine the lymphedema index score. This was compared to her baseline score. It was determined that she is within the recommended range when compared to her baseline and no further action is needed at this time. She will continue SOZO screenings. These are done every 3 months for 2 years post operatively followed by every 6 months for 2 years, and then annually.   L-DEX FLOWSHEETS - 08/15/23 0800       L-DEX LYMPHEDEMA SCREENING   Measurement Type Unilateral    L-DEX MEASUREMENT EXTREMITY Upper Extremity    POSITION  Standing    DOMINANT SIDE Right    At Risk Side Left    BASELINE SCORE (UNILATERAL) 2.6    L-DEX SCORE (UNILATERAL) 3.9    VALUE CHANGE (UNILAT) 1.3               Denyce Flank, PTA 08/15/2023, 8:18 AM

## 2023-09-04 ENCOUNTER — Other Ambulatory Visit: Payer: Self-pay

## 2023-09-07 ENCOUNTER — Ambulatory Visit
Admission: RE | Admit: 2023-09-07 | Discharge: 2023-09-07 | Disposition: A | Discharge status: Home / Self Care | Payer: Medicare Other | Source: Ambulatory Visit | Attending: Family Medicine

## 2023-09-07 DIAGNOSIS — C50412 Malignant neoplasm of upper-outer quadrant of left female breast: Secondary | ICD-10-CM

## 2023-09-22 ENCOUNTER — Ambulatory Visit: Payer: Medicare Other | Admitting: Family Medicine

## 2023-09-22 ENCOUNTER — Encounter: Payer: Self-pay | Admitting: Family Medicine

## 2023-09-22 VITALS — BP 128/78 | HR 70 | Temp 98.0°F | Ht 62.0 in | Wt 115.2 lb

## 2023-09-22 DIAGNOSIS — M8000XD Age-related osteoporosis with current pathological fracture, unspecified site, subsequent encounter for fracture with routine healing: Secondary | ICD-10-CM

## 2023-09-22 DIAGNOSIS — R252 Cramp and spasm: Secondary | ICD-10-CM | POA: Diagnosis not present

## 2023-09-22 LAB — CK: Total CK: 66 U/L (ref 7–177)

## 2023-09-22 LAB — TSH: TSH: 1.47 u[IU]/mL (ref 0.35–5.50)

## 2023-09-22 LAB — VITAMIN B12: Vitamin B-12: 592 pg/mL (ref 211–911)

## 2023-09-22 NOTE — Patient Instructions (Signed)
 Try going off the Fosamax for a few week to see if there is any correlation between the medication and the muscle cramps. If there is a correlation, then we would consider switching to Prolia injections.

## 2023-09-22 NOTE — Progress Notes (Signed)
 New Patient Office Visit  Subjective   Patient ID: Katherine Donovan, female    DOB: [birth year]  Age: 78 y.o. MRN: [id number]  CC:  Chief Complaint  Patient presents with   Establish Care    HPI [name] presents to establish care Was previously seeing Dr. Gaspar Skeeters, last seen in November. Patient today is reporting a chronic history of leg cramps off and on for several years. She reports she doesn't think she has ever had work up for it. States that they took her off her cholesterol medication but the cramps have not resolved. She is already taking magnesium, 400 mg daily, states that she drinks a lot of water through the day and these two things are not helping. States that sometimes she can go a few weeks in between having these cramps.   Patient has a history of osteoporosis,. States that she used to be on 17 mh weekly, but after 5 years she was taken off of it,. Was told she completed treatment., then her next DEXA scan showed continued osteoporosis and she was put on 35 mg weekly. She reports that the muscle cramps may have worsened after she restarted the fosamax  Pt also has a history of breast CA, is currently in remission, stable on the tamoxifen.  I have reviewed the patient's medical history in detail and updated the computerized patient record.   Current Outpatient Medications  Medication Instructions   alendronate (FOSAMAX) 35 mg, Oral, Every 7 days, Take with a full glass of water on an empty stomach.   BIOTIN PO Daily   Calcium Carbonate-Vitamin D 600-400 MG-UNIT tablet 1 tablet, Daily   Lactobacillus Rhamnosus, GG, (RA PROBIOTIC DIGESTIVE CARE) CAPS 1 capsule, 3 times weekly   MAGNESIUM PO 400 mg, Daily   Misc Natural Products (JOINT SUPPORT COMPLEX PO) 1 tablet, Daily   Multiple Vitamin (MULTI-VITAMIN) tablet 1 tablet, Daily   tamoxifen (NOLVADEX) 20 mg, Oral, Daily   VITAMIN D PO 2,000 Units, Daily    Past Medical History:  Diagnosis Date   History of  bunionectomy 09/02/2020   Hyperlipidemia    Hyperlipidemia    left breast ILC 09/15/21   Osteoporosis    Personal history of chemotherapy    Personal history of radiation therapy    Rosacea    to face    Past Surgical History:  Procedure Laterality Date   BREAST LUMPECTOMY Left 2023   BREAST LUMPECTOMY WITH RADIOACTIVE SEED AND SENTINEL LYMPH NODE BIOPSY Left 02/04/2022   Procedure: LEFT BREAST SEED GUIDED LUMPECTOMY AND LEFT AXILLARY SENTINEL NODE BIOPSY;  Surgeon: Emelia Loron, MD;  Location: [facility];  Service: General;  Laterality: Left;  LMA & PEC BLOCK   PORTACATH PLACEMENT Right 10/06/2021   Procedure: INSERTION PORT-A-CATH;  Surgeon: Emelia Loron, MD;  Location: [facility];  Service: General;  Laterality: Right;   SIGMOIDOSCOPY  07/05/1997   TUBAL LIGATION      Family History  Problem Relation Age of Onset   Melanoma Mother        or other skin cancer; dx after 50; no systemic therapy needed   Hypertension Father    Heart attack Father    Other Sister 32       benign breast mass   Obesity Sister    Other Sister 41       benign breast mass   Cancer Paternal Uncle        unknown type; mets; d. late 63s  Colon cancer Neg Hx     Social History   Socioeconomic History   Marital status: Married    Spouse name: Not on file   Number of children: 0   Years of education: Not on file   Highest education level: Some college, no degree  Occupational History   Not on file  Tobacco Use   Smoking status: Former    Types: Cigarettes   Smokeless tobacco: Never   Tobacco comments:    Never really liked or began a habit.  Vaping Use   Vaping status: Never Used  Substance and Sexual Activity   Alcohol use: Yes    Alcohol/week: 3.0 - 5.0 standard drinks of alcohol    Types: 3 - 5 Glasses of wine per week    Comment: wine social   Drug use: No   Sexual activity: Yes    Birth control/protection: None, Post-menopausal     Comment: Age/tubes tied  Other Topics Concern   Not on file  Social History Narrative   Not on file   Social Drivers of Health   Financial Resource Strain: Low Risk  (09/16/2023)   Overall Financial Resource Strain (CARDIA)    Difficulty of Paying Living Expenses: Not hard at all  Food Insecurity: No Food Insecurity (09/16/2023)   Hunger Vital Sign    Worried About Running Out of Food in the Last Year: Never true    Ran Out of Food in the Last Year: Never true  Transportation Needs: No Transportation Needs (09/16/2023)   PRAPARE - Administrator, Civil Service (Medical): No    Lack of Transportation (Non-Medical): No  Physical Activity: Insufficiently Active (09/16/2023)   Exercise Vital Sign    Days of Exercise per Week: 4 days    Minutes of Exercise per Session: 30 min  Stress: No Stress Concern Present (09/16/2023)   [employer] of Occupational Health - Occupational Stress Questionnaire    Feeling of Stress : Not at all  Social Connections: Unknown (09/16/2023)   Social Connection and Isolation Panel [NHANES]    Frequency of Communication with Friends and Family: Once a week    Frequency of Social Gatherings with Friends and Family: Once a week    Attends Religious Services: Patient declined    Database administrator or Organizations: No    Attends Engineer, structural: Not on file    Marital Status: Married  Catering manager Violence: Not At Risk (04/22/2023)   Humiliation, Afraid, Rape, and Kick questionnaire    Fear of Current or Ex-Partner: No    Emotionally Abused: No    Physically Abused: No    Sexually Abused: No    Review of Systems  All other systems reviewed and are negative.       Objective   BP 128/78   Pulse 70   Temp 98 F (36.7 C) (Oral)   Ht 5\' 2"  (1.575 m)   Wt 115 lb 3.2 oz (52.3 kg)   SpO2 100%   BMI 21.07 kg/m   Physical Exam Vitals reviewed.  Constitutional:      Appearance: Normal appearance. She is  well-groomed and normal weight.  Eyes:     Conjunctiva/sclera: Conjunctivae normal.  Neck:     Thyroid: No thyromegaly.  Cardiovascular:     Rate and Rhythm: Normal rate and regular rhythm.     Pulses: Normal pulses.     Heart sounds: S1 normal and S2 normal.  Pulmonary:  Effort: Pulmonary effort is normal.     Breath sounds: Normal breath sounds and air entry.  Abdominal:     General: Bowel sounds are normal.  Musculoskeletal:     Right lower leg: No edema.     Left lower leg: No edema.  Neurological:     Mental Status: She is alert and oriented to person, place, and time. Mental status is at baseline.     Gait: Gait is intact.  Psychiatric:        Mood and Affect: Mood and affect normal.        Speech: Speech normal.        Behavior: Behavior normal.        Judgment: Judgment normal.         Assessment & Plan:  Nocturnal muscle cramps Assessment & Plan: Unclear etiology, will check TSH, CK and B12 to rule out these causes  Orders: -     TSH; Future -     CK; Future -     Vitamin B12; Future  Age-related osteoporosis with current pathological fracture with routine healing Assessment & Plan: Foasamx may be causing the muscle cramps at night. I advised that we may stop the fosamax to see if her cramps resolve if her labs are normal. We discussed other medications like prolia that could be used instead.     Return in about 8 months (around 05/25/2024) for AWV-- with me in person.   Karie Georges, MD

## 2023-09-23 ENCOUNTER — Other Ambulatory Visit: Payer: Self-pay

## 2023-09-23 ENCOUNTER — Encounter: Payer: Self-pay | Admitting: Family Medicine

## 2023-09-28 DIAGNOSIS — R252 Cramp and spasm: Secondary | ICD-10-CM | POA: Insufficient documentation

## 2023-09-28 NOTE — Assessment & Plan Note (Signed)
 Foasamx may be causing the muscle cramps at night. I advised that we may stop the fosamax to see if her cramps resolve if her labs are normal. We discussed other medications like prolia that could be used instead.

## 2023-09-28 NOTE — Assessment & Plan Note (Signed)
 Unclear etiology, will check TSH, CK and B12 to rule out these causes

## 2023-10-20 ENCOUNTER — Ambulatory Visit: Payer: Self-pay

## 2023-10-20 NOTE — Telephone Encounter (Signed)
  Chief Complaint: lightheadedness Symptoms: now off balance with walking, dizziness, nausea, fatigue , loose stools (mild) Frequency: Tuesday  Pertinent Negatives: Patient denies fever, bleeding Disposition: [] ED /[] Urgent Care (no appt availability in office) / [x] Appointment(In office/virtual)/ []  [facility]/ [] Home Care/ [] Refused Recommended Disposition /[] [facility]/ []  Follow-up with PCP Additional Notes: no appts available with PCP within 24 hours. Offered to look at other providers in practice. Pt stated she only wants to see PCP.  Copied from CRM [phone] [id number]. Topic: Clinical - Red Word Triage >> Oct 20, 2023 10:08 AM Clydene Darner H wrote: [facility] that prompted transfer to Nurse Triage: Patient called stating that for the past 3 days she has been experiencing dizziness, lightheadedness, and nausea whenever she stands up from bed. She also reports feeling very fatigued and having light diarrhea.  Callback Number: [phone] >> Oct 20, 2023 10:10 AM Rudell Corrigan wrote: Update Callback Number: [phone] Reason for Disposition  [1] MODERATE dizziness (e.g., interferes with normal activities) AND [2] has NOT been evaluated by doctor (or NP/PA) for this  (Exception: Dizziness caused by heat exposure, sudden standing, or poor fluid intake.)  Answer Assessment - Initial Assessment Questions 1. DESCRIPTION: "Describe your dizziness."     Lightheaded- woke her up on Tuesday (vertigo also) 2. LIGHTHEADED: "Do you feel lightheaded?" (e.g., somewhat faint, woozy, weak upon standing)     Off balance 3. VERTIGO: "Do you feel like either you or the room is spinning or tilting?" (i.e. vertigo)     Tuesday and Wednesday  4. SEVERITY: "How bad is it?"  "Do you feel like you are going to faint?" "Can you stand and walk?"   - MILD: Feels slightly dizzy, but walking normally.   - MODERATE: Feels unsteady when walking, but not falling; interferes with normal activities (e.g., school,  work).   - SEVERE: Unable to walk without falling, or requires assistance to walk without falling; feels like passing out now.      moderate 5. ONSET:  "When did the dizziness begin?"     Tuesday  6. AGGRAVATING FACTORS: "Does anything make it worse?" (e.g., standing, change in head position)     Standing  8. CAUSE: "What do you think is causing the dizziness?"     ? Diarrhea- unsure  10. OTHER SYMPTOMS: "Do you have any other symptoms?" (e.g., fever, chest pain, vomiting, diarrhea, bleeding)       Nausea, diarrhea, fatigue,mild loose stools  Protocols used: Dizziness - Lightheadedness-A-AH

## 2023-10-24 NOTE — Telephone Encounter (Signed)
 Spoke with the patient to follow up on the symptoms below.  Patient stated she is feeling about the same, states she will wake up during the night, feels very weak and can hardly get out of bed and has no energy also.  I apologized for the delay and advised the patient due to these symptoms she should be seen immediately at an urgent care or ER and if this occurs again the same recommendation applies and to not wait on a visit here in the office.  Patient was advised to seek treatment at the [facility] urgent care on [address] as this is closer to her home.

## 2023-11-14 ENCOUNTER — Ambulatory Visit: Payer: Medicare Other | Attending: General Surgery

## 2023-11-14 VITALS — Wt 115.5 lb

## 2023-11-14 DIAGNOSIS — Z483 Aftercare following surgery for neoplasm: Secondary | ICD-10-CM | POA: Insufficient documentation

## 2023-11-14 NOTE — Therapy (Signed)
 OUTPATIENT PHYSICAL THERAPY SOZO SCREENING NOTE   Patient Name: Katherine Donovan MRN: [id number] DOB:[birth year], 78 y.o., female Today's Date: 11/14/2023  PCP: Aida House, MD REFERRING PROVIDER: Enid Harry, MD   PT End of Session - 11/14/23 [phone]     Visit Number 2   # unchanged due to screen only   PT Start Time 0847    PT Stop Time 0852    PT Time Calculation (min) 5 min    Activity Tolerance Patient tolerated treatment well    Behavior During Therapy [facility] for tasks assessed/performed             Past Medical History:  Diagnosis Date   History of bunionectomy 09/02/2020   Hyperlipidemia    Hyperlipidemia    left breast ILC 09/15/21   Osteoporosis    Personal history of chemotherapy    Personal history of radiation therapy    Rosacea    to face   Past Surgical History:  Procedure Laterality Date   BREAST LUMPECTOMY Left 2023   BREAST LUMPECTOMY WITH RADIOACTIVE SEED AND SENTINEL LYMPH NODE BIOPSY Left 02/04/2022   Procedure: LEFT BREAST SEED GUIDED LUMPECTOMY AND LEFT AXILLARY SENTINEL NODE BIOPSY;  Surgeon: Enid Harry, MD;  Location: [facility];  Service: General;  Laterality: Left;  LMA & PEC BLOCK   PORTACATH PLACEMENT Right 10/06/2021   Procedure: INSERTION PORT-A-CATH;  Surgeon: Enid Harry, MD;  Location:  [facility];  Service: General;  Laterality: Right;   SIGMOIDOSCOPY  07/05/1997   TUBAL LIGATION     Patient Active Problem List   Diagnosis Date Noted   Nocturnal muscle cramps 09/28/2023   Syncope 04/22/2023   Port-A-Cath in place 10/14/2021   Malignant neoplasm of upper-outer quadrant of left breast in female, estrogen receptor positive (HCC) 09/18/2021   Pure hypercholesterolemia 09/18/2015   Age-related osteoporosis with current pathological fracture with routine healing 09/18/2015    REFERRING DIAG: left breast cancer at risk for lymphedema  THERAPY DIAG: Aftercare following surgery for  neoplasm  PERTINENT HISTORY: Patient was diagnosed on 08/21/2021 with 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 It measures 3 cm and is located in the upper outer quadrant. It is ER positive, PR negative and HER2 positive with a Ki67 of 15%. Neoadjuvant chemotherapy [facility]. Left lumpectomy on 02/04/22 with 6 negative lymph nodes. Will be having radiation. Has osteoporosis.     PRECAUTIONS: left UE Lymphedema risk, None  SUBJECTIVE: Pt returns for her 3 month L-Dex screen.   PAIN:  Are you having pain? No  SOZO SCREENING: Patient was assessed today using the SOZO machine to determine the lymphedema index score. This was compared to her baseline score. It was determined that she is within the recommended range when compared to her baseline and no further action is needed at this time. She will continue SOZO screenings. These are done every 3 months for 2 years post operatively followed by every 6 months for 2 years, and then annually.   L-DEX FLOWSHEETS - 11/14/23 0800       L-DEX LYMPHEDEMA SCREENING   Measurement Type Unilateral    L-DEX MEASUREMENT EXTREMITY Upper Extremity    POSITION  Standing    DOMINANT SIDE Right    At Risk Side Left    BASELINE SCORE (UNILATERAL) 2.6    L-DEX SCORE (UNILATERAL) 1.9    VALUE CHANGE (UNILAT) -0.7               Denyce Flank,  PTA 11/14/2023, 8:51 AM

## 2024-01-01 ENCOUNTER — Other Ambulatory Visit: Payer: Self-pay | Admitting: Nurse Practitioner

## 2024-01-01 DIAGNOSIS — C50412 Malignant neoplasm of upper-outer quadrant of left female breast: Secondary | ICD-10-CM

## 2024-01-01 NOTE — Progress Notes (Unsigned)
 Patient Care Team: Ozell Heron HERO, MD as PCP - General (Family Medicine) Glean Stephane BROCKS, RN (Inactive) as Oncology Nurse Navigator Tyree Nanetta SAILOR, RN as Oncology Nurse Navigator Ebbie Cough, MD as Consulting Physician (General Surgery) Lanny Callander, MD as Consulting Physician (Hematology) Dewey Rush, MD as Consulting Physician (Radiation Oncology) Burton, Lacie K, NP as Nurse Practitioner (Nurse Practitioner)  Clinic Day:  01/03/2024   ASSESSMENT & PLAN:   Assessment & Plan: Malignant neoplasm of upper-outer quadrant of left breast in female, estrogen receptor positive (HCC) ILC, Stage IA, c(T1c, N0), ER+/PR-/HER2+, Grade 3  -found on screening mammogram. B/l MM and US  on 09/01/21 showed a 2.9 cm left breast mass. Biopsy on 09/14/21 showed invasive lobular carcinoma, grade 2-3. -she completed 5 cycles neoadjuvant TCHP 10/07/21 - 12/30/21. Tolerated poorly overall with neuropathy, diarrhea, and low appetite. -left lumpectomy on 02/04/22 by Dr. Ebbie showed complete pathological response, no residual carcinoma. -she completed maintenance trastuzumab  in 09/2022.  -she has completed adjuvant RT -she is on Tamoxifen  now, she is tolerating very well without significant side effects -Given node-negative disease, no significant benefit of adjuvant neratinib, and it was not recommended.  -DEXA scan done 09/06/2022.  She has T-score of -3.6, indicating osteoporosis.  She has a 10-year risk of major osteoporotic fracture of 20% and a 10-year hip fracture probability of 3%. -She does take calcium with vitamin D every day. -Started trial of Fosamax  70 mg weekly in December 2024.  She had to stop due to recurring leg cramps.  Plans to start Prolia with her primary care provider on 01/09/2024. -Bilateral 3D diagnostic mammogram done on 09/07/2023 with benign results. - DEXA scan and bilateral 3D diagnostic mammogram ordered for March 2026. -Continue tamoxifen  20 mg daily. -Continue breast cancer  surveillance with labs and follow-up every 6 months and as needed.   Osteoporosis  Restarted her on fosamax  70 mg weekly. She had to stop, as she started to develop leg cramps. She states that there were some nights she would wake up 3 times due to the cramping in her leg. She states that her new primary care provider plans to start her on Prolia every 6 months. Her first appointment for this is coming up on Monday, 01/09/2024. She is due to have a new Bone Density test in March 2026. This was ordered during today's visit.   Plan Reviewed labs.  -unremarkable CBC and CMP. Benign 3D mammogram 09/07/2023. Recall expected in 09/2024.  3D diagnostic mammogram ordered for 09/2024 along with bone density test. Continue with tamoxifen  20 mg daily.  Continue breast cancer surveillance with labs and follow up in 6 months, sooner if needed.    The patient understands the plans discussed today and is in agreement with them.  She knows to contact our office if she develops concerns prior to her next appointment.  I provided 25 minutes of face-to-face time during this encounter and > 50% was spent counseling as documented under my assessment and plan.    Powell FORBES Lessen, NP  [facility] - A DEPT OF [facility] [address] [place] [address] Dept: [phone] Dept Fax: [phone]   Orders Placed This Encounter  Procedures   MM 3D DIAGNOSTIC MAMMOGRAM BILATERAL BREAST    Standing Status:   Future    Expected Date:   09/02/2024    Expiration Date:   01/02/2025    Scheduling Instructions:     Should  be on or after 09/08/2024. Please schedule with DEXA scan. Thanks you.    Reason for Exam (SYMPTOM  OR DIAGNOSIS REQUIRED):   breast cancer follow up    Preferred imaging location?:   [facility]   DG Bone Density    Standing Status:   Future    Expected Date:   09/02/2024    Expiration Date:   01/02/2025    Scheduling Instructions:      Please schedule along with 3D mammogram. Thanks    Reason for Exam (SYMPTOM  OR DIAGNOSIS REQUIRED):   osteoporisis. estrogen deficiency    Preferred imaging location?:   [facility]      CHIEF COMPLAINT:  CC: Left breast cancer, estrogen receptor positive  Current Treatment: Tamoxifen  20 mg daily starting 04/2022  INTERVAL HISTORY:  Lainy is here today for repeat clinical assessment.  She was last seen by me on 06/21/2023.  She had 3D diagnostic mammogram done 09/07/2023 which showed benign results overall.  Recall expected in 1 year for bilateral diagnostic mammogram.  Patient was started on Fosamax  70 mg weekly in December 2024.  A repeat bone density test is expected in 2026. She saw Dr. Ebbie in [place] 2024 and he said everything looks good.  The patient states she had to stop Fosamax  as she developed leg cramps, sometimes 3 times per night.  Would always wake her up.  She has talked to her primary care provider.  They plan to start Prolia injections every 6 months.  Hoping this new treatment starts on Monday, 01/09/2024.  The patient denies changes or new masses in the breast.  She does state that sometimes, the surgical scar in left axillary region gets tender.  She denies chest pain, chest pressure, or shortness of breath. She denies headaches or visual disturbances. She denies abdominal pain, nausea, vomiting, or changes in bowel or bladder habits.  She denies fevers or chills. She denies pain. Her appetite is good. Her weight has been stable.  I have reviewed the past medical history, past surgical history, social history and family history with the patient and they are unchanged from previous note.  ALLERGIES:  has no known allergies.  MEDICATIONS:  Current Outpatient Medications  Medication Sig Dispense Refill   alendronate  (FOSAMAX ) 35 MG tablet Take 1 tablet (35 mg total) by mouth every 7 (seven) days. Take with a full glass of water on an empty stomach. 12 tablet 3    BIOTIN PO Take by mouth daily.     Calcium Carbonate-Vitamin D 600-400 MG-UNIT tablet Take 1 tablet by mouth daily.       Lactobacillus Rhamnosus, GG, (RA PROBIOTIC DIGESTIVE CARE) CAPS Take 1 capsule by mouth 3 (three) times a week.     MAGNESIUM PO Take 400 mg by mouth daily.     Misc Natural Products (JOINT SUPPORT COMPLEX PO) Take 1 tablet by mouth daily. [employer]-Cartilage-Bone Triple Action Joint Health; Undenatured Type II Collagen     Multiple Vitamin (MULTI-VITAMIN) tablet Take 1 tablet by mouth daily.     VITAMIN D PO Take 2,000 Units by mouth daily.     tamoxifen  (NOLVADEX ) 20 MG tablet Take 1 tablet (20 mg total) by mouth daily. 90 tablet 3   No current facility-administered medications for this visit.    HISTORY OF PRESENT ILLNESS:   Oncology History Overview Note   Cancer Staging  Malignant neoplasm of upper-outer quadrant of left breast in female, estrogen receptor positive (HCC) Staging form: Breast, AJCC 8th  Edition - Clinical stage from 09/14/2021: Stage IA (cT1c, cN0, cM0, G3, ER+, PR-, HER2+) - Signed by Lanny Callander, MD on 09/22/2021    Malignant neoplasm of upper-outer quadrant of left breast in female, estrogen receptor positive (HCC)  09/01/2021 Mammogram   CLINICAL DATA:  Bilateral screening recall for a right breast asymmetry and left breast mass.   EXAM: DIGITAL DIAGNOSTIC BILATERAL MAMMOGRAM WITH TOMOSYNTHESIS AND CAD; ULTRASOUND LEFT BREAST LIMITED; ULTRASOUND RIGHT BREAST LIMITED  IMPRESSION: 1. There is a highly suspicious ill-defined mass in the upper-outer left breast. The mass measures at least 2.9 cm, and possibly up to 3.8 cm. Accurate measurement is difficult due to its ill-defined insinuating appearance.   2.  No evidence of left axillary lymphadenopathy.   3. No persistent suspicious mammographic or targeted sonographic abnormalities in the right breast.   09/14/2021 Cancer Staging   Staging form: Breast, AJCC 8th Edition - Clinical  stage from 09/14/2021: Stage IA (cT1c, cN0, cM0, G3, ER+, PR-, HER2+) - Signed by Lanny Callander, MD on 09/22/2021 Stage prefix: Initial diagnosis Histologic grading system: 3 grade system   09/14/2021 Initial Biopsy   Diagnosis Breast, left, needle core biopsy, 1 o'clock, 4cmfn - INVASIVE MAMMARY CARCINOMA. SEE NOTE Diagnosis Note Carcinoma measures 1.3 cm in greatest linear dimension and appears grade 2-3.  Immunohistochemical stain for E-cadherin is negative, consistent with a lobular phenotype.  PROGNOSTIC INDICATORS Results: The tumor cells are POSITIVE for Her2 (3+). Estrogen Receptor: 50%, POSITIVE, MODERATE-WEAK STAINING INTENSITY Progesterone Receptor: 0%, NEGATIVE Proliferation Marker Ki67: 15%   09/18/2021 Initial Diagnosis   Malignant neoplasm of upper-outer quadrant of left breast in female, estrogen receptor positive (HCC)   09/21/2021 Imaging   EXAM: BILATERAL BREAST MRI WITH AND WITHOUT CONTRAST  IMPRESSION: 1. 3 cm biopsy-proven malignancy within the UPPER-OUTER LEFT breast. No evidence of multifocal, multicentric or contralateral malignancy. No abnormal lymph nodes.   10/07/2021 - 02/10/2022 Chemotherapy   Patient is on Treatment Plan : BREAST  Docetaxel  + Carboplatin  + Trastuzumab  + Pertuzumab   (TCHP) q21d      10/16/2021 Genetic Testing   Negative genetic testing: no pathogenic variants detected in [facility] CancerNext-Expanded +RNAinsight Panel.  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 Report date is October 16, 2021.   The CancerNext-Expanded gene panel offered by [facility] and includes sequencing, rearrangement, and RNA analysis for the following 77 genes: AIP, ALK, APC, ATM, AXIN2, BAP1, BARD1, BLM, BMPR1A, BRCA1, BRCA2, BRIP1, CDC73, CDH1, CDK4, CDKN1B, CDKN2A, CHEK2, CTNNA1, DICER1, FANCC, FH, FLCN, GALNT12, KIF1B, LZTR1, MAX, MEN1, MET, MLH1, MSH2, MSH3, MSH6, MUTYH, NBN, NF1, NF2, NTHL1, PALB2, PHOX2B, PMS2, POT1, PRKAR1A, PTCH1, PTEN,  RAD51C, RAD51D, RB1, RECQL, RET, SDHA, SDHAF2, SDHB, SDHC, SDHD, SMAD4, SMARCA4, SMARCB1, SMARCE1, STK11, SUFU, TMEM127, TP53, TSC1, TSC2, VHL and XRCC2 (sequencing and deletion/duplication); EGFR, EGLN1, HOXB13, KIT, MITF, PDGFRA, POLD1, and POLE (sequencing only); EPCAM and GREM1 (deletion/duplication only).   02/04/2022 Definitive Surgery   Lumpectomy by Dr. Ebbie LABOR. BREAST, LEFT, LUMPECTOMY:  - Hyaline fibrosis and focal calcifications with no evidence of residual carcinoma - complete therapeutic response  - Mild fibrocystic change  - See comment  B. LYMPH NODE, LEFT AXILLA, SENTINEL, EXCISION:  - Lymph node, negative for carcinoma (0/1)  C. LYMPH NODE, LEFT AXILLA, SENTINEL, EXCISION:  - Lymph node, negative for carcinoma (0/1)  D. LYMPH NODE, LEFT AXILLA, SENTINEL, EXCISION:  - Lymph node, negative for carcinoma (0/1)  E. LYMPH NODE, LEFT AXILLA, SENTINEL, EXCISION:  - Lymph node, negative for  carcinoma (0/1)  F. LYMPH NODE, LEFT AXILLA, SENTINEL, EXCISION:  - Lymph node, negative for carcinoma (0/1)  G. LYMPH NODE, LEFT AXILLA, SENTINEL, EXCISION:  - Lymph node, negative for carcinoma (0/1)  H. BREAST, LEFT INFERIOR MARGIN, EXCISION:  - Hyaline fibrosis with no evidence of residual carcinoma  - Mild fibrocystic change with calcification   COMMENT:  The appropriate pathologic stage is ypT0 ypN0    02/04/2022 Cancer Staging   Staging form: Breast, AJCC 8th Edition - Pathologic stage from 02/04/2022: No Stage Recommended (ypT0, pN0(sn), cM0) - Signed by Burton, Lacie K, NP on 02/10/2022 Stage prefix: Post-therapy Response to neoadjuvant therapy: Complete response Method of lymph node assessment: Sentinel lymph node biopsy   03/03/2022 - 08/19/2022 Chemotherapy   Patient is on Treatment Plan : BREAST Trastuzumab  IV (8/6) or SQ (600) D1 q21d     03/23/2022 - 04/19/2022 Radiation Therapy   Intent: Curative Radiation Treatment Dates: 03/23/2022 through 04/19/2022 Site Technique Total  Dose (Gy) Dose per Fx (Gy) Completed Fx Beam Energies  Breast, Left: Breast_L 3D 42.56/42.56 2.66 16/16 6XFFF  Breast, Left: Breast_L_Bst 3D 8/8 2 4/4 6X      04/2022 -  Anti-estrogen oral therapy   Began adjuvant Tamoxifen    06/16/2022 Survivorship   SCP delivered by Lacie Burton, NP   09/06/2022 Imaging   DEXA scan T-score of -3.6. This patient is considered osteoporotic according to World Health Organization [facility]) criteria.  The quality of the exam is good. L4 was excluded due to degenerative changes as noted on previous exams. Site Region Measured Date Measured Age YA BMD Significant CHANGE T-score AP Spine  L1-L3       11/06[age]20[age]         -3.8    0.743 g/cm2 AP Spine  L1-L3       05/14/2020    72.6         -3.6    0.740 g/cm2   DualFemur Total Right 09/06/2022    74.9         -3.6    0.550 g/cm2 DualFemur Total Right 05/14/2020 72.6 -3.5 0.565 g/cm2 *   DualFemur Total Mean  09/06/2022    74.9         -3.6    0.559 g/cm2 DualFemur Total Mean 05/14/2020 72.6 -3.4 0.584 g/cm2 *   World Health Organization [facility]) criteria for post-menopausal, Caucasian Women: Normal       T-score at or above -1 SD Osteopenia   T-score between -1 and -2.5 SD Osteoporosis T-score at or below -2.5 SD   RECOMMENDATION: 1. All patients should optimize calcium and vitamin D intake. 2. Consider FDA-approved medical therapies in postmenopausal women and men aged 43 years and older, based on the following: a. A hip or vertebral (clinical or morphometric) fracture. b. T-score = -2.5 at the femoral neck or spine after appropriate evaluation to exclude secondary causes. c. Low bone mass (T-score between -1.0 and -2.5 at the femoral neck or spine) and a 10-year probability of a hip fracture = 3% or a 10-year probability of a major osteoporosis-related fracture = 20% based on the [place] -adapted WHO algorithm. d. Clinician judgment and/or patient preferences may indicate treatment for people with 10-year  fracture probabilities above or below these levels.   09/09/2022 -  Chemotherapy   Patient is on Treatment Plan : BREAST MAINTENANCE Trastuzumab  IV (6) or SQ (600) D1 q21d X 11 Cycles     09/07/2023 Mammogram   3D bilateral  diagnostic mammogram IMPRESSION: 1. No evidence of malignancy in BILATERAL breasts. 2. Stable post-treatment changes in the LEFT breast.   RECOMMENDATION: BILATERAL diagnostic mammogram in 12 months to continue with post-lumpectomy surveillance protocol. BI-RADS CATEGORY  2: Benign.       REVIEW OF SYSTEMS:   Constitutional: Denies fevers, chills or abnormal weight loss Eyes: Denies blurriness of vision Ears, nose, mouth, throat, and face: Denies mucositis or sore throat Respiratory: Denies cough, dyspnea or wheezes Cardiovascular: Denies palpitation, chest discomfort or lower extremity swelling Gastrointestinal:  Denies nausea, heartburn or change in bowel habits Skin: Denies abnormal skin rashes Lymphatics: Denies new lymphadenopathy or easy bruising Neurological:Denies numbness, tingling or new weaknesses Behavioral/Psych: Mood is stable, no new changes  All other systems were reviewed with the patient and are negative.   VITALS:   Today's Vitals   01/03/24 0811 01/03/24 0836  BP: 138/88   Pulse: 67   Resp: 17   Temp: 97.6 F (36.4 C)   SpO2: 97%   Weight: 116 lb 6.4 oz (52.8 kg)   PainSc:  0-No pain   Body mass index is 21.29 kg/m.    Wt Readings from Last 3 Encounters:  01/03/24 116 lb 6.4 oz (52.8 kg)  11/14/23 115 lb 8 oz (52.4 kg)  09/22/23 115 lb 3.2 oz (52.3 kg)    Body mass index is 21.29 kg/m.  Performance status (ECOG): 0 - Asymptomatic  PHYSICAL EXAM:   GENERAL:alert, no distress and comfortable SKIN: skin color, texture, turgor are normal, no rashes or significant lesions EYES: normal, Conjunctiva are pink and non-injected, sclera clear OROPHARYNX:no exudate, no erythema and lips, buccal mucosa, and tongue normal  NECK:  supple, thyroid  normal size, non-tender, without nodularity LYMPH:  no palpable lymphadenopathy in the cervical, axillary or inguinal LUNGS: clear to auscultation and percussion with normal breathing effort HEART: regular rate & rhythm and no murmurs and no lower extremity edema ABDOMEN:abdomen soft, non-tender and normal bowel sounds Musculoskeletal:no cyanosis of digits and no clubbing  NEURO: alert & oriented x 3 with fluent speech, no focal motor/sensory deficits BREAST: The right breast is without lumps or masses.  There is no nipple inversion or nipple discharge.  There is no axillary lymphadenopathy on the right.  There is well-healed surgical lumpectomy scar adjacent to lateral areola of the left breast.  There is generally fibrous tissue throughout the breast without discrete palpable mass or lump.  There is no nipple inversion or nipple discharge.  There is no axillary lymphadenopathy on the left.  There is a well-healed, dark-colored scar in the left mid axillary region.  LABORATORY DATA:  I have reviewed the data as listed    Component Value Date/Time   NA 138 01/03/2024 0735   K 4.3 01/03/2024 0735   CL 106 01/03/2024 0735   CO2 25 01/03/2024 0735   GLUCOSE 94 01/03/2024 0735   BUN 14 01/03/2024 0735   CREATININE 0.84 01/03/2024 0735   CALCIUM 9.4 01/03/2024 0735   PROT 6.5 01/03/2024 0735   ALBUMIN 4.0 01/03/2024 0735   AST 27 01/03/2024 0735   ALT 18 01/03/2024 0735   ALKPHOS 32 (L) 01/03/2024 0735   BILITOT 0.6 01/03/2024 0735   GFRNONAA >60 01/03/2024 0735    Lab Results  Component Value Date   WBC 6.3 01/03/2024   NEUTROABS 4.1 01/03/2024   HGB 14.9 01/03/2024   HCT 44.1 01/03/2024   MCV 91.3 01/03/2024   PLT 204 01/03/2024

## 2024-01-01 NOTE — Assessment & Plan Note (Signed)
 ILC, Stage IA, c(T1c, N0), ER+/PR-/HER2+, Grade 3  -found on screening mammogram. B/l MM and US  on 09/01/21 showed a 2.9 cm left breast mass. Biopsy on 09/14/21 showed invasive lobular carcinoma, grade 2-3. -she completed 5 cycles neoadjuvant TCHP 10/07/21 - 12/30/21. Tolerated poorly overall with neuropathy, diarrhea, and low appetite. -left lumpectomy on 02/04/22 by Dr. Ebbie showed complete pathological response, no residual carcinoma. -she completed maintenance trastuzumab  in 09/2022.  -she has completed adjuvant RT -she is on Tamoxifen  now, she is tolerating very well without significant side effects -Given node-negative disease, no significant benefit of adjuvant neratinib, and it was not recommended.  -She is clinically doing well, lab reviewed, exam was unremarkable, there is no clinical concern for recurrence -Continue cancer surveillance. -DEXA scan done 09/06/2022.  She has T-score of -3.6, indicating osteoporosis.  She has a 10-year risk of major osteoporotic fracture of 20% and a 10-year hip fracture probability of 3%. -She does take calcium with vitamin D every day. -Bilateral 3D diagnostic mammogram done on 09/07/2023 with benign results.

## 2024-01-03 ENCOUNTER — Inpatient Hospital Stay (HOSPITAL_BASED_OUTPATIENT_CLINIC_OR_DEPARTMENT_OTHER): Payer: Medicare Other | Admitting: Nurse Practitioner

## 2024-01-03 ENCOUNTER — Encounter: Payer: Self-pay | Admitting: Nurse Practitioner

## 2024-01-03 ENCOUNTER — Inpatient Hospital Stay: Payer: Medicare Other | Attending: Hematology

## 2024-01-03 VITALS — BP 138/88 | HR 67 | Temp 97.6°F | Resp 17 | Wt 116.4 lb

## 2024-01-03 DIAGNOSIS — R197 Diarrhea, unspecified: Secondary | ICD-10-CM | POA: Diagnosis not present

## 2024-01-03 DIAGNOSIS — N6311 Unspecified lump in the right breast, upper outer quadrant: Secondary | ICD-10-CM | POA: Insufficient documentation

## 2024-01-03 DIAGNOSIS — Z1731 Human epidermal growth factor receptor 2 positive status: Secondary | ICD-10-CM | POA: Diagnosis not present

## 2024-01-03 DIAGNOSIS — Z79810 Long term (current) use of selective estrogen receptor modulators (SERMs): Secondary | ICD-10-CM | POA: Diagnosis not present

## 2024-01-03 DIAGNOSIS — M810 Age-related osteoporosis without current pathological fracture: Secondary | ICD-10-CM | POA: Diagnosis not present

## 2024-01-03 DIAGNOSIS — C50412 Malignant neoplasm of upper-outer quadrant of left female breast: Secondary | ICD-10-CM

## 2024-01-03 DIAGNOSIS — Z1722 Progesterone receptor negative status: Secondary | ICD-10-CM | POA: Insufficient documentation

## 2024-01-03 DIAGNOSIS — G629 Polyneuropathy, unspecified: Secondary | ICD-10-CM | POA: Diagnosis not present

## 2024-01-03 DIAGNOSIS — Z170 Estrogen receptor positive status [ER+]: Secondary | ICD-10-CM

## 2024-01-03 DIAGNOSIS — N6489 Other specified disorders of breast: Secondary | ICD-10-CM | POA: Insufficient documentation

## 2024-01-03 DIAGNOSIS — Z79899 Other long term (current) drug therapy: Secondary | ICD-10-CM | POA: Insufficient documentation

## 2024-01-03 DIAGNOSIS — R252 Cramp and spasm: Secondary | ICD-10-CM | POA: Insufficient documentation

## 2024-01-03 DIAGNOSIS — M8000XD Age-related osteoporosis with current pathological fracture, unspecified site, subsequent encounter for fracture with routine healing: Secondary | ICD-10-CM

## 2024-01-03 LAB — CBC WITH DIFFERENTIAL (CANCER CENTER ONLY)
Abs Immature Granulocytes: 0.01 10*3/uL (ref 0.00–0.07)
Basophils Absolute: 0 10*3/uL (ref 0.0–0.1)
Basophils Relative: 1 %
Eosinophils Absolute: 0.1 10*3/uL (ref 0.0–0.5)
Eosinophils Relative: 1 %
HCT: 44.1 % (ref 36.0–46.0)
Hemoglobin: 14.9 g/dL (ref 12.0–15.0)
Immature Granulocytes: 0 %
Lymphocytes Relative: 22 %
Lymphs Abs: 1.4 10*3/uL (ref 0.7–4.0)
MCH: 30.8 pg (ref 26.0–34.0)
MCHC: 33.8 g/dL (ref 30.0–36.0)
MCV: 91.3 fL (ref 80.0–100.0)
Monocytes Absolute: 0.6 10*3/uL (ref 0.1–1.0)
Monocytes Relative: 10 %
Neutro Abs: 4.1 10*3/uL (ref 1.7–7.7)
Neutrophils Relative %: 66 %
Platelet Count: 204 10*3/uL (ref 150–400)
RBC: 4.83 MIL/uL (ref 3.87–5.11)
RDW: 12.6 % (ref 11.5–15.5)
WBC Count: 6.3 10*3/uL (ref 4.0–10.5)
nRBC: 0 % (ref 0.0–0.2)

## 2024-01-03 LAB — CMP (CANCER CENTER ONLY)
ALT: 18 U/L (ref 0–44)
AST: 27 U/L (ref 15–41)
Albumin: 4 g/dL (ref 3.5–5.0)
Alkaline Phosphatase: 32 U/L — ABNORMAL LOW (ref 38–126)
Anion gap: 7 (ref 5–15)
BUN: 14 mg/dL (ref 8–23)
CO2: 25 mmol/L (ref 22–32)
Calcium: 9.4 mg/dL (ref 8.9–10.3)
Chloride: 106 mmol/L (ref 98–111)
Creatinine: 0.84 mg/dL (ref 0.44–1.00)
GFR, Estimated: >60 mL/min (ref >60)
Glucose, Bld: 94 mg/dL (ref 70–99)
Potassium: 4.3 mmol/L (ref 3.5–5.1)
Sodium: 138 mmol/L (ref 135–145)
Total Bilirubin: 0.6 mg/dL (ref 0.0–1.2)
Total Protein: 6.5 g/dL (ref 6.5–8.1)

## 2024-01-03 MED ORDER — TAMOXIFEN CITRATE 20 MG PO TABS: 20.0000 mg | ORAL_TABLET | Freq: Every day | ORAL | 3 refills | Status: AC

## 2024-01-04 ENCOUNTER — Other Ambulatory Visit: Payer: Self-pay

## 2024-01-06 IMAGING — US US BREAST*R* LIMITED INC AXILLA
1 series · 8 of 8 positions shown · non-contrast
Comparison: Previous exam(s).

CLINICAL DATA: Bilateral screening recall for a right breast
asymmetry and left breast mass.



[Series 1: us breast*right* limited inc axilla · 0.07mm/px · 8 of 8 slices shown]
[im 1/8]
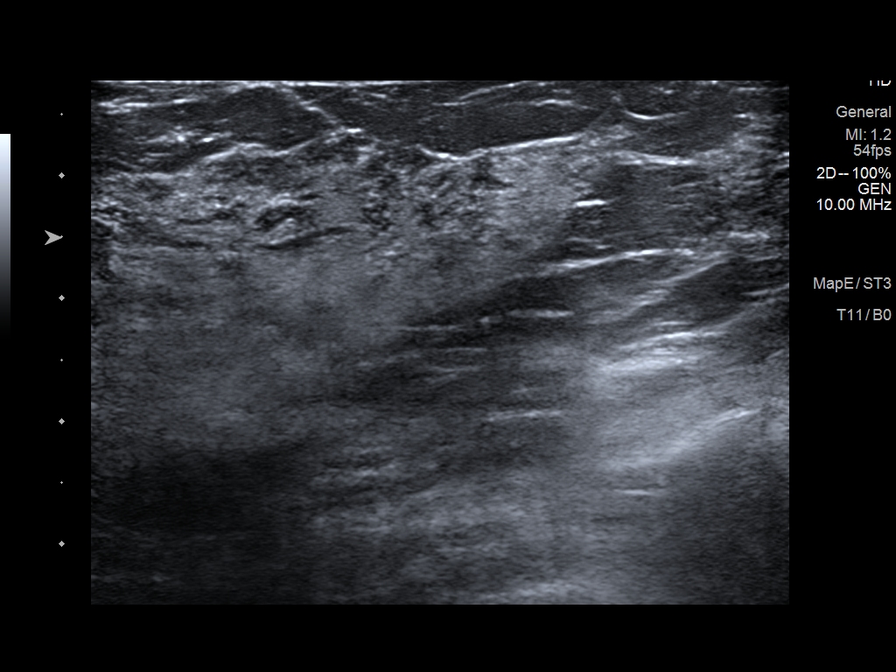
[im 2/8]
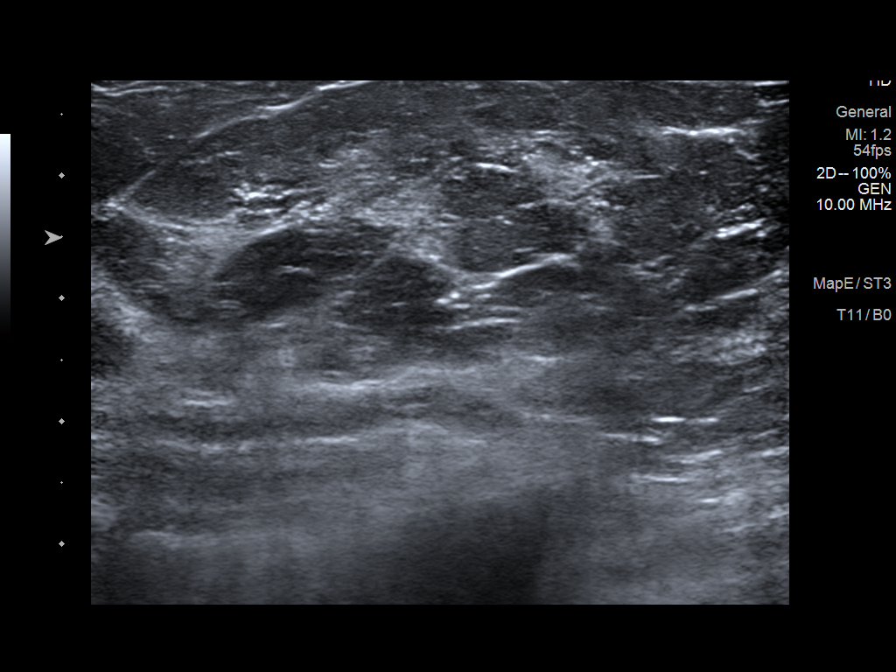
[im 3/8]
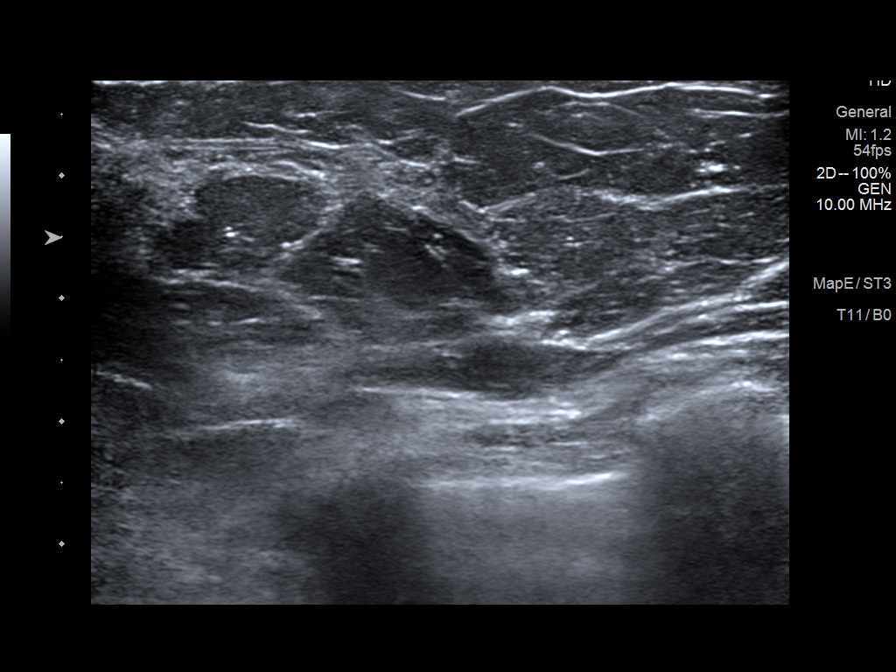
[im 4/8]
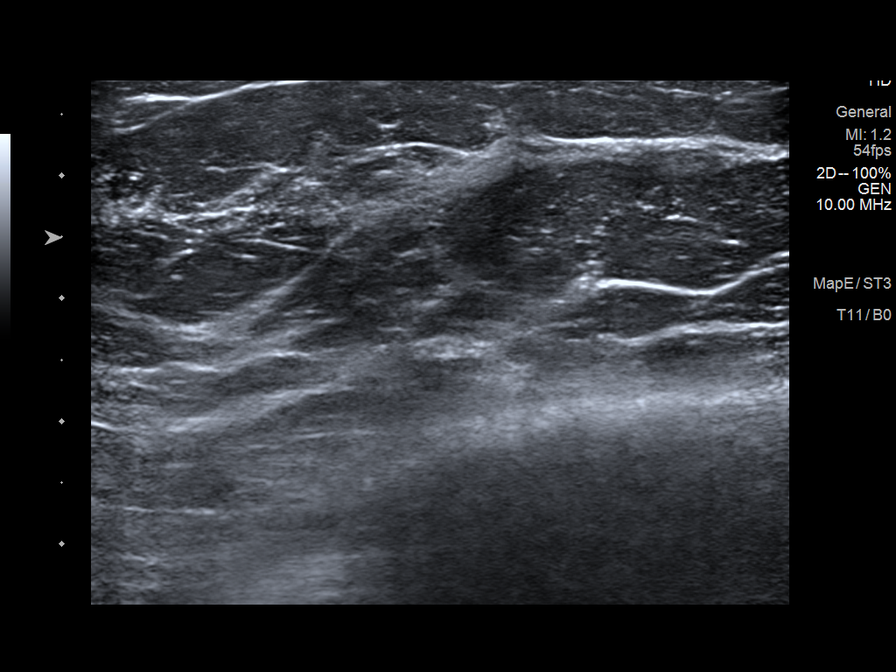
[im 5/8]
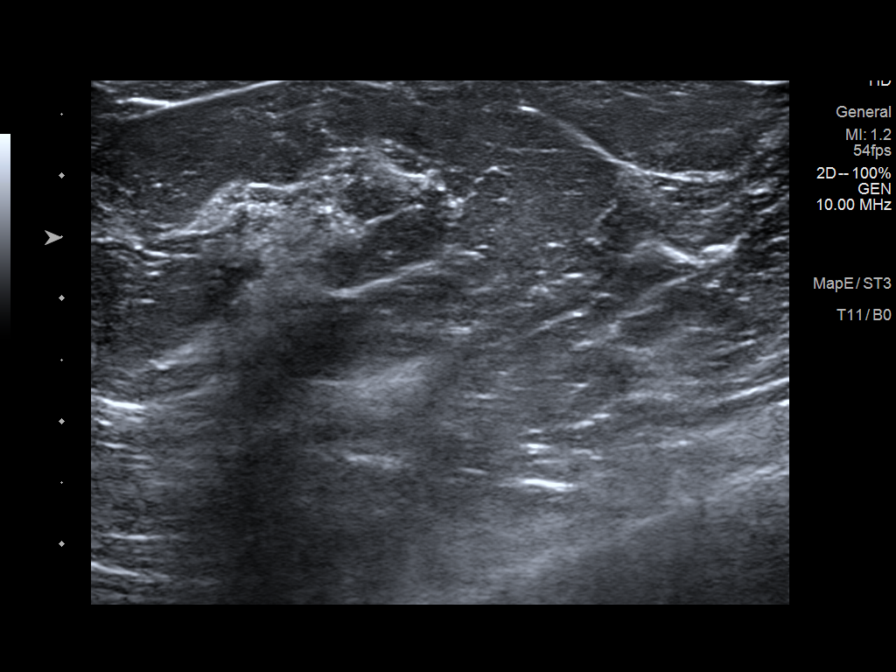
[im 6/8]
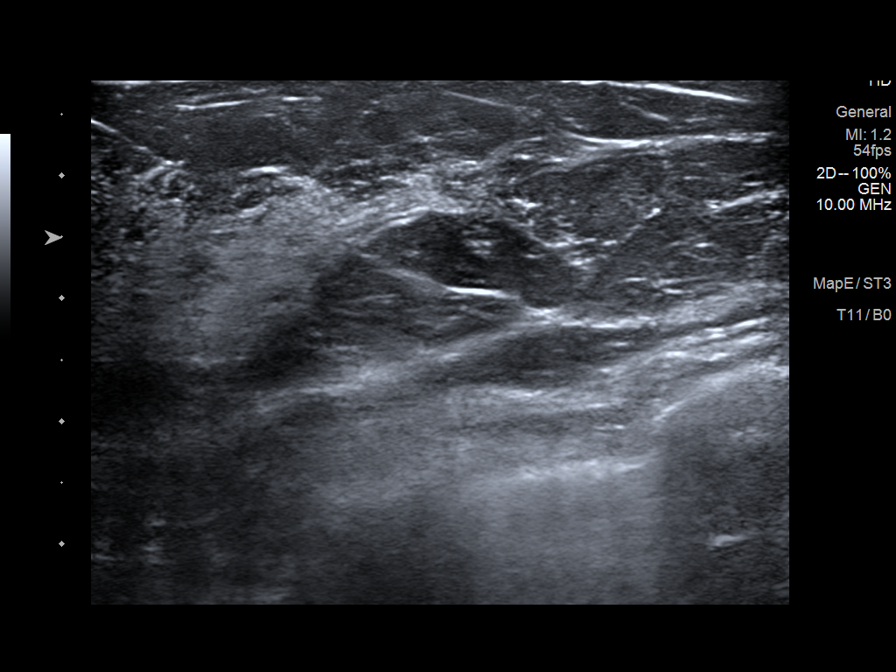
[im 7/8]
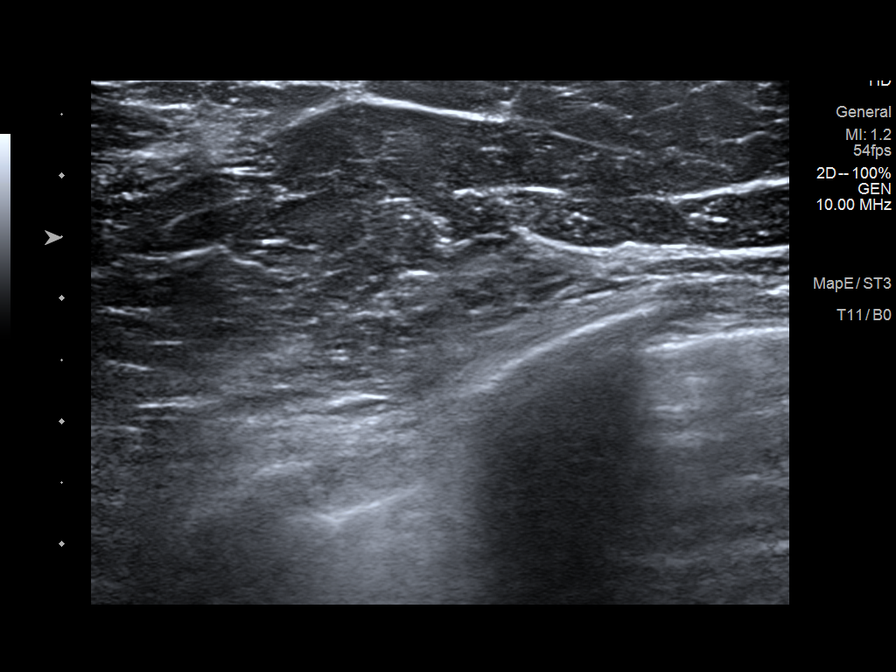
[im 8/8]
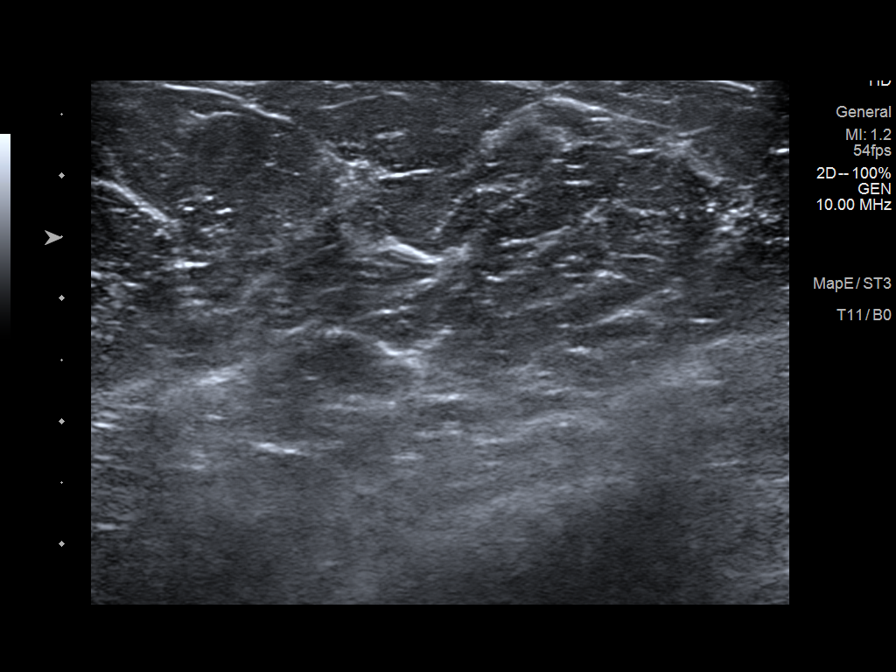

[8 of 8 positions shown; findings below may reference images not displayed]

ACR Breast Density Category c: The breast tissue is heterogeneously
dense, which may obscure small masses.
FINDINGS: Spot compression tomosynthesis images through the medial
retroareolar right breast demonstrates a nodular tissue pattern
without a discrete mass. Spot compression tomosynthesis images
through the upper outer left breast demonstrates a mass with
distortion spanning approximately 2.7 cm, though the margins are not
clearly defined and obscured within surrounding dense breast tissue.

Physical exam of the upper-outer left breast demonstrates a palpable
lump at approximately 1 o'clock.

Ultrasound of the retroareolar right breast demonstrates normal
fibroglandular tissue. No suspicious masses or areas of shadowing
are identified.

Ultrasound of the left breast at 1 o'clock, 4 cm from the nipple
demonstrates an ill-defined hypoechoic shadowing vascular mass. The
main portion of the mass measures approximately 2.9 x 2.1 x 2.7 cm.
There are hypoechoic tendrils extending in to the surrounding
tissue, which all included spans approximately 3.8 cm.

Ultrasound of the left axilla demonstrates multiple normal-appearing
lymph nodes.
IMPRESSION: 1. There is a highly suspicious ill-defined mass in the upper-outer
left breast. The mass measures at least 2.9 cm, and possibly up to
3.8 cm. Accurate measurement is difficult due to its ill-defined
insinuating appearance.

2.  No evidence of left axillary lymphadenopathy.

3. No persistent suspicious mammographic or targeted sonographic
abnormalities in the right breast.

RECOMMENDATION:
1. Ultrasound-guided biopsy is recommended for the left breast mass.
This has been scheduled for 09/14/2021 at [DATE] a.m.

2. Presuming the mass is malignant, and the patient is interested in
breast conservation, breast MRI is suggested to evaluate true extent
of disease.

I have discussed the findings and recommendations with the patient.
If applicable, a reminder letter will be sent to the patient
regarding the next appointment.

BI-RADS CATEGORY  5: Highly suggestive of malignancy.

## 2024-01-06 IMAGING — MG DIGITAL DIAGNOSTIC BILAT W/ TOMO W/ CAD
8 series · 8 of 24 positions shown · non-contrast
Comparison: Previous exam(s).

CLINICAL DATA: Bilateral screening recall for a right breast
asymmetry and left breast mass.



[L CC synth-2D]
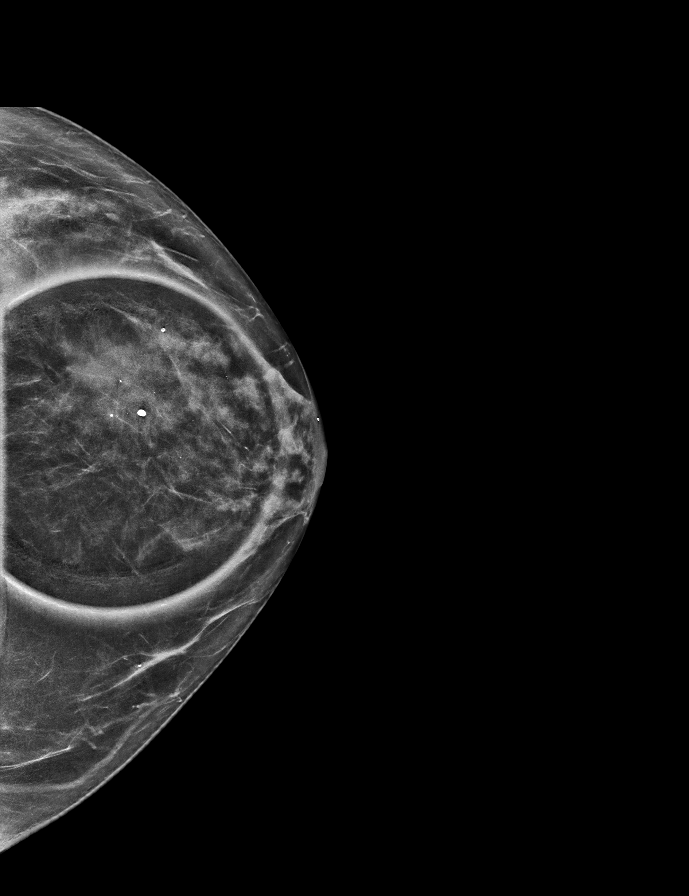

[R CC synth-2D]
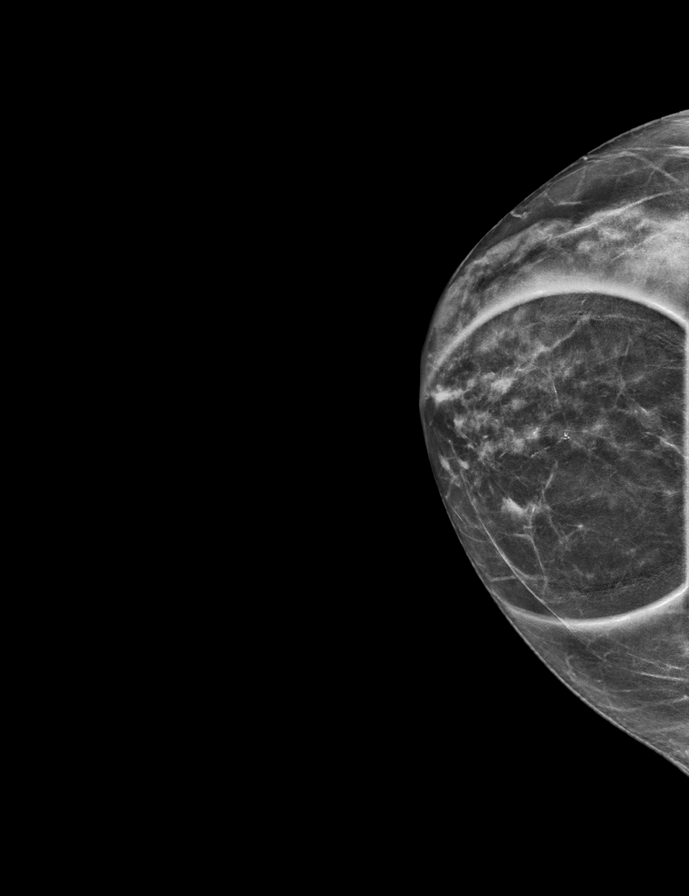

[R ML synth-2D]
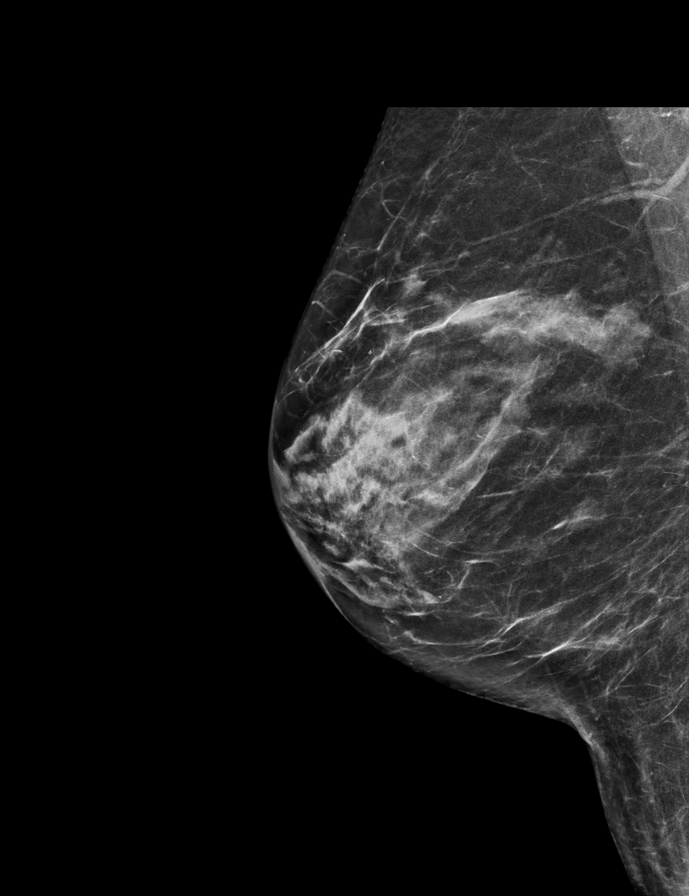

[L MLO synth-2D]
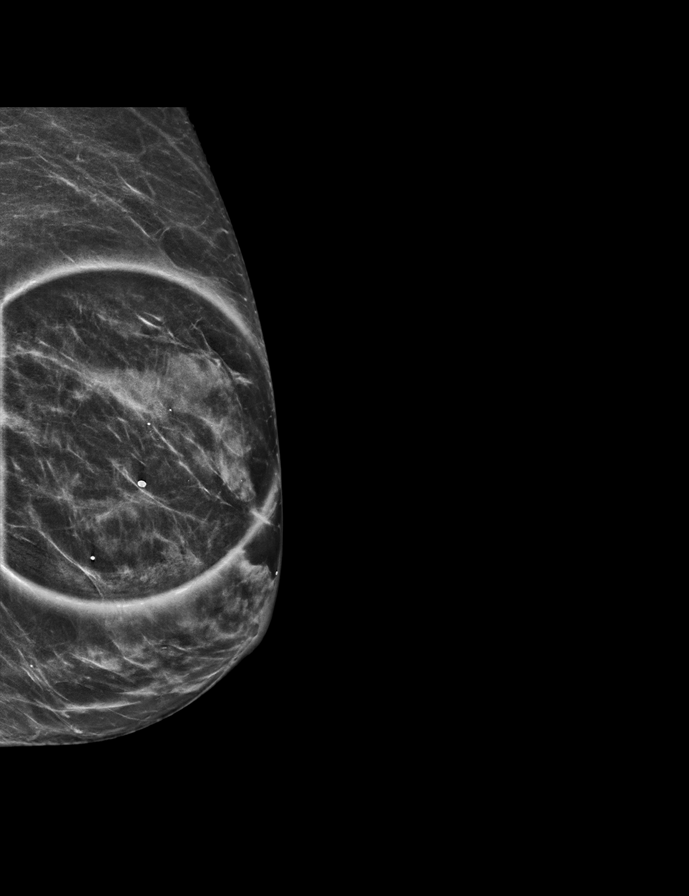

[R ML tomo · tomo slice 31/61.0]
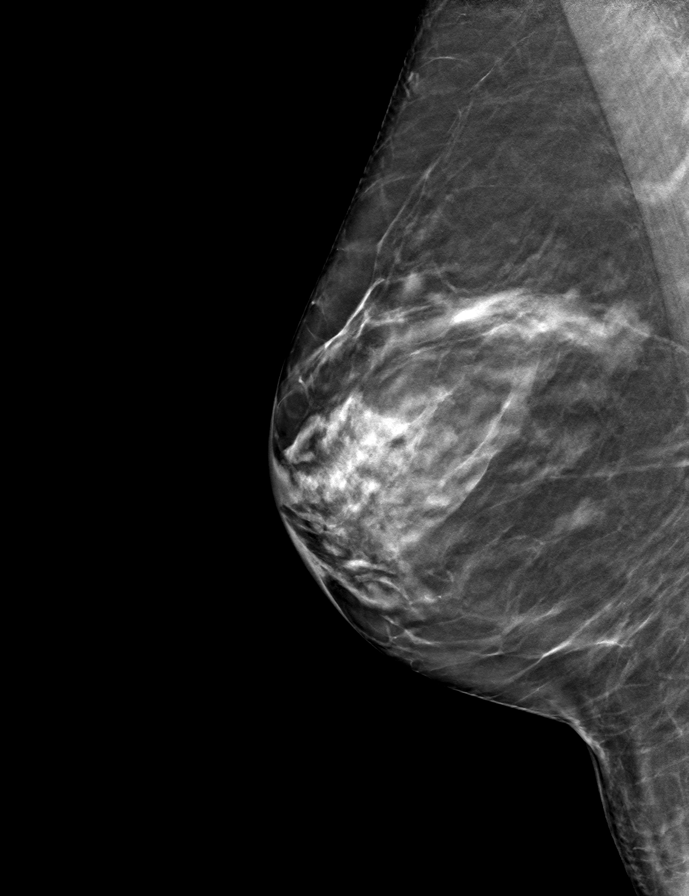

[L MLO tomo · tomo slice 31/61.0]
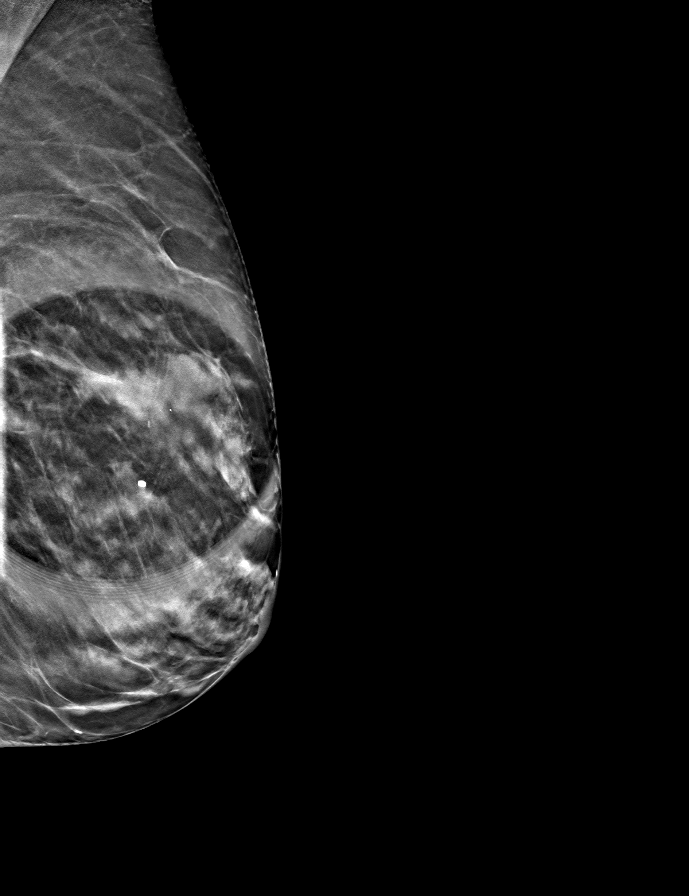

[L CC tomo · tomo slice 31/60.0]
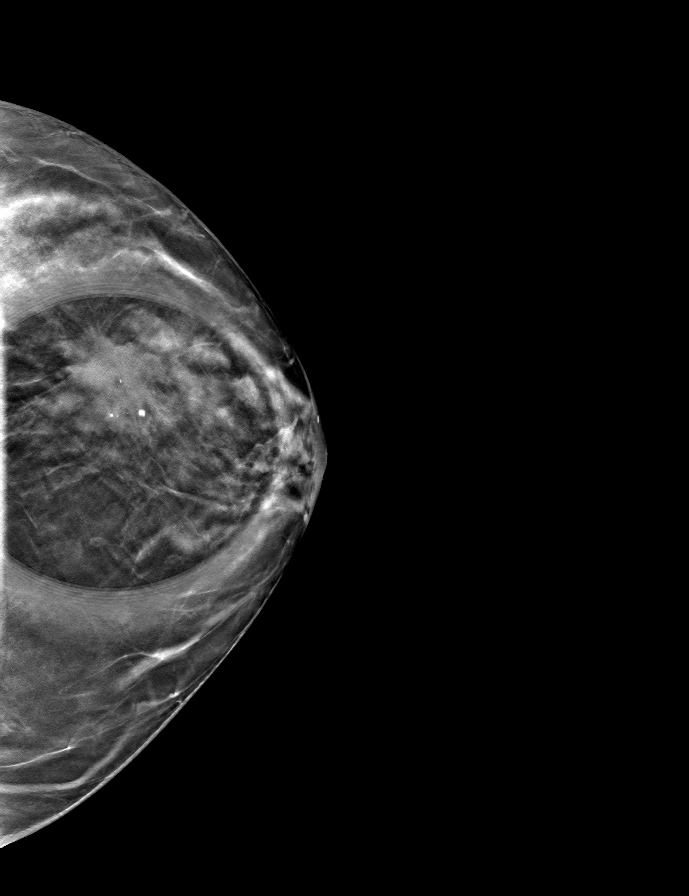

[R CC tomo · tomo slice 27/54.0]
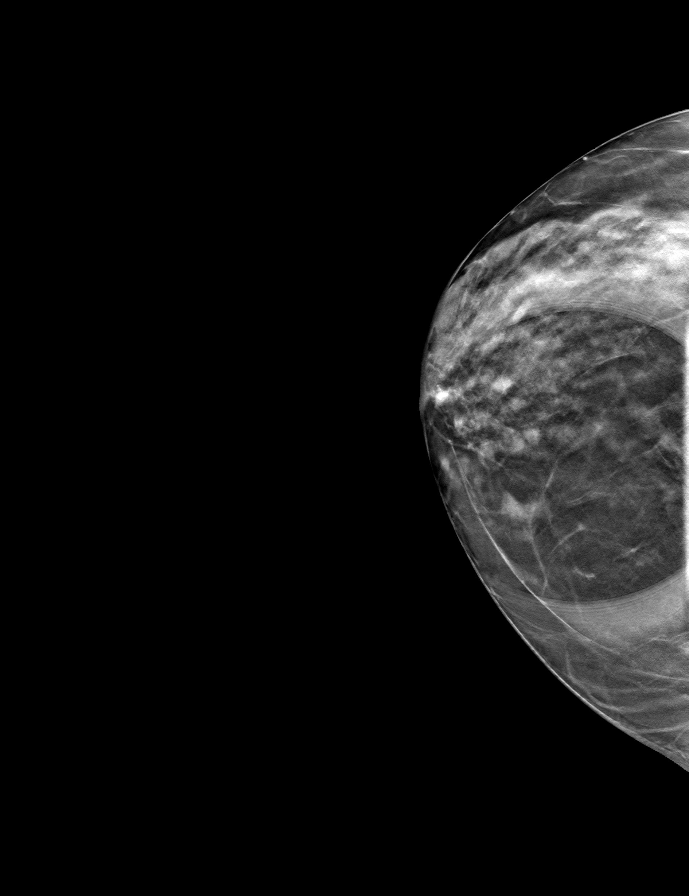

[8 of 24 positions shown; findings below may reference images not displayed]

ACR Breast Density Category c: The breast tissue is heterogeneously
dense, which may obscure small masses.
FINDINGS: Spot compression tomosynthesis images through the medial
retroareolar right breast demonstrates a nodular tissue pattern
without a discrete mass. Spot compression tomosynthesis images
through the upper outer left breast demonstrates a mass with
distortion spanning approximately 2.7 cm, though the margins are not
clearly defined and obscured within surrounding dense breast tissue.

Physical exam of the upper-outer left breast demonstrates a palpable
lump at approximately 1 o'clock.

Ultrasound of the retroareolar right breast demonstrates normal
fibroglandular tissue. No suspicious masses or areas of shadowing
are identified.

Ultrasound of the left breast at 1 o'clock, 4 cm from the nipple
demonstrates an ill-defined hypoechoic shadowing vascular mass. The
main portion of the mass measures approximately 2.9 x 2.1 x 2.7 cm.
There are hypoechoic tendrils extending in to the surrounding
tissue, which all included spans approximately 3.8 cm.

Ultrasound of the left axilla demonstrates multiple normal-appearing
lymph nodes.
IMPRESSION: 1. There is a highly suspicious ill-defined mass in the upper-outer
left breast. The mass measures at least 2.9 cm, and possibly up to
3.8 cm. Accurate measurement is difficult due to its ill-defined
insinuating appearance.

2.  No evidence of left axillary lymphadenopathy.

3. No persistent suspicious mammographic or targeted sonographic
abnormalities in the right breast.

RECOMMENDATION:
1. Ultrasound-guided biopsy is recommended for the left breast mass.
This has been scheduled for 09/14/2021 at [DATE] a.m.

2. Presuming the mass is malignant, and the patient is interested in
breast conservation, breast MRI is suggested to evaluate true extent
of disease.

I have discussed the findings and recommendations with the patient.
If applicable, a reminder letter will be sent to the patient
regarding the next appointment.

BI-RADS CATEGORY  5: Highly suggestive of malignancy.

## 2024-01-09 ENCOUNTER — Encounter: Payer: Self-pay | Admitting: Family Medicine

## 2024-01-09 ENCOUNTER — Ambulatory Visit (INDEPENDENT_AMBULATORY_CARE_PROVIDER_SITE_OTHER): Admitting: Family Medicine

## 2024-01-09 VITALS — BP 138/78 | HR 65 | Temp 98.2°F | Ht 62.0 in | Wt 116.2 lb

## 2024-01-09 DIAGNOSIS — Z1159 Encounter for screening for other viral diseases: Secondary | ICD-10-CM | POA: Diagnosis not present

## 2024-01-09 DIAGNOSIS — M8000XD Age-related osteoporosis with current pathological fracture, unspecified site, subsequent encounter for fracture with routine healing: Secondary | ICD-10-CM

## 2024-01-09 DIAGNOSIS — E7800 Pure hypercholesterolemia, unspecified: Secondary | ICD-10-CM | POA: Diagnosis not present

## 2024-01-09 LAB — LIPID PANEL
Cholesterol: 248 mg/dL — ABNORMAL HIGH (ref 0–200)
HDL: 66.5 mg/dL (ref >39.00)
LDL Cholesterol: 156 mg/dL — ABNORMAL HIGH (ref 0–99)
NonHDL: 181.44
Total CHOL/HDL Ratio: 4
Triglycerides: 126 mg/dL (ref 0.0–149.0)
VLDL: 25.2 mg/dL (ref 0.0–40.0)

## 2024-01-09 NOTE — Progress Notes (Unsigned)
   Established Patient Office Visit  Subjective   Patient ID: Katherine Donovan, female    DOB: 08-01-1945  Age: 78 y.o. MRN: [id number]  Chief Complaint  Patient presents with  . Leg Problem    Patient states bilateral leg cramps have resolved at about 90% after discontinuing Fosamax , questioned if to restart this or injectable for osteoporosis    Pt reports that since she stopped the fosamax  her muscle cramps have improved about 90%. States that she will get an occasionally get a muscle cramp but nowhere near the frequency or intensity as before. I counseled the patient on the other medications that can be prescribed for her osteoporosis, risks/benefits discussed with patient. Message sent to look at insurance coverage   HLD--       Current Outpatient Medications  Medication Instructions  . BIOTIN PO Daily  . Calcium Carbonate-Vitamin D 600-400 MG-UNIT tablet 1 tablet, Daily  . Lactobacillus Rhamnosus, GG, (RA PROBIOTIC DIGESTIVE CARE) CAPS 1 capsule, 3 times weekly  . MAGNESIUM PO 400 mg, Daily  . Misc Natural Products (JOINT SUPPORT COMPLEX PO) 1 tablet, Daily  . Multiple Vitamin (MULTI-VITAMIN) tablet 1 tablet, Daily  . tamoxifen  (NOLVADEX ) 20 mg, Oral, Daily  . VITAMIN D PO 2,000 Units, Daily    Patient Active Problem List   Diagnosis Date Noted  . Nocturnal muscle cramps 09/28/2023  . Syncope 04/22/2023  . Port-A-Cath in place 10/14/2021  . Malignant neoplasm of upper-outer quadrant of left breast in female, estrogen receptor positive (HCC) 09/18/2021  . Pure hypercholesterolemia 09/18/2015  . Age-related osteoporosis with current pathological fracture with routine healing 09/18/2015      ROS    Objective:     BP 138/78   Pulse 65   Temp 98.2 F (36.8 C) (Oral)   Ht 5' 2 (1.575 m)   Wt 116 lb 3.2 oz (52.7 kg)   SpO2 98%   BMI 21.25 kg/m  {Vitals History (Optional):23777}  Physical Exam   No results found for any visits on 01/09/24.  {Labs  (Optional):23779}  The 10-year ASCVD risk score (Arnett DK, et al., 2019) is: 20.4%    Assessment & Plan:  Need for hepatitis C screening test -     Hepatitis C antibody; Future  Pure hypercholesterolemia -     Lipid panel; Future  Age-related osteoporosis with current pathological fracture with routine healing     Return in about 5 months (around 05/25/2024) for already scheduled.    Heron CHRISTELLA Sharper, MD

## 2024-01-10 LAB — HEPATITIS C ANTIBODY: Hepatitis C Ab: NONREACTIVE

## 2024-01-11 NOTE — Assessment & Plan Note (Signed)
 Ordered new lipid panel for surveillance.

## 2024-01-11 NOTE — Assessment & Plan Note (Signed)
 Off foasamax and her muscle cramps have nearly resolved. Will attempt to prescribe Prolia or Evenity for bone remineralization. Message sent to evaluated insurance coverage

## 2024-01-12 ENCOUNTER — Ambulatory Visit: Payer: Self-pay | Admitting: Family Medicine

## 2024-01-12 DIAGNOSIS — E7800 Pure hypercholesterolemia, unspecified: Secondary | ICD-10-CM

## 2024-01-13 MED ORDER — ATORVASTATIN CALCIUM 20 MG PO TABS: 20.0000 mg | ORAL_TABLET | Freq: Every day | ORAL | 1 refills | Status: DC

## 2024-01-16 ENCOUNTER — Telehealth: Payer: Self-pay

## 2024-01-16 NOTE — Telephone Encounter (Signed)
 Left a detailed message with the information below at the patient's home number and requested she call to schedule a nurses visit for the injection.

## 2024-01-16 NOTE — Telephone Encounter (Signed)
 Pt ready for scheduling at anytime.  Estimated out-of-pocket cost due at time of visit: $0  Primary Insurance: Medicare  Secondary Insurance: AARP  Deductible: $257 out of $257  Buy/Bill   This summary of benefits is an estimation of the patient's out-of-pocket cost. Exact cost may vary based on individual plan coverage.

## 2024-01-19 ENCOUNTER — Ambulatory Visit (INDEPENDENT_AMBULATORY_CARE_PROVIDER_SITE_OTHER): Admitting: *Deleted

## 2024-01-19 DIAGNOSIS — M810 Age-related osteoporosis without current pathological fracture: Secondary | ICD-10-CM

## 2024-01-19 DIAGNOSIS — M8000XD Age-related osteoporosis with current pathological fracture, unspecified site, subsequent encounter for fracture with routine healing: Secondary | ICD-10-CM

## 2024-01-19 MED ORDER — DENOSUMAB 60 MG/ML ~~LOC~~ SOSY
60.0000 mg | PREFILLED_SYRINGE | Freq: Once | SUBCUTANEOUS | Status: AC
  Administered 2024-01-19: 60 mg via SUBCUTANEOUS

## 2024-01-19 NOTE — Progress Notes (Signed)
Per orders of Dr. Casimiro Needle, injection of Prolia 60mg  given by Johnella Moloney. Patient tolerated injection well.

## 2024-01-24 ENCOUNTER — Other Ambulatory Visit: Payer: Self-pay

## 2024-02-13 ENCOUNTER — Ambulatory Visit: Attending: General Surgery

## 2024-02-13 VITALS — Wt 116.1 lb

## 2024-02-13 DIAGNOSIS — Z483 Aftercare following surgery for neoplasm: Secondary | ICD-10-CM | POA: Insufficient documentation

## 2024-02-13 NOTE — Therapy (Signed)
 OUTPATIENT PHYSICAL THERAPY SOZO SCREENING NOTE   Patient Name: Katherine Donovan MRN: [id number] DOB:March 06, 1946, 78 y.o., female Today's Date: 02/13/2024  PCP: Ozell Heron HERO, MD REFERRING PROVIDER: Ebbie Cough, MD   PT End of Session - 02/13/24 0815     Visit Number 2   # unchanged due to screen only   PT Start Time 0814    PT Stop Time 0818    PT Time Calculation (min) 4 min    Activity Tolerance Patient tolerated treatment well    Behavior During Therapy [facility] for tasks assessed/performed          Past Medical History:  Diagnosis Date   History of bunionectomy 09/02/2020   Hyperlipidemia    Hyperlipidemia    left breast ILC 09/15/21   Osteoporosis    Personal history of chemotherapy    Personal history of radiation therapy    Rosacea    to face   Past Surgical History:  Procedure Laterality Date   BREAST LUMPECTOMY Left 2023   BREAST LUMPECTOMY WITH RADIOACTIVE SEED AND SENTINEL LYMPH NODE BIOPSY Left 02/04/2022   Procedure: LEFT BREAST SEED GUIDED LUMPECTOMY AND LEFT AXILLARY SENTINEL NODE BIOPSY;  Surgeon: Ebbie Cough, MD;  Location: [facility];  Service: General;  Laterality: Left;  LMA & PEC BLOCK   PORTACATH PLACEMENT Right 10/06/2021   Procedure: INSERTION PORT-A-CATH;  Surgeon: Ebbie Cough, MD;  Location: [facility];  Service: General;  Laterality: Right;   SIGMOIDOSCOPY  07/05/1997   TUBAL LIGATION     Patient Active Problem List   Diagnosis Date Noted   Nocturnal muscle cramps 09/28/2023   Syncope 04/22/2023   Port-A-Cath in place 10/14/2021   Malignant neoplasm of upper-outer quadrant of left breast in female, estrogen receptor positive (HCC) 09/18/2021   Pure hypercholesterolemia 09/18/2015   Age-related osteoporosis with current pathological fracture with routine healing 09/18/2015    REFERRING DIAG: left breast cancer at risk for lymphedema  THERAPY DIAG: Aftercare following surgery for  neoplasm  PERTINENT HISTORY: Patient was diagnosed on 08/21/2021 with 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 It measures 3 cm and is located in the upper outer quadrant. It is ER positive, PR negative and HER2 positive with a Ki67 of 15%. Neoadjuvant chemotherapy TCHP. Left lumpectomy on 02/04/22 with 6 negative lymph nodes. Will be having radiation. Has osteoporosis.     PRECAUTIONS: left UE Lymphedema risk, None  SUBJECTIVE: Pt returns for her last 3 month L-Dex screen.   PAIN:  Are you having pain? No  SOZO SCREENING: Patient was assessed today using the SOZO machine to determine the lymphedema index score. This was compared to her baseline score. It was determined that she is within the recommended range when compared to her baseline and no further action is needed at this time. She will continue SOZO screenings. These are done every 3 months for 2 years post operatively followed by every 6 months for 2 years, and then annually.   L-DEX FLOWSHEETS - 02/13/24 0800       L-DEX LYMPHEDEMA SCREENING   Measurement Type Unilateral    L-DEX MEASUREMENT EXTREMITY Upper Extremity    POSITION  Standing    DOMINANT SIDE Right    At Risk Side Left    BASELINE SCORE (UNILATERAL) 2.6    L-DEX SCORE (UNILATERAL) 5    VALUE CHANGE (UNILAT) 2.4         P: Transition to 6 months.    Aden Berwyn Caldron,  PTA 02/13/2024, 8:17 AM

## 2024-02-14 ENCOUNTER — Other Ambulatory Visit: Payer: Self-pay

## 2024-03-21 ENCOUNTER — Other Ambulatory Visit: Payer: Self-pay

## 2024-03-21 DIAGNOSIS — M810 Age-related osteoporosis without current pathological fracture: Secondary | ICD-10-CM

## 2024-03-21 MED ORDER — DENOSUMAB 60 MG/ML ~~LOC~~ SOSY
60.0000 mg | PREFILLED_SYRINGE | SUBCUTANEOUS | Status: AC
  Administered 2024-07-18: 60 mg via SUBCUTANEOUS

## 2024-03-21 NOTE — Progress Notes (Signed)
 Pt on bone density report. Order placed for PA.

## 2024-05-13 ENCOUNTER — Other Ambulatory Visit: Payer: Self-pay

## 2024-05-24 ENCOUNTER — Ambulatory Visit: Payer: Self-pay | Admitting: Family Medicine

## 2024-05-24 ENCOUNTER — Encounter: Payer: Self-pay | Admitting: Family Medicine

## 2024-05-24 ENCOUNTER — Ambulatory Visit (INDEPENDENT_AMBULATORY_CARE_PROVIDER_SITE_OTHER): Admitting: Family Medicine

## 2024-05-24 VITALS — BP 166/85 | HR 59 | Temp 97.8°F | Ht 62.0 in | Wt 117.4 lb

## 2024-05-24 DIAGNOSIS — R030 Elevated blood-pressure reading, without diagnosis of hypertension: Secondary | ICD-10-CM

## 2024-05-24 DIAGNOSIS — M8000XD Age-related osteoporosis with current pathological fracture, unspecified site, subsequent encounter for fracture with routine healing: Secondary | ICD-10-CM

## 2024-05-24 DIAGNOSIS — E7800 Pure hypercholesterolemia, unspecified: Secondary | ICD-10-CM | POA: Diagnosis not present

## 2024-05-24 LAB — HEPATIC FUNCTION PANEL
ALT: 20 U/L (ref 0–35)
AST: 30 U/L (ref 0–37)
Albumin: 4.2 g/dL (ref 3.5–5.2)
Alkaline Phosphatase: 25 U/L — ABNORMAL LOW (ref 39–117)
Bilirubin, Direct: 0.1 mg/dL (ref 0.0–0.3)
Total Bilirubin: 0.7 mg/dL (ref 0.2–1.2)
Total Protein: 6.4 g/dL (ref 6.0–8.3)

## 2024-05-24 LAB — LIPID PANEL
Cholesterol: 186 mg/dL (ref 0–200)
HDL: 64.8 mg/dL (ref >39.00)
LDL Cholesterol: 98 mg/dL (ref 0–99)
NonHDL: 121.33
Total CHOL/HDL Ratio: 3
Triglycerides: 115 mg/dL (ref 0.0–149.0)
VLDL: 23 mg/dL (ref 0.0–40.0)

## 2024-05-24 NOTE — Progress Notes (Signed)
 Established Patient Office Visit  Subjective   Patient ID: Katherine Donovan, female    DOB: 14-Nov-1945  Age: 78 y.o. MRN: [id number]  Chief Complaint  Patient presents with   Medical Management of Chronic Issues    HPI Discussed the use of AI scribe software for clinical note transcription with the patient, who gave verbal consent to proceed.  History of Present Illness   Katherine Donovan is a 78 year old female who presents for a follow-up visit regarding osteoporosis and hyperlipidemia.  She has been taking her cholesterol medication for a couple of months without experiencing muscle cramps, body aches, or dizziness. She received her first Prolia  injection for osteoporosis without adverse effects. Her leg cramps have mostly subsided with increased water intake and magnesium supplementation.  Her blood pressure is typically high during office visits, but at home, readings are between 100 to 125 mmHg. She monitors her blood pressure every Friday. Her husband sometimes comments on her low readings at home.  She regularly visits the cancer center every six months. Her last bone density test was in 2024, and she is due in 2026. She has received her RSV vaccine but does not get flu shots. No new symptoms or problems.       Current Outpatient Medications  Medication Instructions   atorvastatin  (LIPITOR) 20 mg, Oral, Daily   BIOTIN PO Daily   Calcium  Carbonate-Vitamin D 600-400 MG-UNIT tablet 1 tablet, Daily   Lactobacillus Rhamnosus, GG, (RA PROBIOTIC DIGESTIVE CARE) CAPS 1 capsule, 3 times weekly   MAGNESIUM PO 400 mg, Daily   Misc Natural Products (JOINT SUPPORT COMPLEX PO) 1 tablet, Daily   Multiple Vitamin (MULTI-VITAMIN) tablet 1 tablet, Daily   tamoxifen  (NOLVADEX ) 20 mg, Oral, Daily   VITAMIN D PO 2,000 Units, Daily    Patient Active Problem List   Diagnosis Date Noted   Nocturnal muscle cramps 09/28/2023   Syncope 04/22/2023   Port-A-Cath in place 10/14/2021    Malignant neoplasm of upper-outer quadrant of left breast in female, estrogen receptor positive (HCC) 09/18/2021   Pure hypercholesterolemia 09/18/2015   Age-related osteoporosis with current pathological fracture with routine healing 09/18/2015     Review of Systems  All other systems reviewed and are negative.     Objective:     BP (!) 166/85 Comment: repeated by PCP--jaf  Pulse (!) 59   Temp 97.8 F (36.6 C) (Oral)   Ht 5' 2 (1.575 m)   Wt 117 lb 6.4 oz (53.3 kg)   SpO2 96%   BMI 21.47 kg/m    Physical Exam Vitals reviewed.  Constitutional:      Appearance: Normal appearance. She is well-groomed and normal weight.  Cardiovascular:     Rate and Rhythm: Normal rate and regular rhythm.     Heart sounds: S1 normal and S2 normal.  Pulmonary:     Effort: Pulmonary effort is normal.     Breath sounds: Normal air entry.  Neurological:     Mental Status: She is alert and oriented to person, place, and time. Mental status is at baseline.     Gait: Gait is intact.  Psychiatric:        Mood and Affect: Mood and affect normal.        Speech: Speech normal.        Behavior: Behavior normal.      No results found for any visits on 05/24/24.    The 10-year ASCVD risk score (Arnett DK, et al.,  2019) is: 28.4%    Assessment & Plan:  Pure hypercholesterolemia -     Lipid panel; Future -     Hepatic function panel; Future  White coat syndrome with high blood pressure but without hypertension  Age-related osteoporosis with current pathological fracture with routine healing   Assessment and Plan    Pure hypercholesterolemia Currently on cholesterol medication with no reported side effects such as muscle cramps, body aches, or dizziness. Plan to assess improvement in cholesterol levels and adjust medication dosage if necessary. - Ordered cholesterol panel and liver function tests - Will contact via MyChart with lab results and medication adjustment if  needed  Osteoporosis Recently started on Prolia  with no adverse effects. Bone density test scheduled but deemed unnecessary this year due to recent initiation of Prolia . Plan to reassess bone density in 2027 after at least 1.5 to 2 years on Prolia . - Canceled bone density test scheduled for this year - Continue Prolia  as prescribed  White coat hypertension Elevated blood pressure readings in clinical settings, likely due to anxiety (white coat syndrome). Home blood pressure readings are within normal range. Plan to verify accuracy of home blood pressure cuff at next visit. - Bring home blood pressure cuff to next visit for comparison - Continue home blood pressure monitoring  Leg cramps Experiencing leg cramps at night, which have subsided with increased water intake and stress management. Currently taking magnesium, which appears to be effective. - Continue current management with increased water intake and magnesium supplementation        Return in about 6 months (around 11/21/2024) for HLD, BP hceck, please bring home BP cuff.    Heron CHRISTELLA Sharper, MD

## 2024-05-24 NOTE — Patient Instructions (Signed)
 Bring home BP cuff to your next visit

## 2024-07-03 ENCOUNTER — Encounter: Payer: Self-pay | Admitting: Family Medicine

## 2024-07-06 ENCOUNTER — Telehealth: Payer: Self-pay | Admitting: *Deleted

## 2024-07-06 ENCOUNTER — Ambulatory Visit

## 2024-07-06 VITALS — BP 118/64 | HR 60 | Temp 96.2°F | Ht 62.0 in | Wt 119.4 lb

## 2024-07-06 DIAGNOSIS — Z0000 Encounter for general adult medical examination without abnormal findings: Secondary | ICD-10-CM | POA: Diagnosis not present

## 2024-07-06 DIAGNOSIS — E7800 Pure hypercholesterolemia, unspecified: Secondary | ICD-10-CM

## 2024-07-06 MED ORDER — ATORVASTATIN CALCIUM 20 MG PO TABS: 20.0000 mg | ORAL_TABLET | Freq: Every day | ORAL | 1 refills | Status: DC

## 2024-07-06 NOTE — Progress Notes (Signed)
 "  Chief Complaint  Patient presents with   Medicare Wellness     Subjective:   Katherine Donovan is a 79 y.o. female who presents for a Medicare Annual Wellness Visit.  Visit info / Clinical Intake: Medicare Wellness Visit Type:: Subsequent Annual Wellness Visit Persons participating in visit and providing information:: patient Medicare Wellness Visit Mode:: In-person (required for WTM) Interpreter Needed?: No Pre-visit prep was completed: yes AWV questionnaire completed by patient prior to visit?: yes Date:: 07/03/24 Living arrangements:: lives with spouse/significant other Patient's Overall Health Status Rating: very good Typical amount of pain: none Does pain affect daily life?: no Are you currently prescribed opioids?: no  Dietary Habits and Nutritional Risks How many meals a day?: 3 Eats fruit and vegetables daily?: yes Most meals are obtained by: preparing own meals In the last 2 weeks, have you had any of the following?: none Diabetic:: no  Functional Status Activities of Daily Living (to include ambulation/medication): Independent Ambulation: Independent with device- listed below Home Assistive Devices/Equipment: Eyeglasses; Dentures (specify type) Medication Administration: Independent Home Management (perform basic housework or laundry): Independent Manage your own finances?: yes Primary transportation is: driving Concerns about vision?: no *vision screening is required for WTM* Concerns about hearing?: no  Fall Screening Falls in the past year?: 0 Number of falls in past year: 0 Was there an injury with Fall?: 0 Fall Risk Category Calculator: 0 Patient Fall Risk Level: Low Fall Risk  Fall Risk Patient at Risk for Falls Due to: No Fall Risks Fall risk Follow up: Falls evaluation completed  Home and Transportation Safety: All rugs have non-skid backing?: yes All stairs or steps have railings?: yes Grab bars in the bathtub or shower?: yes Have non-skid  surface in bathtub or shower?: yes Good home lighting?: yes Regular seat belt use?: yes Hospital stays in the last year:: no  Cognitive Assessment Difficulty concentrating, remembering, or making decisions? : no Will 6CIT or Mini Cog be Completed: yes What year is it?: 0 points What month is it?: 0 points Give patient an address phrase to remember (5 components): [address] [place]  About what time is it?: 0 points Count backwards from 20 to 1: 0 points Say the months of the year in reverse: 0 points Repeat the address phrase from earlier: 0 points 6 CIT Score: 0 points  Advance Directives (For Healthcare) Does Patient Have a Medical Advance Directive?: Yes Does patient want to make changes to medical advance directive?: No - Patient declined Type of Advance Directive: Healthcare Power of [place]; Living will Copy of Healthcare Power of Attorney in Chart?: Yes - validated most recent copy scanned in chart (See row information) Copy of Living Will in Chart?: Yes - validated most recent copy scanned in chart (See row information)  Reviewed/Updated  Reviewed/Updated: Reviewed All (Medical, Surgical, Family, Medications, Allergies, Care Teams, Patient Goals)    Allergies (verified) Patient has no known allergies.   Current Medications (verified) Outpatient Encounter Medications as of 07/06/2024  Medication Sig   BIOTIN PO Take by mouth daily.   Calcium  Carbonate-Vitamin D 600-400 MG-UNIT tablet Take 1 tablet by mouth daily.     Lactobacillus Rhamnosus, GG, (RA PROBIOTIC DIGESTIVE CARE) CAPS Take 1 capsule by mouth 3 (three) times a week.   MAGNESIUM PO Take 400 mg by mouth daily.   Misc Natural Products (JOINT SUPPORT COMPLEX PO) Take 1 tablet by mouth daily. [employer] Joints-Cartilage-Bone Triple Action Joint Health; Undenatured Type II Collagen   Multiple Vitamin (MULTI-VITAMIN)  tablet Take 1 tablet by mouth daily.   tamoxifen  (NOLVADEX ) 20 MG tablet Take 1 tablet (20 mg  total) by mouth daily.   VITAMIN D PO Take 2,000 Units by mouth daily.   [DISCONTINUED] atorvastatin  (LIPITOR) 20 MG tablet Take 1 tablet (20 mg total) by mouth daily.   [DISCONTINUED] prochlorperazine  (COMPAZINE ) 10 MG tablet Take 1 tablet (10 mg total) by mouth every 6 (six) hours as needed (Nausea or vomiting).   Facility-Administered Encounter Medications as of 07/06/2024  Medication   [START ON 07/23/2024] denosumab  (PROLIA ) injection 60 mg    History: Past Medical History:  Diagnosis Date   History of bunionectomy 09/02/2020   Hyperlipidemia    Hyperlipidemia    left breast ILC 09/15/21   Osteoporosis    Personal history of chemotherapy    Personal history of radiation therapy    Rosacea    to face   Past Surgical History:  Procedure Laterality Date   BREAST LUMPECTOMY Left 2023   BREAST LUMPECTOMY WITH RADIOACTIVE SEED AND SENTINEL LYMPH NODE BIOPSY Left 02/04/2022   Procedure: LEFT BREAST SEED GUIDED LUMPECTOMY AND LEFT AXILLARY SENTINEL NODE BIOPSY;  Surgeon: Ebbie Cough, MD;  Location: [facility];  Service: General;  Laterality: Left;  LMA & PEC BLOCK   PORTACATH PLACEMENT Right 10/06/2021   Procedure: INSERTION PORT-A-CATH;  Surgeon: Ebbie Cough, MD;  Location: [facility];  Service: General;  Laterality: Right;   SIGMOIDOSCOPY  07/05/1997   TUBAL LIGATION     Family History  Problem Relation Age of Onset   Melanoma Mother        or other skin cancer; dx after 50; no systemic therapy needed   Hypertension Father    Heart attack Father    Other Sister 70       benign breast mass   Obesity Sister    Other Sister 67       benign breast mass   Cancer Paternal Uncle        unknown type; mets; d. late 74s   Colon cancer Neg Hx    Social History   Occupational History   Not on file  Tobacco Use   Smoking status: Former    Types: Cigarettes   Smokeless tobacco: Never   Tobacco comments:    Never really liked or began a  habit.  Vaping Use   Vaping status: Never Used  Substance and Sexual Activity   Alcohol use: Yes    Alcohol/week: 3.0 - 5.0 standard drinks of alcohol    Types: 3 - 5 Glasses of wine per week    Comment: wine social   Drug use: No   Sexual activity: Yes    Birth control/protection: None, Post-menopausal    Comment: Age/tubes tied   Tobacco Counseling Counseling given: No Tobacco comments: Never really liked or began a habit.  SDOH Screenings   Food Insecurity: No Food Insecurity (07/06/2024)  Housing: Unknown (07/06/2024)  Transportation Needs: No Transportation Needs (07/06/2024)  Utilities: Not At Risk (07/06/2024)  Alcohol Screen: Low Risk (09/16/2023)  Depression (PHQ2-9): Low Risk (07/06/2024)  Financial Resource Strain: Low Risk (05/21/2024)  Physical Activity: Insufficiently Active (07/06/2024)  Social Connections: Patient Declined (07/06/2024)  Stress: No Stress Concern Present (07/06/2024)  Tobacco Use: Medium Risk (07/06/2024)  Health Literacy: Adequate Health Literacy (07/06/2024)   See flowsheets for full screening details  Depression Screen PHQ 2 & 9 Depression Scale- Over the past 2 weeks, how often have you been bothered by  any of the following problems? Little interest or pleasure in doing things: 0 Feeling down, depressed, or hopeless (PHQ Adolescent also includes...irritable): 0 PHQ-2 Total Score: 0 Trouble falling or staying asleep, or sleeping too much: 0 Feeling tired or having little energy: 0 Poor appetite or overeating (PHQ Adolescent also includes...weight loss): 0 Feeling bad about yourself - or that you are a failure or have let yourself or your family down: 0 Trouble concentrating on things, such as reading the newspaper or watching television (PHQ Adolescent also includes...like school work): 0 Moving or speaking so slowly that other people could have noticed. Or the opposite - being so fidgety or restless that you have been moving around a lot more than usual:  0 Thoughts that you would be better off dead, or of hurting yourself in some way: 0 PHQ-9 Total Score: 0     Goals Addressed               This Visit's Progress     Remain active (pt-stated)               Objective:    Today's Vitals   07/06/24 0802  BP: 118/64  Pulse: 60  Temp: (!) 96.2 F (35.7 C)  TempSrc: Oral  SpO2: 96%  Weight: 119 lb 6.4 oz (54.2 kg)  Height: 5' 2 (1.575 m)   Body mass index is 21.84 kg/m.  Hearing/Vision screen Hearing Screening - Comments:: Denies hearing difficulties   Vision Screening - Comments:: Wears rx glasses - up to date with routine eye exams with  [facility] Immunizations and Health Maintenance Health Maintenance  Topic Date Due   COVID-19 Vaccine (7 - 2025-26 season) 03/05/2024   Influenza Vaccine  10/02/2024 (Originally 02/03/2024)   Mammogram  09/06/2024   Medicare Annual Wellness (AWV)  07/06/2025   DTaP/Tdap/Td (4 - Td or Tdap) 01/05/2028   Pneumococcal Vaccine: 50+ Years  Completed   Bone Density Scan  Completed   Hepatitis C Screening  Completed   Zoster Vaccines- Shingrix  Completed   Meningococcal B Vaccine  Aged Out   Colonoscopy  Discontinued        Assessment/Plan:  This is a routine wellness examination for Lita.  Patient Care Team: Ozell Heron HERO, MD as PCP - General (Family Medicine) Tyree Nanetta SAILOR, RN as Oncology Nurse Navigator Ebbie Cough, MD as Consulting Physician (General Surgery) Lanny Callander, MD as Consulting Physician (Hematology) Dewey Rush, MD as Consulting Physician (Radiation Oncology) Burton, Lacie K, NP as Nurse Practitioner (Nurse Practitioner)  I have personally reviewed and noted the following in the patients chart:   Medical and social history Use of alcohol, tobacco or illicit drugs  Current medications and supplements including opioid prescriptions. Functional ability and status Nutritional status Physical activity Advanced directives List of other  physicians Hospitalizations, surgeries, and ER visits in previous 12 months Vitals Screenings to include cognitive, depression, and falls Referrals and appointments  No orders of the defined types were placed in this encounter.  In addition, I have reviewed and discussed with patient certain preventive protocols, quality metrics, and best practice recommendations. A written personalized care plan for preventive services as well as general preventive health recommendations were provided to patient.   Rojelio LELON Blush, LPN   02/09/7972   Return in 1 year (on 07/09/2025).  After Visit Summary: (In Person-Declined) Patient declined AVS at this time.  Nurse Notes: No voiced or noted concerns at this time "

## 2024-07-06 NOTE — Telephone Encounter (Signed)
 Patient was seen in the office today for AWV with [facility] and requested a refill on Atorvastatin .  Patient is aware refills were sent to [facility].

## 2024-07-06 NOTE — Patient Instructions (Addendum)
 Ms. Trippett,  Thank you for taking the time for your Medicare Wellness Visit. I appreciate your continued commitment to your health goals. Please review the care plan we discussed, and feel free to reach out if I can assist you further.  Please note that Annual Wellness Visits do not include a physical exam. Some assessments may be limited, especially if the visit was conducted virtually. If needed, we may recommend an in-person follow-up with your provider.  Ongoing Care Seeing your primary care provider every 3 to 6 months helps us  monitor your health and provide consistent, personalized care.   Referrals If a referral was made during today's visit and you haven't received any updates within two weeks, please contact the referred provider directly to check on the status.  Recommended Screenings:  Health Maintenance  Topic Date Due   COVID-19 Vaccine (7 - 2025-26 season) 03/05/2024   Flu Shot  10/02/2024*   Breast Cancer Screening  09/06/2024   Medicare Annual Wellness Visit  07/06/2025   DTaP/Tdap/Td vaccine (4 - Td or Tdap) 01/05/2028   Pneumococcal Vaccine for age over 41  Completed   Osteoporosis screening with Bone Density Scan  Completed   Hepatitis C Screening  Completed   Zoster (Shingles) Vaccine  Completed   Meningitis B Vaccine  Aged Out   Colon Cancer Screening  Discontinued  *Topic was postponed. The date shown is not the original due date.       07/06/2024    8:29 AM  Advanced Directives  Does Patient Have a Medical Advance Directive? Yes  Type of Estate Agent of [place];Living will  Does patient want to make changes to medical advance directive? No - Patient declined  Copy of Healthcare Power of Attorney in Chart? Yes - validated most recent copy scanned in chart (See row information)    Vision: Annual vision screenings are recommended for early detection of glaucoma, cataracts, and diabetic retinopathy. These exams can also reveal signs of  chronic conditions such as diabetes and high blood pressure.  Dental: Annual dental screenings help detect early signs of oral cancer, gum disease, and other conditions linked to overall health, including heart disease and diabetes.  Please see the attached documents for additional preventive care recommendations.

## 2024-07-09 ENCOUNTER — Telehealth: Payer: Self-pay

## 2024-07-09 NOTE — Telephone Encounter (Signed)
 Prolia VOB initiated via [email or web address]  Next Prolia inj DUE: 07/22/23

## 2024-07-10 ENCOUNTER — Other Ambulatory Visit: Payer: Self-pay | Admitting: Family Medicine

## 2024-07-10 ENCOUNTER — Encounter: Payer: Self-pay | Admitting: Hematology

## 2024-07-10 ENCOUNTER — Other Ambulatory Visit (HOSPITAL_COMMUNITY): Payer: Self-pay

## 2024-07-10 DIAGNOSIS — E7800 Pure hypercholesterolemia, unspecified: Secondary | ICD-10-CM

## 2024-07-10 NOTE — Telephone Encounter (Signed)
 Pt ready for scheduling for PROLIA  on or after : 07/22/23  Option# 1: Buy/Bill (Office supplied medication)  Out-of-pocket cost due at time of clinic visit: $0  Number of injection/visits approved: ---  Primary: MEDICARE Prolia  co-insurance: 0% Admin fee co-insurance: 0%  Secondary: [employer]-MEDSUP Prolia  co-insurance: covers the Medicare Part B deductible, co-insurance and 100% of the excess charges Admin fee co-insurance:   Medical Benefit Details: Date Benefits were checked: 07/09/24 Deductible: $0 Met of $283 Required/ Coinsurance: 0%/ Admin Fee: 0%  Prior Auth: N/A PA# Expiration Date:   # of doses approved: ----------------------------------------------------------------------- Option# 2- Med Obtained from pharmacy: [facility]REF[facility] FOR PHARMACY BENEFIT  Pharmacy benefit: Copay $972.55 (Paid to pharmacy) Admin Fee: 0% (Pay at clinic)  Prior Auth: N/A PA# Expiration Date:   # of doses approved:   If patient wants fill through the pharmacy benefit please send prescription to: [facility], and include estimated need by date in rx notes. Pharmacy will ship medication directly to the office.  Patient NOT eligible for Prolia  Copay Card. Copay Card can make patient's cost as little as $25. Link to apply: https://[email or web address].com/copay  ** This summary of benefits is an estimation of the patient's out-of-pocket cost. Exact cost may very based on individual plan coverage.

## 2024-07-10 NOTE — Telephone Encounter (Signed)
 SABRA

## 2024-07-16 ENCOUNTER — Telehealth: Payer: Self-pay | Admitting: Family Medicine

## 2024-07-16 NOTE — Telephone Encounter (Signed)
 Patient is ready to Prolia  scheduling on or after 07/11/2024. Please contact patient for scheduling and advise of $0 copay.

## 2024-07-16 NOTE — Telephone Encounter (Signed)
 Copied from CRM #[id number]. Topic: General - Other >> Jul 16, 2024  8:36 AM Wess RAMAN wrote: Reason for CRM: Patient missed call on Friday around noon and was told she would receive a call back and hasn't yet. No details left and no voicemail  Callback #: [phone]

## 2024-07-17 NOTE — Telephone Encounter (Signed)
 Left a detailed message with the information below at the patient's cell number and advised she call to schedule a nurses visit for the injection.

## 2024-07-17 NOTE — Telephone Encounter (Signed)
 See prior phone note.

## 2024-07-18 ENCOUNTER — Ambulatory Visit (INDEPENDENT_AMBULATORY_CARE_PROVIDER_SITE_OTHER): Admitting: *Deleted

## 2024-07-18 ENCOUNTER — Ambulatory Visit

## 2024-07-18 DIAGNOSIS — M810 Age-related osteoporosis without current pathological fracture: Secondary | ICD-10-CM

## 2024-07-18 DIAGNOSIS — M8000XD Age-related osteoporosis with current pathological fracture, unspecified site, subsequent encounter for fracture with routine healing: Secondary | ICD-10-CM

## 2024-07-18 MED ORDER — DENOSUMAB 60 MG/ML ~~LOC~~ SOSY: 60.0000 mg | PREFILLED_SYRINGE | SUBCUTANEOUS | Status: AC

## 2024-07-18 NOTE — Addendum Note (Signed)
 Addended by: DIONISIO COLLIE PARAS on: 07/18/2024 10:47 AM   Modules accepted: Orders

## 2024-07-18 NOTE — Progress Notes (Signed)
 Katherine Donovan is in office today for a nurse visit for Prolia  Injection per Ozell Heron HERO, MD order. Medication given by Idell Arlean Butler, CMA. Patient Injection was given in the  Left arm. Patient tolerated injection well.

## 2024-08-03 ENCOUNTER — Other Ambulatory Visit: Payer: Self-pay

## 2024-08-03 DIAGNOSIS — C50412 Malignant neoplasm of upper-outer quadrant of left female breast: Secondary | ICD-10-CM

## 2024-08-06 ENCOUNTER — Inpatient Hospital Stay: Admitting: Nurse Practitioner

## 2024-08-06 ENCOUNTER — Inpatient Hospital Stay

## 2024-08-13 ENCOUNTER — Inpatient Hospital Stay

## 2024-08-13 ENCOUNTER — Inpatient Hospital Stay: Admitting: Nurse Practitioner

## 2024-08-13 ENCOUNTER — Ambulatory Visit: Attending: General Surgery

## 2024-09-07 ENCOUNTER — Encounter

## 2025-07-09 ENCOUNTER — Ambulatory Visit
